# Patient Record
Sex: Female | Born: 1960
Health system: Southern US, Community
[De-identification: ages and names within clinical notes are randomized; demographics above are authoritative.]

## PROBLEM LIST (undated history)

## (undated) DIAGNOSIS — M1990 Unspecified osteoarthritis, unspecified site: Secondary | ICD-10-CM

## (undated) DIAGNOSIS — J449 Chronic obstructive pulmonary disease, unspecified: Secondary | ICD-10-CM

## (undated) DIAGNOSIS — E559 Vitamin D deficiency, unspecified: Secondary | ICD-10-CM

## (undated) DIAGNOSIS — E119 Type 2 diabetes mellitus without complications: Secondary | ICD-10-CM

## (undated) DIAGNOSIS — K219 Gastro-esophageal reflux disease without esophagitis: Secondary | ICD-10-CM

## (undated) DIAGNOSIS — D869 Sarcoidosis, unspecified: Secondary | ICD-10-CM

## (undated) DIAGNOSIS — J45909 Unspecified asthma, uncomplicated: Secondary | ICD-10-CM

## (undated) DIAGNOSIS — G5600 Carpal tunnel syndrome, unspecified upper limb: Secondary | ICD-10-CM

## (undated) DIAGNOSIS — I499 Cardiac arrhythmia, unspecified: Secondary | ICD-10-CM

## (undated) DIAGNOSIS — M48061 Spinal stenosis, lumbar region without neurogenic claudication: Secondary | ICD-10-CM

## (undated) DIAGNOSIS — G4730 Sleep apnea, unspecified: Secondary | ICD-10-CM

## (undated) DIAGNOSIS — I38 Endocarditis, valve unspecified: Secondary | ICD-10-CM

## (undated) DIAGNOSIS — E0590 Thyrotoxicosis, unspecified without thyrotoxic crisis or storm: Secondary | ICD-10-CM

## (undated) DIAGNOSIS — E785 Hyperlipidemia, unspecified: Secondary | ICD-10-CM

## (undated) DIAGNOSIS — E0500 Thyrotoxicosis with diffuse goiter without thyrotoxic crisis or storm: Secondary | ICD-10-CM

## (undated) HISTORY — DX: Unspecified osteoarthritis, unspecified site: M19.90

## (undated) HISTORY — DX: Carpal tunnel syndrome, unspecified upper limb: G56.00

## (undated) HISTORY — DX: Thyrotoxicosis with diffuse goiter without thyrotoxic crisis or storm: E05.00

## (undated) HISTORY — DX: Vitamin D deficiency, unspecified: E55.9

## (undated) HISTORY — DX: Hyperlipidemia, unspecified: E78.5

## (undated) HISTORY — DX: Unspecified asthma, uncomplicated: J45.909

## (undated) HISTORY — DX: Type 2 diabetes mellitus without complications: E11.9

## (undated) HISTORY — PX: TUBAL LIGATION: SHX77

## (undated) HISTORY — DX: Spinal stenosis, lumbar region without neurogenic claudication: M48.061

## (undated) HISTORY — DX: Endocarditis, valve unspecified: I38

## (undated) HISTORY — PX: OTHER SURGICAL HISTORY: SHX169

## (undated) HISTORY — PX: ULNAR NERVE REPAIR: SHX2594

## (undated) HISTORY — PX: SHOULDER ARTHROSCOPY: SHX128

## (undated) HISTORY — DX: Thyrotoxicosis, unspecified without thyrotoxic crisis or storm: E05.90

## (undated) SURGERY — Surgical Case
Anesthesia: *Unknown

---

## 1997-08-25 ENCOUNTER — Ambulatory Visit (HOSPITAL_COMMUNITY): Admission: RE | Admit: 1997-08-25 | Discharge: 1997-08-25 | Payer: Self-pay | Admitting: *Deleted

## 1997-10-25 ENCOUNTER — Other Ambulatory Visit: Admission: RE | Admit: 1997-10-25 | Discharge: 1997-10-25 | Payer: Self-pay | Admitting: Obstetrics and Gynecology

## 1998-10-10 ENCOUNTER — Other Ambulatory Visit: Admission: RE | Admit: 1998-10-10 | Discharge: 1998-10-10 | Payer: Self-pay | Admitting: *Deleted

## 2000-01-09 ENCOUNTER — Other Ambulatory Visit: Admission: RE | Admit: 2000-01-09 | Discharge: 2000-01-09 | Payer: Self-pay | Admitting: *Deleted

## 2000-04-06 ENCOUNTER — Encounter: Admission: RE | Admit: 2000-04-06 | Discharge: 2000-04-06 | Payer: Self-pay | Admitting: *Deleted

## 2000-04-06 ENCOUNTER — Encounter: Payer: Self-pay | Admitting: *Deleted

## 2000-05-19 ENCOUNTER — Ambulatory Visit (HOSPITAL_BASED_OUTPATIENT_CLINIC_OR_DEPARTMENT_OTHER): Admission: RE | Admit: 2000-05-19 | Discharge: 2000-05-19 | Payer: Self-pay | Admitting: Orthopaedic Surgery

## 2001-01-05 ENCOUNTER — Encounter: Admission: RE | Admit: 2001-01-05 | Discharge: 2001-01-05 | Payer: Self-pay | Admitting: Orthopedic Surgery

## 2001-01-05 ENCOUNTER — Encounter: Payer: Self-pay | Admitting: Orthopedic Surgery

## 2001-07-15 ENCOUNTER — Other Ambulatory Visit: Admission: RE | Admit: 2001-07-15 | Discharge: 2001-07-15 | Payer: Self-pay | Admitting: *Deleted

## 2001-08-31 ENCOUNTER — Ambulatory Visit (HOSPITAL_BASED_OUTPATIENT_CLINIC_OR_DEPARTMENT_OTHER): Admission: RE | Admit: 2001-08-31 | Discharge: 2001-08-31 | Payer: Self-pay | Admitting: Orthopedic Surgery

## 2002-12-21 ENCOUNTER — Encounter: Admission: RE | Admit: 2002-12-21 | Discharge: 2003-03-21 | Payer: Self-pay | Admitting: Family Medicine

## 2007-03-24 ENCOUNTER — Encounter: Admission: RE | Admit: 2007-03-24 | Discharge: 2007-03-24 | Payer: Self-pay | Admitting: Family Medicine

## 2008-09-11 ENCOUNTER — Other Ambulatory Visit: Admission: RE | Admit: 2008-09-11 | Discharge: 2008-09-11 | Payer: Self-pay | Admitting: Family Medicine

## 2010-07-21 ENCOUNTER — Encounter: Payer: Self-pay | Admitting: Family Medicine

## 2010-11-15 NOTE — Op Note (Signed)
[place]  Patient:    Lindsey Copeland, Lindsey Copeland Visit Number: [id number] MRN: [id number]          Service Type: DSU Location: [facility] Attending Physician:  Ronne Binning Dictated by:   Nicki Reaper, M.D. Proc. Date: 08/31/01 Admit Date:  08/31/2001                             Operative Report  PREOPERATIVE DIAGNOSIS:  Tardy ulnar nerve palsy, right arm.  POSTOPERATIVE DIAGNOSIS:  Tardy ulnar nerve palsy, right arm.  OPERATION:  Subcutaneous transposition, right ulnar nerve.  SURGEON:  Nicki Reaper, M.D.  ASSISTANT:  Joaquin Courts, R.N.  ANESTHESIA:  General.  ANESTHESIOLOGIST:  Cliffton Asters. Ivin Booty, M.D.  HISTORY:  The patient is a 50 year old female with a history of ulnar neuropathy on her left elbow.  This has not responded to conservative treatment.  DESCRIPTION OF PROCEDURE:  The patient was brought to the operating room where a general anesthetic was carried out without difficulty.  She was prepped and draped using Betadine scrubbing solution with the right arm free, supine position.  A sterile tourniquet placed high on the arm was inflated to 250 mmHg.  A curvilinear incision was made over the medial epicondyle and carried down through subcutaneous tissue.  Bleeders were electrocauterized. The dissection was carried down to the ulnar nerve, protecting the cutaneous branches.  The nerve was found to be extremely adherent to the fascia and muscle proximally.  With blunt and sharp dissection, this was dissected free, a fasciotomy performed to the flexor carpi ulnaris.  The medial intermuscular septum was incised, left attached at the epicondyle.  The nerve and vessels were then anteriorly transposed.  A sling was fashioned from a portion of fascia to the flexor carpi ulnaris and this was used along with the medial intermuscular septum to create a sling to hold the nerve anterior to the epicondyle.  The wound was copiously irrigated with  saline.  The subcutaneous tissue was closed with interrupted 4-0 Vicryl and the skin with a subcuticular 3-0 Monocryl suture.  Steri-Strips were applied and sterile compressive dressing and long arm splint applied.  The patient tolerated the procedure well and was taken to the recovery room for observation in satisfactory condition.  She is discharged home to return to the [facility] in one week on Vicodin and Keflex. Dictated by:   Nicki Reaper, M.D. Attending Physician:  Ronne Binning DD:  08/31/01 TD:  08/31/01 Job: [id number]

## 2010-12-16 ENCOUNTER — Other Ambulatory Visit (HOSPITAL_COMMUNITY)
Admission: RE | Admit: 2010-12-16 | Discharge: 2010-12-16 | Disposition: A | Discharge status: Home / Self Care | Payer: PRIVATE HEALTH INSURANCE | Source: Ambulatory Visit | Attending: Family Medicine | Admitting: Family Medicine

## 2010-12-16 ENCOUNTER — Other Ambulatory Visit: Payer: Self-pay | Admitting: Family Medicine

## 2010-12-16 DIAGNOSIS — Z124 Encounter for screening for malignant neoplasm of cervix: Secondary | ICD-10-CM | POA: Insufficient documentation

## 2010-12-17 ENCOUNTER — Other Ambulatory Visit: Payer: Self-pay | Admitting: Family Medicine

## 2010-12-17 DIAGNOSIS — Z1231 Encounter for screening mammogram for malignant neoplasm of breast: Secondary | ICD-10-CM

## 2010-12-30 ENCOUNTER — Ambulatory Visit
Admission: RE | Admit: 2010-12-30 | Discharge: 2010-12-30 | Disposition: A | Discharge status: Home / Self Care | Payer: PRIVATE HEALTH INSURANCE | Source: Ambulatory Visit | Attending: Family Medicine | Admitting: Family Medicine

## 2010-12-30 DIAGNOSIS — Z1231 Encounter for screening mammogram for malignant neoplasm of breast: Secondary | ICD-10-CM

## 2011-12-12 ENCOUNTER — Other Ambulatory Visit: Payer: Self-pay | Admitting: Internal Medicine

## 2011-12-12 DIAGNOSIS — E0500 Thyrotoxicosis with diffuse goiter without thyrotoxic crisis or storm: Secondary | ICD-10-CM

## 2011-12-23 ENCOUNTER — Ambulatory Visit (HOSPITAL_COMMUNITY): Payer: PRIVATE HEALTH INSURANCE

## 2011-12-24 ENCOUNTER — Other Ambulatory Visit (HOSPITAL_COMMUNITY): Payer: PRIVATE HEALTH INSURANCE

## 2011-12-29 ENCOUNTER — Ambulatory Visit (HOSPITAL_COMMUNITY): Payer: PRIVATE HEALTH INSURANCE

## 2012-01-06 ENCOUNTER — Other Ambulatory Visit: Payer: Self-pay | Admitting: Internal Medicine

## 2012-01-06 DIAGNOSIS — E0500 Thyrotoxicosis with diffuse goiter without thyrotoxic crisis or storm: Secondary | ICD-10-CM

## 2012-01-06 DIAGNOSIS — E0590 Thyrotoxicosis, unspecified without thyrotoxic crisis or storm: Secondary | ICD-10-CM | POA: Insufficient documentation

## 2012-01-08 ENCOUNTER — Encounter (HOSPITAL_COMMUNITY)
Admission: RE | Admit: 2012-01-08 | Discharge: 2012-01-08 | Disposition: A | Discharge status: Home / Self Care | Payer: PRIVATE HEALTH INSURANCE | Source: Ambulatory Visit | Attending: Internal Medicine | Admitting: Internal Medicine

## 2012-01-08 DIAGNOSIS — E0500 Thyrotoxicosis with diffuse goiter without thyrotoxic crisis or storm: Secondary | ICD-10-CM | POA: Insufficient documentation

## 2012-01-09 ENCOUNTER — Encounter (HOSPITAL_COMMUNITY)
Admission: RE | Admit: 2012-01-09 | Discharge: 2012-01-09 | Disposition: A | Discharge status: Home / Self Care | Payer: PRIVATE HEALTH INSURANCE | Source: Ambulatory Visit | Attending: Internal Medicine | Admitting: Internal Medicine

## 2012-01-09 MED ORDER — SODIUM IODIDE I 131 CAPSULE
15.7000 | Freq: Once | INTRAVENOUS | Status: AC | PRN
  Administered 2012-01-08: 15.7 via ORAL

## 2012-01-09 MED ORDER — SODIUM PERTECHNETATE TC 99M INJECTION
10.0000 | Freq: Once | INTRAVENOUS | Status: AC | PRN
  Administered 2012-01-09: 10 via INTRAVENOUS

## 2012-01-15 ENCOUNTER — Encounter (HOSPITAL_COMMUNITY)
Admission: RE | Admit: 2012-01-15 | Discharge: 2012-01-15 | Disposition: A | Discharge status: Home / Self Care | Payer: PRIVATE HEALTH INSURANCE | Source: Ambulatory Visit | Attending: Internal Medicine | Admitting: Internal Medicine

## 2012-01-15 DIAGNOSIS — E0500 Thyrotoxicosis with diffuse goiter without thyrotoxic crisis or storm: Secondary | ICD-10-CM | POA: Insufficient documentation

## 2012-01-15 MED ORDER — SODIUM IODIDE I 131 CAPSULE
36.0000 | Freq: Once | INTRAVENOUS | Status: AC | PRN
  Administered 2012-01-15: 36 via ORAL

## 2012-06-09 ENCOUNTER — Other Ambulatory Visit: Payer: Self-pay | Admitting: Family Medicine

## 2012-06-09 DIAGNOSIS — Z1231 Encounter for screening mammogram for malignant neoplasm of breast: Secondary | ICD-10-CM

## 2012-07-05 ENCOUNTER — Ambulatory Visit
Admission: RE | Admit: 2012-07-05 | Discharge: 2012-07-05 | Disposition: A | Discharge status: Home / Self Care | Payer: PRIVATE HEALTH INSURANCE | Source: Ambulatory Visit | Attending: Family Medicine | Admitting: Family Medicine

## 2012-07-05 DIAGNOSIS — Z1231 Encounter for screening mammogram for malignant neoplasm of breast: Secondary | ICD-10-CM

## 2012-07-23 ENCOUNTER — Ambulatory Visit (INDEPENDENT_AMBULATORY_CARE_PROVIDER_SITE_OTHER): Payer: PRIVATE HEALTH INSURANCE | Admitting: Critical Care Medicine

## 2012-07-23 ENCOUNTER — Encounter: Payer: Self-pay | Admitting: Critical Care Medicine

## 2012-07-23 ENCOUNTER — Other Ambulatory Visit (INDEPENDENT_AMBULATORY_CARE_PROVIDER_SITE_OTHER): Payer: PRIVATE HEALTH INSURANCE

## 2012-07-23 VITALS — BP 110/80 | HR 92 | Temp 98.3°F | Ht 62.5 in | Wt 198.0 lb

## 2012-07-23 DIAGNOSIS — R590 Localized enlarged lymph nodes: Secondary | ICD-10-CM

## 2012-07-23 DIAGNOSIS — D862 Sarcoidosis of lung with sarcoidosis of lymph nodes: Secondary | ICD-10-CM | POA: Insufficient documentation

## 2012-07-23 DIAGNOSIS — R599 Enlarged lymph nodes, unspecified: Secondary | ICD-10-CM

## 2012-07-23 DIAGNOSIS — J441 Chronic obstructive pulmonary disease with (acute) exacerbation: Secondary | ICD-10-CM

## 2012-07-23 LAB — COMPREHENSIVE METABOLIC PANEL
ALT: 17 U/L (ref 0–35)
CO2: 32 mEq/L (ref 19–32)
Calcium: 9.9 mg/dL (ref 8.4–10.5)
Chloride: 101 mEq/L (ref 96–112)
Creatinine, Ser: 0.7 mg/dL (ref 0.4–1.2)
GFR: 105.53 mL/min (ref >60.00)
Glucose, Bld: 108 mg/dL — ABNORMAL HIGH (ref 70–99)
Total Protein: 8.2 g/dL (ref 6.0–8.3)

## 2012-07-23 MED ORDER — BUDESONIDE-FORMOTEROL FUMARATE 160-4.5 MCG/ACT IN AERO: 2.0000 | INHALATION_SPRAY | Freq: Two times a day (BID) | RESPIRATORY_TRACT | Status: DC

## 2012-07-23 MED ORDER — OMEPRAZOLE 20 MG PO CPDR: 20.0000 mg | DELAYED_RELEASE_CAPSULE | Freq: Every day | ORAL | Status: DC

## 2012-07-23 MED ORDER — PREDNISONE 10 MG PO TABS: ORAL_TABLET | ORAL | Status: DC

## 2012-07-23 MED ORDER — OMEGA-3 FATTY ACIDS 1000 MG PO CAPS: ORAL_CAPSULE | ORAL | Status: DC

## 2012-07-23 MED ORDER — FLUTICASONE PROPIONATE 50 MCG/ACT NA SUSP: 2.0000 | Freq: Every day | NASAL | Status: DC

## 2012-07-23 NOTE — Patient Instructions (Addendum)
Start Symbicort two puff twice daily Use albuterol as needed Prednisone 10mg  Take 4 for three days 3 for three days 2 for three days 1 for three days and stop Omeprazole one daily 1/2 hour before breakfast then eat Follow strict reflux diet Use Delsym OTC for cough control 5-10 ML three times daily as needed Labs today Chest CT scan with contrast will be scheduled Pulmonary functions will be scheduled later in Feb after the 10th. Return 2 weeks for recheck, we may schedule a bronchoscopy sooner , I will call with the other results

## 2012-07-23 NOTE — Progress Notes (Signed)
Subjective:    Patient ID: Lindsey Copeland, female    DOB: Apr 18, 1961, 52 y.o.   MRN: [id number]  HPI Comments: Hx of dyspnea for three weeks and getting worse.  Pt also notes cough. Cough is productive of min mucus. Hard to raise mucus.  Pt notes pndrip. Pt notes dyspnea exertion and rest  Shortness of Breath This is a new problem. The current episode started 1 to 4 weeks ago. The problem occurs constantly. The problem has been rapidly worsening. Associated symptoms include abdominal pain, chest pain, ear pain, a fever, headaches, orthopnea, sputum production and wheezing. Pertinent negatives include no claudication, coryza, hemoptysis, leg pain, leg swelling, neck pain, PND, rash, rhinorrhea, sore throat, swollen glands, syncope or vomiting. 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 Associated symptoms comments: Chest pain is sharp pain.  Notes joint pain in knees  dysphagia. Risk factors include smoking. She has tried beta agonist inhalers for the symptoms. The treatment provided moderate relief. Her past medical history is significant for asthma. There is no history of allergies, bronchiolitis, CAD, chronic lung disease, COPD, DVT, a heart failure, PE, pneumonia or a recent surgery. (Asthma as child , grew out of this age 64)  Cough This is a new problem. The current episode started 1 to 4 weeks ago. The problem has been rapidly worsening. The problem occurs every few minutes. The cough is productive of sputum (white mucus). Associated symptoms include chest pain, ear congestion, ear pain, a fever, headaches, heartburn, postnasal drip, shortness of breath and wheezing. Pertinent negatives include no chills, eye redness, hemoptysis, nasal congestion, rash, rhinorrhea, sore throat, sweats or weight loss. The symptoms are aggravated by fumes, exercise, cold air and lying down. She has  tried a beta-agonist inhaler for the symptoms. The treatment provided moderate relief. Her past medical history is significant for asthma. There is no history of bronchiectasis, bronchitis, COPD, emphysema, environmental allergies or pneumonia. asthma as child , grew out of this age 5   Referred for abn CXR.    Review of Systems  Constitutional: Positive for fever. Negative for chills, weight loss and unexpected weight change.  HENT: Positive for ear pain, congestion, trouble swallowing and postnasal drip. Negative for nosebleeds, sore throat, rhinorrhea, sneezing, neck pain, dental problem and sinus pressure.   Eyes: Negative for redness and itching.  Respiratory: Positive for cough, sputum production, chest tightness, shortness of breath and wheezing. Negative for hemoptysis.   Cardiovascular: Positive for chest pain and orthopnea. Negative for palpitations, claudication, leg swelling, syncope and PND.  Gastrointestinal: Positive for heartburn and abdominal pain. Negative for nausea and vomiting.  Genitourinary: Negative for dysuria.  Musculoskeletal: Positive for arthralgias. Negative for joint swelling.  Skin: Negative for rash.  Neurological: Positive for headaches.  Hematological: Negative for environmental allergies. Does not bruise/bleed easily.  Psychiatric/Behavioral: Negative for dysphoric mood. The patient is nervous/anxious.        Objective:   Physical Exam Filed Vitals:   07/23/12 1619  BP: 110/80  Pulse: 92  Temp: 98.3 F (36.8 C)  TempSrc: Oral  Height: 5' 2.5" (1.588 m)  Weight: 89.812 kg (198 lb)  SpO2: 94%    Gen: Pleasant, well-nourished, in no distress,  normal affect  ENT: No lesions,  mouth clear,  oropharynx clear, no postnasal drip  Neck: No JVD, no TMG, no carotid bruits  Lungs: No use of accessory muscles, no dullness to percussion,exp wheezes.  Cardiovascular: RRR, heart sounds normal, no murmur or gallops, no peripheral edema  Abdomen: soft  and NT, no HSM,  BS normal  Musculoskeletal: No deformities, no cyanosis or clubbing  Neuro: alert, non focal  Skin: Warm, no lesions or rashes  CXR/Labs all reviewed        Assessment & Plan:   Obstructive chronic bronchitis with exacerbation Airway obstruction on exam with bilateral hilar and mediastinal Lymphadenopathy SUspect sarcoidosis and airway inflammation. Exacerbated by GERD. Plan Start Symbicort two puff twice daily Use albuterol as needed Prednisone 10mg  Take 4 for three days 3 for three days 2 for three days 1 for three days and stop Omeprazole one daily 1/2 hour before breakfast then eat Follow strict reflux diet Use Delsym OTC for cough control 5-10 ML three times daily as needed Labs today Chest CT scan with contrast will be scheduled Pulmonary functions will be scheduled later in Feb after the 10th. Return 2 weeks for recheck, we may schedule a bronchoscopy sooner , I will call with the other results   Bilateral hilar adenopathy syndrome Hilar/mediastinal Lymphadenopathy. Suspect Sarcoidosis Review obstructive assessment   Updated Medication List Outpatient Encounter Prescriptions as of 07/23/2012  Medication Sig Dispense Refill  . albuterol (PROVENTIL HFA;VENTOLIN HFA) 108 (90 BASE) MCG/ACT inhaler Inhale 2 puffs into the lungs every 6 (six) hours as needed.      Marland Kitchen atorvastatin (LIPITOR) 80 MG tablet Take 80 mg by mouth daily.      . fish oil-omega-3 fatty acids 1000 MG capsule HOLD      . ibuprofen (ADVIL,MOTRIN) 200 MG tablet Take 200 mg by mouth every 6 (six) hours as needed.      . metFORMIN (GLUCOPHAGE) 500 MG tablet Take 500 mg by mouth 2 (two) times daily.      . Multiple Vitamin (MULTIVITAMIN) tablet Take 1 tablet by mouth daily.      . [DISCONTINUED] fish oil-omega-3 fatty acids 1000 MG capsule Take 1 g by mouth daily.      . budesonide-formoterol (SYMBICORT) 160-4.5 MCG/ACT inhaler Inhale 2 puffs into the lungs 2 (two) times daily.  1 Inhaler   12  . fluticasone (FLONASE) 50 MCG/ACT nasal spray Place 2 sprays into the nose daily.  16 g  12  . omeprazole (PRILOSEC) 20 MG capsule Take 1 capsule (20 mg total) by mouth daily.  30 capsule  11  . predniSONE (DELTASONE) 10 MG tablet Take 4 for three days 3 for three days 2 for three days 1 for three days and stop  30 tablet  0

## 2012-07-25 NOTE — Assessment & Plan Note (Signed)
Hilar/mediastinal Lymphadenopathy. Suspect Sarcoidosis Review obstructive assessment

## 2012-07-25 NOTE — Assessment & Plan Note (Signed)
Airway obstruction on exam with bilateral hilar and mediastinal Lymphadenopathy SUspect sarcoidosis and airway inflammation. Exacerbated by GERD. Plan Start Symbicort two puff twice daily Use albuterol as needed Prednisone 10mg  Take 4 for three days 3 for three days 2 for three days 1 for three days and stop Omeprazole one daily 1/2 hour before breakfast then eat Follow strict reflux diet Use Delsym OTC for cough control 5-10 ML three times daily as needed Labs today Chest CT scan with contrast will be scheduled Pulmonary functions will be scheduled later in Feb after the 10th. Return 2 weeks for recheck, we may schedule a bronchoscopy sooner , I will call with the other results

## 2012-07-26 LAB — ALLERGY FULL PROFILE
Allergen, D pternoyssinus,d7: <0.1 kU/L
Allergen,Goose feathers, e70: <0.1 kU/L
Bahia Grass: <0.1 kU/L
Box Elder IgE: <0.1 kU/L
Common Ragweed: <0.1 kU/L
D. farinae: <0.1 kU/L
Elm IgE: <0.1 kU/L
G005 Rye, Perennial: <0.1 kU/L
G009 Red Top: <0.1 kU/L
House Dust Hollister: <0.1 kU/L
IgE (Immunoglobulin E), Serum: 21 IU/mL (ref 0.0–180.0)
Oak: <0.1 kU/L
Stemphylium Botryosum: <0.1 kU/L
Timothy Grass: <0.1 kU/L

## 2012-07-28 ENCOUNTER — Telehealth: Payer: Self-pay | Admitting: Critical Care Medicine

## 2012-07-28 ENCOUNTER — Ambulatory Visit (INDEPENDENT_AMBULATORY_CARE_PROVIDER_SITE_OTHER)
Admission: RE | Admit: 2012-07-28 | Discharge: 2012-07-28 | Disposition: A | Discharge status: Home / Self Care | Payer: PRIVATE HEALTH INSURANCE | Source: Ambulatory Visit | Attending: Critical Care Medicine | Admitting: Critical Care Medicine

## 2012-07-28 ENCOUNTER — Other Ambulatory Visit: Payer: Self-pay | Admitting: Critical Care Medicine

## 2012-07-28 DIAGNOSIS — R590 Localized enlarged lymph nodes: Secondary | ICD-10-CM

## 2012-07-28 DIAGNOSIS — R599 Enlarged lymph nodes, unspecified: Secondary | ICD-10-CM

## 2012-07-28 DIAGNOSIS — D869 Sarcoidosis, unspecified: Secondary | ICD-10-CM

## 2012-07-28 MED ORDER — IOHEXOL 300 MG/ML  SOLN
80.0000 mL | Freq: Once | INTRAMUSCULAR | Status: AC | PRN
  Administered 2012-07-28: 80 mL via INTRAVENOUS

## 2012-07-28 NOTE — Progress Notes (Signed)
Quick Note:  Called, spoke with pt. Informed her of lab results per Dr. Delford Field. She verbalized understanding of this. ______

## 2012-07-28 NOTE — Progress Notes (Signed)
Quick Note:  Called, spoke with family Member. Was advised pt is not at home now but will inform her we called.  Called - [email or web address] on pt's cell #. ______

## 2012-07-28 NOTE — Telephone Encounter (Signed)
I called the pt and told her CT results I have scheduled her for 9AM 07/30/12  Please call the pt back and tell her particulars re : bronch on Friday. Arrive 8AM   NPO after midnight etc.

## 2012-07-28 NOTE — Telephone Encounter (Signed)
Called, spoke with pt.  Informed bronch is scheduled for 07/30/12 at 9am at [facility].  Needs to arrive at 8 am and be NPO after midnight.  She verbalized understanding of this and voiced no further questions or concerns at this time.

## 2012-07-28 NOTE — Telephone Encounter (Signed)
PW is currently speaking with pt regarding this. Will sign off message

## 2012-07-29 ENCOUNTER — Encounter (HOSPITAL_COMMUNITY): Payer: Self-pay | Admitting: Radiology

## 2012-07-30 ENCOUNTER — Ambulatory Visit (HOSPITAL_COMMUNITY)
Admission: RE | Admit: 2012-07-30 | Discharge: 2012-07-30 | Disposition: A | Discharge status: Home / Self Care | Payer: PRIVATE HEALTH INSURANCE | Source: Ambulatory Visit | Attending: Critical Care Medicine | Admitting: Critical Care Medicine

## 2012-07-30 ENCOUNTER — Ambulatory Visit (HOSPITAL_COMMUNITY): Payer: PRIVATE HEALTH INSURANCE

## 2012-07-30 ENCOUNTER — Encounter (HOSPITAL_COMMUNITY): Payer: Self-pay | Admitting: Radiology

## 2012-07-30 ENCOUNTER — Encounter (HOSPITAL_COMMUNITY)
Admission: RE | Disposition: A | Discharge status: Home / Self Care | Payer: Self-pay | Source: Ambulatory Visit | Attending: Critical Care Medicine

## 2012-07-30 DIAGNOSIS — R599 Enlarged lymph nodes, unspecified: Secondary | ICD-10-CM | POA: Insufficient documentation

## 2012-07-30 DIAGNOSIS — D862 Sarcoidosis of lung with sarcoidosis of lymph nodes: Secondary | ICD-10-CM

## 2012-07-30 DIAGNOSIS — J441 Chronic obstructive pulmonary disease with (acute) exacerbation: Secondary | ICD-10-CM | POA: Insufficient documentation

## 2012-07-30 DIAGNOSIS — K219 Gastro-esophageal reflux disease without esophagitis: Secondary | ICD-10-CM | POA: Insufficient documentation

## 2012-07-30 DIAGNOSIS — Z79899 Other long term (current) drug therapy: Secondary | ICD-10-CM | POA: Insufficient documentation

## 2012-07-30 DIAGNOSIS — J99 Respiratory disorders in diseases classified elsewhere: Secondary | ICD-10-CM

## 2012-07-30 DIAGNOSIS — D869 Sarcoidosis, unspecified: Secondary | ICD-10-CM

## 2012-07-30 HISTORY — PX: VIDEO BRONCHOSCOPY: SHX5072

## 2012-07-30 LAB — GLUCOSE, CAPILLARY

## 2012-07-30 SURGERY — BRONCHOSCOPY, WITH FLUOROSCOPY
Anesthesia: Moderate Sedation

## 2012-07-30 MED ORDER — LIDOCAINE HCL 2 % EX GEL
CUTANEOUS | Status: DC | PRN
  Administered 2012-07-30: 1

## 2012-07-30 MED ORDER — MIDAZOLAM HCL 10 MG/2ML IJ SOLN
INTRAMUSCULAR | Status: AC
  Filled 2012-07-30: qty 4

## 2012-07-30 MED ORDER — FENTANYL CITRATE 0.05 MG/ML IJ SOLN
INTRAMUSCULAR | Status: AC
  Filled 2012-07-30: qty 4

## 2012-07-30 MED ORDER — PHENYLEPHRINE HCL 0.25 % NA SOLN
NASAL | Status: DC | PRN
  Administered 2012-07-30: 2 via NASAL

## 2012-07-30 MED ORDER — LIDOCAINE HCL 1 % IJ SOLN
INTRAMUSCULAR | Status: DC | PRN
  Administered 2012-07-30: 6 mL via RESPIRATORY_TRACT

## 2012-07-30 MED ORDER — FENTANYL CITRATE 0.05 MG/ML IJ SOLN
INTRAMUSCULAR | Status: DC | PRN
  Administered 2012-07-30: 40 ug via INTRAVENOUS

## 2012-07-30 MED ORDER — MIDAZOLAM HCL 10 MG/2ML IJ SOLN
INTRAMUSCULAR | Status: DC | PRN
  Administered 2012-07-30: 2 mg via INTRAVENOUS
  Administered 2012-07-30: 3 mg via INTRAVENOUS

## 2012-07-30 NOTE — Op Note (Signed)
Bronchoscopy Procedure Note  Date of Operation: 07/30/2012  Pre-op Diagnosis: Hilar lymphadenopathy  Post-op Diagnosis: probable sarcoidosis  Surgeon: Shan Levans  Anesthesia: Monitored Local Anesthesia with Sedation    Fentanyl IV    Versed 5 mg IV   Operation: Flexible fiberoptic bronchoscopy, diagnostic   Findings: Cobble stoned airway inflammation c/w sarcoidosis  Specimen: TBBx RLL superior segment ; Bronch washing RLL   Estimated Blood Loss: Minimal  Complications: none  Indications and History: The patient is a 52 y.o. female with hilar lymphadenopathy.  The risks, benefits, complications, treatment options and expected outcomes were discussed with the patient.  The possibilities of reaction to medication, pulmonary aspiration, perforation of a viscus, bleeding, failure to diagnose a condition and creating a complication requiring transfusion or operation were discussed with the patient who freely signed the consent.    Description of Procedure: The patient was re-examined in the bronchoscopy suite and the site of surgery properly noted/marked.  The patient was identified as Lindsey Copeland and the procedure verified as Flexible Fiberoptic Bronchoscopy.  A Time Out was held and the above information confirmed.   After the induction of topical nasopharyngeal anesthesia, the patient was positioned  and the bronchoscope was passed through the R  nares. The vocal cords were visualized and  1% buffered lidocaine 5 ml was topically placed onto the cords. The cords were normal. The scope was then passed into the trachea.  1% buffered lidocaine 5 ml was used topically on the carina.  Careful inspection of the tracheal lumen was accomplished. The scope was sequentially passed into the left main and then left upper and lower bronchi and segmental bronchi.    The scope was then withdrawn and advanced into the right main bronchus and then into the RUL, RML, and RLL bronchi and  segmental bronchi.   RLL TBBx and Bronchial washing  were done and there was two specimens.   Endobronchial findings: cobblestoning of airway with airway inflammation but no other endobronchial lesions.  No secretions. Trachea: Edematous mucosa Carina: Edematous mucosa Right main bronchus: Edematous mucosa Right upper lobe bronchus: Edematous mucosa Right middle lobe bronchus: Edematous mucosa Right lower lobe bronchus: Edematous mucosa Left main bronchus: Edematous mucosa Left upper lobe bronchus: Edematous mucosa Left lower lobe bronchus: Edematous mucosa  The Patient was taken to the Endoscopy Recovery area in satisfactory condition.  Attestation: I performed the procedure.  Dorcas Carrow Beeper  [phone]  Cell  [phone]  If no response or cell goes to voicemail, call beeper [phone]

## 2012-07-30 NOTE — H&P (View-Only) (Signed)
Subjective:    Patient ID: Lindsey Copeland, female    DOB: 01/28/1961, 51 y.o.   MRN: [id number]  HPI Comments: Hx of dyspnea for three weeks and getting worse.  Pt also notes cough. Cough is productive of min mucus. Hard to raise mucus.  Pt notes pndrip. Pt notes dyspnea exertion and rest  Shortness of Breath This is a new problem. The current episode started 1 to 4 weeks ago. The problem occurs constantly. The problem has been rapidly worsening. Associated symptoms include abdominal pain, chest pain, ear pain, a fever, headaches, orthopnea, sputum production and wheezing. Pertinent negatives include no claudication, coryza, hemoptysis, leg pain, leg swelling, neck pain, PND, rash, rhinorrhea, sore throat, swollen glands, syncope or vomiting. 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 Associated symptoms comments: Chest pain is sharp pain.  Notes joint pain in knees  dysphagia. Risk factors include smoking. She has tried beta agonist inhalers for the symptoms. The treatment provided moderate relief. Her past medical history is significant for asthma. There is no history of allergies, bronchiolitis, CAD, chronic lung disease, COPD, DVT, a heart failure, PE, pneumonia or a recent surgery. (Asthma as child , grew out of this age 7)  Cough This is a new problem. The current episode started 1 to 4 weeks ago. The problem has been rapidly worsening. The problem occurs every few minutes. The cough is productive of sputum (white mucus). Associated symptoms include chest pain, ear congestion, ear pain, a fever, headaches, heartburn, postnasal drip, shortness of breath and wheezing. Pertinent negatives include no chills, eye redness, hemoptysis, nasal congestion, rash, rhinorrhea, sore throat, sweats or weight loss. The symptoms are aggravated by fumes, exercise, cold air and lying down. She has  tried a beta-agonist inhaler for the symptoms. The treatment provided moderate relief. Her past medical history is significant for asthma. There is no history of bronchiectasis, bronchitis, COPD, emphysema, environmental allergies or pneumonia. asthma as child , grew out of this age 7   Referred for abn CXR.    Review of Systems  Constitutional: Positive for fever. Negative for chills, weight loss and unexpected weight change.  HENT: Positive for ear pain, congestion, trouble swallowing and postnasal drip. Negative for nosebleeds, sore throat, rhinorrhea, sneezing, neck pain, dental problem and sinus pressure.   Eyes: Negative for redness and itching.  Respiratory: Positive for cough, sputum production, chest tightness, shortness of breath and wheezing. Negative for hemoptysis.   Cardiovascular: Positive for chest pain and orthopnea. Negative for palpitations, claudication, leg swelling, syncope and PND.  Gastrointestinal: Positive for heartburn and abdominal pain. Negative for nausea and vomiting.  Genitourinary: Negative for dysuria.  Musculoskeletal: Positive for arthralgias. Negative for joint swelling.  Skin: Negative for rash.  Neurological: Positive for headaches.  Hematological: Negative for environmental allergies. Does not bruise/bleed easily.  Psychiatric/Behavioral: Negative for dysphoric mood. The patient is nervous/anxious.        Objective:   Physical Exam Filed Vitals:   07/23/12 1619  BP: 110/80  Pulse: 92  Temp: 98.3 F (36.8 C)  TempSrc: Oral  Height: 5' 2.5" (1.588 m)  Weight: 89.812 kg (198 lb)  SpO2: 94%    Gen: Pleasant, well-nourished, in no distress,  normal affect  ENT: No lesions,  mouth clear,  oropharynx clear, no postnasal drip  Neck: No JVD, no TMG, no carotid bruits  Lungs: No use of accessory muscles, no dullness to percussion,exp wheezes.    Cardiovascular: RRR, heart sounds normal, no murmur or gallops, no peripheral edema  Abdomen: soft  and NT, no HSM,  BS normal  Musculoskeletal: No deformities, no cyanosis or clubbing  Neuro: alert, non focal  Skin: Warm, no lesions or rashes  CXR/Labs all reviewed        Assessment & Plan:   Obstructive chronic bronchitis with exacerbation Airway obstruction on exam with bilateral hilar and mediastinal Lymphadenopathy SUspect sarcoidosis and airway inflammation. Exacerbated by GERD. Plan Start Symbicort two puff twice daily Use albuterol as needed Prednisone 10mg Take 4 for three days 3 for three days 2 for three days 1 for three days and stop Omeprazole one daily 1/2 hour before breakfast then eat Follow strict reflux diet Use Delsym OTC for cough control 5-10 ML three times daily as needed Labs today Chest CT scan with contrast will be scheduled Pulmonary functions will be scheduled later in Feb after the 10th. Return 2 weeks for recheck, we may schedule a bronchoscopy sooner , I will call with the other results   Bilateral hilar adenopathy syndrome Hilar/mediastinal Lymphadenopathy. Suspect Sarcoidosis Review obstructive assessment   Updated Medication List Outpatient Encounter Prescriptions as of 07/23/2012  Medication Sig Dispense Refill  . albuterol (PROVENTIL HFA;VENTOLIN HFA) 108 (90 BASE) MCG/ACT inhaler Inhale 2 puffs into the lungs every 6 (six) hours as needed.      . atorvastatin (LIPITOR) 80 MG tablet Take 80 mg by mouth daily.      . fish oil-omega-3 fatty acids 1000 MG capsule HOLD      . ibuprofen (ADVIL,MOTRIN) 200 MG tablet Take 200 mg by mouth every 6 (six) hours as needed.      . metFORMIN (GLUCOPHAGE) 500 MG tablet Take 500 mg by mouth 2 (two) times daily.      . Multiple Vitamin (MULTIVITAMIN) tablet Take 1 tablet by mouth daily.      . [DISCONTINUED] fish oil-omega-3 fatty acids 1000 MG capsule Take 1 g by mouth daily.      . budesonide-formoterol (SYMBICORT) 160-4.5 MCG/ACT inhaler Inhale 2 puffs into the lungs 2 (two) times daily.  1 Inhaler   12  . fluticasone (FLONASE) 50 MCG/ACT nasal spray Place 2 sprays into the nose daily.  16 g  12  . omeprazole (PRILOSEC) 20 MG capsule Take 1 capsule (20 mg total) by mouth daily.  30 capsule  11  . predniSONE (DELTASONE) 10 MG tablet Take 4 for three days 3 for three days 2 for three days 1 for three days and stop  30 tablet  0      

## 2012-07-30 NOTE — Interval H&P Note (Signed)
Pt seen and examined. There are no changes in history and physical exam. The pt is ready for moderate conscious sedation and bronchoscopy. Dorcas Carrow Beeper  [phone]  Cell  [phone]  If no response or cell goes to voicemail, call beeper [phone]

## 2012-08-01 LAB — CULTURE, RESPIRATORY W GRAM STAIN

## 2012-08-02 ENCOUNTER — Encounter (HOSPITAL_COMMUNITY): Payer: Self-pay | Admitting: Critical Care Medicine

## 2012-08-02 LAB — HYPERSENSITIVITY PNUEMONITIS PROFILE

## 2012-08-02 LAB — FUNGAL ANTIBODIES PANEL, ID-BLOOD
Blastomyces Abs, Qn, DID: NEGATIVE
Histoplasma Antibody, ID: NEGATIVE

## 2012-08-03 ENCOUNTER — Telehealth: Payer: Self-pay | Admitting: Critical Care Medicine

## 2012-08-03 NOTE — Telephone Encounter (Signed)
Spoke with patient made her aware of results/recs as listed below per Dr. Delford Field. Patient is scheduled to see Dr. Delford Field 08/11/12 @9am . Patient originally scheduled to have PFTs on 08/16/12, but I have rescheduled to 08/04/12@1pm  so they will be completed for OV. Nothing further needed at this time.

## 2012-08-03 NOTE — Telephone Encounter (Signed)
Pt returned call from Dr. Delford Field. Lindsey Copeland

## 2012-08-03 NOTE — Telephone Encounter (Signed)
I called pt re results of bronchoscopy and left a message.  Noone home, left a message. Bronch Not diagnostic but suggestive of sarcoidosis but ACE level high supports diagnosis of sarcoidosis as does all imaging studies.  She needs to make another office visit so we can go over all results.  She needs to finish prednisone and stay on symbicort  Keep PFT appt

## 2012-08-04 ENCOUNTER — Ambulatory Visit (INDEPENDENT_AMBULATORY_CARE_PROVIDER_SITE_OTHER): Payer: PRIVATE HEALTH INSURANCE | Admitting: Critical Care Medicine

## 2012-08-04 DIAGNOSIS — D862 Sarcoidosis of lung with sarcoidosis of lymph nodes: Secondary | ICD-10-CM

## 2012-08-04 DIAGNOSIS — J99 Respiratory disorders in diseases classified elsewhere: Secondary | ICD-10-CM

## 2012-08-04 DIAGNOSIS — D869 Sarcoidosis, unspecified: Secondary | ICD-10-CM

## 2012-08-04 LAB — PULMONARY FUNCTION TEST

## 2012-08-04 LAB — LEGIONELLA CULTURE: Special Requests: NORMAL

## 2012-08-04 NOTE — Progress Notes (Signed)
PFT done today. 

## 2012-08-09 ENCOUNTER — Encounter: Payer: Self-pay | Admitting: Critical Care Medicine

## 2012-08-11 ENCOUNTER — Encounter: Payer: Self-pay | Admitting: Critical Care Medicine

## 2012-08-11 ENCOUNTER — Ambulatory Visit (INDEPENDENT_AMBULATORY_CARE_PROVIDER_SITE_OTHER): Payer: PRIVATE HEALTH INSURANCE | Admitting: Critical Care Medicine

## 2012-08-11 VITALS — BP 112/74 | HR 78 | Temp 98.5°F | Ht 62.5 in | Wt 197.0 lb

## 2012-08-11 DIAGNOSIS — D869 Sarcoidosis, unspecified: Secondary | ICD-10-CM

## 2012-08-11 DIAGNOSIS — D862 Sarcoidosis of lung with sarcoidosis of lymph nodes: Secondary | ICD-10-CM

## 2012-08-11 DIAGNOSIS — J99 Respiratory disorders in diseases classified elsewhere: Secondary | ICD-10-CM

## 2012-08-11 NOTE — Patient Instructions (Signed)
Stay on symbicort  No further prednisone Return 4 months

## 2012-08-11 NOTE — Assessment & Plan Note (Signed)
Sarcoidosis with airway obstruction improved with corticosteroids and Symbicort Plan Continue Symbicort 2 puff twice daily Taper prednisone to off Reflux treatment with proton pump inhibitor and diet to continue Return form a

## 2012-08-11 NOTE — Progress Notes (Signed)
Subjective:    Patient ID: Lindsey Copeland, female    DOB: Nov 06, 1960, 52 y.o.   MRN: [id number]  HPI Comments: Hx of dyspnea for three weeks and getting worse.  Pt also notes cough. Cough is productive of min mucus. Hard to raise mucus.  Pt notes pndrip. Pt notes dyspnea exertion and rest  Shortness of Breath This is a new problem. The current episode started 1 to 4 weeks ago. The problem occurs constantly. The problem has been rapidly worsening. Associated symptoms include abdominal pain, chest pain, ear pain, a fever, headaches, orthopnea, sputum production and wheezing. Pertinent negatives include no claudication, coryza, hemoptysis, leg pain, leg swelling, neck pain, PND, rash, rhinorrhea, sore throat, swollen glands, syncope or vomiting. 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 Associated symptoms comments: Chest pain is sharp pain.  Notes joint pain in knees  dysphagia. Risk factors include smoking. She has tried beta agonist inhalers for the symptoms. The treatment provided moderate relief. Her past medical history is significant for asthma. There is no history of allergies, bronchiolitis, CAD, chronic lung disease, COPD, DVT, a heart failure, PE, pneumonia or a recent surgery. (Asthma as child , grew out of this age 21)  Cough This is a new problem. The current episode started 1 to 4 weeks ago. The problem has been rapidly worsening. The problem occurs every few minutes. The cough is productive of sputum (white mucus). Associated symptoms include chest pain, ear congestion, ear pain, a fever, headaches, heartburn, postnasal drip, shortness of breath and wheezing. Pertinent negatives include no chills, eye redness, hemoptysis, nasal congestion, rash, rhinorrhea, sore throat, sweats or weight loss. The symptoms are aggravated by fumes, exercise, cold air and lying down. She has  tried a beta-agonist inhaler for the symptoms. The treatment provided moderate relief. Her past medical history is significant for asthma. There is no history of bronchiectasis, bronchitis, COPD, emphysema, environmental allergies or pneumonia. asthma as child , grew out of this age 62   Referred for abn CXR.  08/11/2012 At the last visit we prescribed prednisone and Symbicort. Patient with bronchoscopy which was nondiagnostic for granulomas. The patient is improved on prednisone and Symbicort at this time. Note the ACE level was elevated at 87. Pulmonary function tests showed normal diffusion capacity and total lung capacity. There was moderate peripheral airflow obstruction seen. The patient has less chest pain cough and sputum production at this time. There is less wheezing.  Review of Systems  Constitutional: Positive for fever. Negative for chills, weight loss and unexpected weight change.  HENT: Positive for ear pain, congestion, trouble swallowing and postnasal drip. Negative for nosebleeds, sore throat, rhinorrhea, sneezing, neck pain, dental problem and sinus pressure.   Eyes: Negative for redness and itching.  Respiratory: Positive for cough, sputum production, chest tightness, shortness of breath and wheezing. Negative for hemoptysis.   Cardiovascular: Positive for chest pain and orthopnea. Negative for palpitations, claudication, leg swelling, syncope and PND.  Gastrointestinal: Positive for heartburn and abdominal pain. Negative for nausea and vomiting.  Genitourinary: Negative for dysuria.  Musculoskeletal: Positive for arthralgias. Negative for joint swelling.  Skin: Negative for rash.  Allergic/Immunologic: Negative for environmental allergies.  Neurological: Positive for headaches.  Hematological: Does not bruise/bleed easily.  Psychiatric/Behavioral: Negative for dysphoric mood. The patient is nervous/anxious.        Objective:   Physical Exam  Filed Vitals:   08/11/12  1478  BP: 112/74  Pulse: 78  Temp: 98.5 F (36.9 C)  TempSrc: Oral  Height: 5' 2.5" (1.588 m)  Weight: 197 lb (89.359 kg)  SpO2: 99%    Gen: Pleasant, well-nourished, in no distress,  normal affect  ENT: No lesions,  mouth clear,  oropharynx clear, no postnasal drip  Neck: No JVD, no TMG, no carotid bruits  Lungs: No use of accessory muscles, no dullness to percussion, decreased wheezing  Cardiovascular: RRR, heart sounds normal, no murmur or gallops, no peripheral edema  Abdomen: soft and NT, no HSM,  BS normal  Musculoskeletal: No deformities, no cyanosis or clubbing  Neuro: alert, non focal  Skin: Warm, no lesions or rashes          Assessment & Plan:   No problem-specific assessment & plan notes found for this encounter.   Updated Medication List Outpatient Encounter Prescriptions as of 08/11/2012  Medication Sig Dispense Refill  . albuterol (PROVENTIL HFA;VENTOLIN HFA) 108 (90 BASE) MCG/ACT inhaler Inhale 2 puffs into the lungs every 6 (six) hours as needed.      Marland Kitchen atorvastatin (LIPITOR) 80 MG tablet Take 80 mg by mouth daily.      . budesonide-formoterol (SYMBICORT) 160-4.5 MCG/ACT inhaler Inhale 2 puffs into the lungs 2 (two) times daily.  1 Inhaler  12  . fish oil-omega-3 fatty acids 1000 MG capsule HOLD      . fluticasone (FLONASE) 50 MCG/ACT nasal spray Place 2 sprays into the nose daily.  16 g  12  . ibuprofen (ADVIL,MOTRIN) 200 MG tablet Take 200 mg by mouth every 6 (six) hours as needed.      . metFORMIN (GLUCOPHAGE) 500 MG tablet Take 500 mg by mouth 2 (two) times daily.      . Multiple Vitamin (MULTIVITAMIN) tablet Take 1 tablet by mouth daily.      Marland Kitchen omeprazole (PRILOSEC) 20 MG capsule Take 1 capsule (20 mg total) by mouth daily.  30 capsule  11  . [DISCONTINUED] predniSONE (DELTASONE) 10 MG tablet Take 4 for three days 3 for three days 2 for three days 1 for three days and stop  30 tablet  0   No facility-administered encounter medications on file  as of 08/11/2012.

## 2012-08-18 ENCOUNTER — Encounter: Payer: Self-pay | Admitting: Critical Care Medicine

## 2012-08-25 LAB — FUNGUS CULTURE W SMEAR: Fungal Smear: NONE SEEN

## 2012-09-13 LAB — AFB CULTURE WITH SMEAR (NOT AT ARMC)

## 2012-12-15 ENCOUNTER — Ambulatory Visit (INDEPENDENT_AMBULATORY_CARE_PROVIDER_SITE_OTHER): Payer: PRIVATE HEALTH INSURANCE | Admitting: Critical Care Medicine

## 2012-12-15 ENCOUNTER — Encounter: Payer: Self-pay | Admitting: Critical Care Medicine

## 2012-12-15 VITALS — BP 118/72 | HR 64 | Temp 98.2°F | Ht 62.5 in | Wt 193.8 lb

## 2012-12-15 DIAGNOSIS — D862 Sarcoidosis of lung with sarcoidosis of lymph nodes: Secondary | ICD-10-CM

## 2012-12-15 DIAGNOSIS — G4710 Hypersomnia, unspecified: Secondary | ICD-10-CM

## 2012-12-15 DIAGNOSIS — J99 Respiratory disorders in diseases classified elsewhere: Secondary | ICD-10-CM

## 2012-12-15 DIAGNOSIS — E785 Hyperlipidemia, unspecified: Secondary | ICD-10-CM

## 2012-12-15 DIAGNOSIS — E782 Mixed hyperlipidemia: Secondary | ICD-10-CM | POA: Insufficient documentation

## 2012-12-15 DIAGNOSIS — D869 Sarcoidosis, unspecified: Secondary | ICD-10-CM

## 2012-12-15 DIAGNOSIS — K219 Gastro-esophageal reflux disease without esophagitis: Secondary | ICD-10-CM

## 2012-12-15 NOTE — Patient Instructions (Addendum)
Stay on symbicort  Sleep study we will call results Stay off fish oil Ok to use flaxseed crushed on salads/meats Return 4 months

## 2012-12-15 NOTE — Progress Notes (Signed)
Subjective:    Patient ID: Lindsey Copeland, female    DOB: February 28, 1961, 52 y.o.   MRN: [id number]  HPI 12/15/2012 At last ov we rx pred and symbicort for sarcoid flare Pt notes wheezing is better.  Pt notes up stairs or walking gets dyspneic.  Staying on symbicort bid Heartburn is better. Hard time raising mucus.   Review of Systems  Constitutional: Negative for unexpected weight change.  HENT: Positive for congestion and trouble swallowing. Negative for nosebleeds, sneezing, dental problem and sinus pressure.   Eyes: Negative for redness and itching.  Respiratory: Positive for chest tightness.   Cardiovascular: Negative for palpitations.  Gastrointestinal: Negative for nausea.  Genitourinary: Negative for dysuria.  Musculoskeletal: Positive for arthralgias. Negative for joint swelling.  Hematological: Does not bruise/bleed easily.  Psychiatric/Behavioral: Negative for dysphoric mood. The patient is nervous/anxious.        Objective:   Physical Exam  Filed Vitals:   12/15/12 1432  BP: 118/72  Pulse: 64  Temp: 98.2 F (36.8 C)  TempSrc: Oral  Height: 5' 2.5" (1.588 m)  Weight: 193 lb 12.8 oz (87.907 kg)  SpO2: 99%    Gen: Pleasant, well-nourished, in no distress,  normal affect  ENT: No lesions,  mouth clear,  oropharynx clear, no postnasal drip  Neck: No JVD, no TMG, no carotid bruits  Lungs: No use of accessory muscles, no dullness to percussion, decreased wheezing  Cardiovascular: RRR, heart sounds normal, no murmur or gallops, no peripheral edema  Abdomen: soft and NT, no HSM,  BS normal  Musculoskeletal: No deformities, no cyanosis or clubbing  Neuro: alert, non focal  Skin: Warm, no lesions or rashes          Assessment & Plan:   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 Poor inhaler technique documented Plan Maintain Symbicort 2 puffs  twice a day and the patient was instructed as to proper use of the inhaler increased her ability from 25% to 75%  Stay off fish oil Ok to use flaxseed crushed on salads/meats Return 4 months  Hypersomnia Hypersomnia with associated snoring Plan Obtain sleep study     Updated Medication List Outpatient Encounter Prescriptions as of 12/15/2012  Medication Sig Dispense Refill  . albuterol (PROVENTIL HFA;VENTOLIN HFA) 108 (90 BASE) MCG/ACT inhaler Inhale 2 puffs into the lungs every 6 (six) hours as needed.      Marland Kitchen atorvastatin (LIPITOR) 80 MG tablet Take 80 mg by mouth daily.      . budesonide-formoterol (SYMBICORT) 160-4.5 MCG/ACT inhaler Inhale 2 puffs into the lungs 2 (two) times daily.  1 Inhaler  12  . fluticasone (FLONASE) 50 MCG/ACT nasal spray Place 2 sprays into the nose daily as needed.      Marland Kitchen ibuprofen (ADVIL,MOTRIN) 200 MG tablet Take 200 mg by mouth every 6 (six) hours as needed.      . metFORMIN (GLUCOPHAGE) 500 MG tablet Take 500 mg by mouth 2 (two) times daily.      . Multiple Vitamin (MULTIVITAMIN) tablet Take 1 tablet by mouth daily.      Marland Kitchen omeprazole (PRILOSEC) 20 MG capsule Take 20 mg by mouth daily as needed.      . [DISCONTINUED] fluticasone (FLONASE) 50 MCG/ACT nasal spray Place 2 sprays into the nose daily.  16 g  12  . [DISCONTINUED] omeprazole (PRILOSEC) 20 MG capsule Take 1 capsule (20 mg total) by mouth daily.  30 capsule  11  . [DISCONTINUED] fish oil-omega-3 fatty acids 1000 MG capsule HOLD       No facility-administered encounter medications on file as of 12/15/2012.

## 2012-12-15 NOTE — Assessment & Plan Note (Signed)
Hypersomnia with associated snoring Plan Obtain sleep study

## 2012-12-15 NOTE — Assessment & Plan Note (Signed)
Pulmonary sarcoidosis with lower airway obstruction and partial response to inhaled medication. Poor inhaler technique documented Plan Maintain Symbicort 2 puffs twice a day and the patient was instructed as to proper use of the inhaler increased her ability from 25% to 75%  Stay off fish oil Ok to use flaxseed crushed on salads/meats Return 4 months

## 2012-12-21 ENCOUNTER — Other Ambulatory Visit (HOSPITAL_COMMUNITY)
Admission: RE | Admit: 2012-12-21 | Discharge: 2012-12-21 | Disposition: A | Discharge status: Home / Self Care | Payer: PRIVATE HEALTH INSURANCE | Source: Ambulatory Visit | Attending: Family Medicine | Admitting: Family Medicine

## 2012-12-21 ENCOUNTER — Other Ambulatory Visit: Payer: Self-pay | Admitting: Family Medicine

## 2012-12-21 DIAGNOSIS — Z124 Encounter for screening for malignant neoplasm of cervix: Secondary | ICD-10-CM | POA: Insufficient documentation

## 2012-12-21 DIAGNOSIS — Z1151 Encounter for screening for human papillomavirus (HPV): Secondary | ICD-10-CM | POA: Insufficient documentation

## 2013-01-14 ENCOUNTER — Ambulatory Visit (HOSPITAL_BASED_OUTPATIENT_CLINIC_OR_DEPARTMENT_OTHER): Payer: PRIVATE HEALTH INSURANCE | Attending: Critical Care Medicine

## 2013-01-14 VITALS — Ht 62.0 in | Wt 190.0 lb

## 2013-01-14 DIAGNOSIS — G4710 Hypersomnia, unspecified: Secondary | ICD-10-CM

## 2013-01-14 DIAGNOSIS — G4733 Obstructive sleep apnea (adult) (pediatric): Secondary | ICD-10-CM | POA: Insufficient documentation

## 2013-01-25 DIAGNOSIS — G4733 Obstructive sleep apnea (adult) (pediatric): Secondary | ICD-10-CM

## 2013-01-26 NOTE — Procedures (Signed)
NAME:  Lindsey Copeland, Lindsey Copeland NO.:  [id number]  MEDICAL RECORD NO.:  [id number]          PATIENT TYPE:  OUT  LOCATION:  SLEEP CENTER                 FACILITY[facility]  [facility]  PHYSICIAN:  Oretha Milch, MD      DATE OF BIRTH:  April 19, 1961  DATE OF STUDY:  01/14/2013                           NOCTURNAL POLYSOMNOGRAM  REFERRING PHYSICIAN:  Charlcie Cradle. Delford Field, MD, FCCP  INDICATION FOR THE STUDY:  Ms. Inch is a 52 year old woman with sarcoidosis, diabetes, witnessed apneas, loud snoring, and excessive somnolence.  At the time of this study, she weighed 190 pounds with a height of 5 feet 2 inches, BMI of 34, neck size of 14 inches.  EPWORTH SLEEPINESS SCORE:  2.  This nocturnal polysomnogram was performed with sleep technologist in attendance.  EEG, EOG, EMG, EKG, and respiratory parameters were recorded.  Sleep stages, arousals, limb movements, and respiratory data were scored according to criteria laid out by the American Academy of Sleep Medicine.  SLEEP ARCHITECTURE:  Lights out was at 10:39 p.m.  Lights on was at 5:03 a.m.  Total sleep time was 270 minutes with a sleep period time of 379 minutes and a sleep efficiency of 70%.  Sleep latency was 5 minutes and latency of REM sleep was 163 minutes and wake after sleep onset was 109 minutes.  Sleep stages of the percentage of total sleep time was N1 5%, N2 85%, N3 0%, and REM sleep 9%.  Supine sleep accounted for 196 minutes.  Supine REM sleep was seen for 25 minutes.  Longest event sleep was around 3:00 a.m.  AROUSAL DATA:  There were 191 arousals with an arousal index of 20 events per hour.  Of these, 51 were spontaneous and the rest were associated with respiratory events.  RESPIRATORY DATA:  There were total of 14 obstructive apneas, 1 central apnea, 0 mixed apneas, and 58 hypopneas with apnea-hypopnea index of 16 events per hour, very few RERAs were noted.  The supine AHI was 15 events per hour and the nonsupine AHI was  3 events per hour.  LIMB MOVEMENT DATA:  No significant limb movements were noted.  OXYGEN SATURATION DATA:  The desaturation index was 30 events per hour. The lowest desaturation was 81%.  During REM sleep, she spent 1.5 minutes with a saturation less than 88%.  CARDIAC DATA:  The low heart rate was 41 beats per minute.  The high heart rate recorded was an artifact.  No arrhythmias were noted.  DISCUSSION:  Events seemed to be mostly noted during supine sleep.  No slow-wave sleep was noted.  IMPRESSION: 1. Moderate obstructive sleep apnea with predominant hypopneas during     supine sleep causing sleep fragmentation and oxygen desaturation. 2. There is no evidence of cardiac arrhythmias, limb movements, or     behavioral disturbance during sleep.  RECOMMENDATION: 1. The treatment options for this degree of sleep-disordered breathing     include weight loss, oral appliance or CPAP therapy alternatively     positional therapy can also be tried. 2. She should be cautioned against driving when sleepy.  She will be     asked to avoid medications sedative side effects.  Oretha Milch, MD    RVA/MEDQ  D:  01/25/2013 08:46:47  T:  01/26/2013 00:11:50  Job:  [id number]

## 2013-01-27 ENCOUNTER — Telehealth: Payer: Self-pay | Admitting: Critical Care Medicine

## 2013-01-27 DIAGNOSIS — G4733 Obstructive sleep apnea (adult) (pediatric): Secondary | ICD-10-CM | POA: Insufficient documentation

## 2013-01-27 NOTE — Telephone Encounter (Signed)
Tried to call, no answer  Let pt know sleep study positive for sleep apnea  See order for cpap  Also pt needs formal sleep consult with dr Vassie Loll

## 2013-01-28 ENCOUNTER — Telehealth: Payer: Self-pay | Admitting: Critical Care Medicine

## 2013-01-28 NOTE — Telephone Encounter (Signed)
Pt has appt to see RA on 8/19 in [place].   Mindy has spoken with pt regarding results per phone msg from 02/27/13.

## 2013-01-28 NOTE — Telephone Encounter (Signed)
lmomtcb on pt's home and cell #s 

## 2013-01-28 NOTE — Telephone Encounter (Signed)
lmtcb on pt's home and cell # to set up appt with RA.

## 2013-01-28 NOTE — Telephone Encounter (Signed)
Lindsey Frisk, MD at 01/27/2013  1:46 PM    Status: Signed                   Tried to call, no answer Let pt know sleep study positive for sleep apnea See order for cpap Also pt needs formal sleep consult with dr Vassie Loll   I spoke with patient about results and he verbalized understanding and had no questions. Appt scheduled in [place] to see RA.

## 2013-02-15 ENCOUNTER — Institutional Professional Consult (permissible substitution): Payer: PRIVATE HEALTH INSURANCE | Admitting: Pulmonary Disease

## 2013-03-08 ENCOUNTER — Ambulatory Visit (INDEPENDENT_AMBULATORY_CARE_PROVIDER_SITE_OTHER): Payer: PRIVATE HEALTH INSURANCE | Admitting: Pulmonary Disease

## 2013-03-08 ENCOUNTER — Encounter: Payer: Self-pay | Admitting: Pulmonary Disease

## 2013-03-08 VITALS — BP 100/60 | HR 82 | Temp 98.6°F | Ht 62.5 in | Wt 190.5 lb

## 2013-03-08 DIAGNOSIS — G4733 Obstructive sleep apnea (adult) (pediatric): Secondary | ICD-10-CM

## 2013-03-08 NOTE — Patient Instructions (Signed)
You have moderate obstructive sleep apnea We will check CPAP download report & adjust settings Weight loss important

## 2013-03-08 NOTE — Progress Notes (Signed)
Subjective:    Patient ID: Lindsey Copeland, female    DOB: 14-Nov-1960, 52 y.o.   MRN: [id number]  HPI  52 year old remote smoker with sarcoidosis presents for evaluation of obstructive sleep apnea. She presents with snoring and increased somnolence. She also reports sleep onset insomnia and difficulty with sleep maintenance for the past 6 months. Epworth sleepiness score is 7/24. 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 Bedtime is between 11 and 11:30 PM, sleep latency was prolonged but now with CPAP is less than 5 minutes. She sleeps on her right side with 2 pillows, has 1-2 nocturnal awakenings no without any post void sleep latency and is out of bed by 6 AM feeling tired with an occasional headache and dryness of mouth. She is lost 10 pounds over the last 2 years. There is no history suggestive of cataplexy, sleep paralysis or parasomnias Should asthma as a child, outgrew it and then symptoms returned after a chest cold in winter of 2013. She has Graves' disease status post radioactive iodine. Overnight polysomnogram with her weight of 190 pounds and BMI of 34 showed predominant hypoxia is during supine sleep and with AHI of 16 events per hour. Nonsupine AHI was only 3 events per hour. She was started on  auto CPAP with a medium fullface mask Pt already has CPAP machine. She reports she tries to wear everynight but sometimes she falls asleep and forgot to put it on. Pt has had the machine x 4 weeks and still trying to get use to it  Past Medical History  Diagnosis Date  . Carpal tunnel syndrome   . Hyperlipidemia   . Graves disease   . Hyperthyroidism   . Vitamin D deficiency   . Type 2 diabetes mellitus   . Asthma     Past Surgical History  Procedure Laterality Date  . Ulnar nerve repair    . Right knee arthroscopy    . Radioactive iodine treatment    . Tubal ligation    . Video bronchoscopy  07/30/2012    Procedure: VIDEO BRONCHOSCOPY WITH FLUORO;  Surgeon: Storm Frisk, MD;  Location: [facility];  Service: Cardiopulmonary;  Laterality: N/A;    Allergies  Allergen Reactions  . Sulfa Antibiotics     hives    History   Social History  . Marital Status: Married    Spouse Name: N/A    Number of Children: 3  . 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 Smoking status: Former Smoker -- 0.10 packs/day for 10 years    Types: Cigarettes    Quit date: 06/30/2002  . Smokeless tobacco: Not on file  . Alcohol Use: Yes     Comment: occasionally  . Drug Use: No  . Sexual Activity: Not on file   Other Topics Concern  . Not on file   Social History Narrative  . No narrative on file    Family History  Problem Relation Age of Onset  . Asthma Brother   . Diabetes Mother   . Heart failure Mother   . Glaucoma Mother   . Diabetes Sister      Review of Systems  Constitutional: Negative for fever and unexpected weight change.  HENT: Positive for ear pain, congestion, sneezing and trouble swallowing. Negative for nosebleeds, sore throat, rhinorrhea, dental problem, postnasal drip and sinus pressure.   Eyes: Negative for redness and itching.  Respiratory: Positive for cough and shortness of breath. Negative for chest tightness and wheezing.   Cardiovascular: Negative for palpitations and leg swelling.  Gastrointestinal: Negative for nausea and vomiting.  Genitourinary: Negative for dysuria.  Musculoskeletal: Negative for joint swelling.  Skin: Negative for rash.  Neurological: Positive for headaches.  Hematological: Does not bruise/bleed easily.  Psychiatric/Behavioral: Negative for dysphoric mood. The patient is not nervous/anxious.        Objective:   Physical Exam  Gen. Pleasant, obese, in no distress, normal affect ENT - no lesions, no post nasal drip, class 2-3 airway Neck: No JVD, no thyromegaly, no carotid bruits Lungs: no use of accessory muscles, no dullness to  percussion, decreased without rales or rhonchi  Cardiovascular: Rhythm regular, heart sounds  normal, no murmurs or gallops, no peripheral edema Abdomen: soft and non-tender, no hepatosplenomegaly, BS normal. Musculoskeletal: No deformities, no cyanosis or clubbing Neuro:  alert, non focal, no tremors       Assessment & Plan:

## 2013-03-09 NOTE — Assessment & Plan Note (Signed)
Obtian autoCPAP download & change settings as needed Positional therapy can be tried if she does not tolerate CPAP  The pathophysiology of obstructive sleep apnea , it's cardiovascular consequences & modes of treatment including CPAP were discused with the patient in detail & they evidenced understanding. Weight loss encouraged, compliance with goal of at least 4-6 hrs every night is the expectation. Advised against medications with sedative side effects Cautioned against driving when sleepy - understanding that sleepiness will vary on a day to day basis

## 2013-04-19 ENCOUNTER — Encounter: Payer: Self-pay | Admitting: Critical Care Medicine

## 2013-04-19 ENCOUNTER — Other Ambulatory Visit: Payer: Self-pay | Admitting: Pulmonary Disease

## 2013-04-19 ENCOUNTER — Ambulatory Visit (INDEPENDENT_AMBULATORY_CARE_PROVIDER_SITE_OTHER): Payer: PRIVATE HEALTH INSURANCE | Admitting: Critical Care Medicine

## 2013-04-19 VITALS — BP 108/72 | HR 64 | Temp 98.1°F | Ht 62.5 in | Wt 193.0 lb

## 2013-04-19 DIAGNOSIS — J99 Respiratory disorders in diseases classified elsewhere: Secondary | ICD-10-CM

## 2013-04-19 DIAGNOSIS — G4733 Obstructive sleep apnea (adult) (pediatric): Secondary | ICD-10-CM

## 2013-04-19 DIAGNOSIS — D869 Sarcoidosis, unspecified: Secondary | ICD-10-CM

## 2013-04-19 DIAGNOSIS — D862 Sarcoidosis of lung with sarcoidosis of lymph nodes: Secondary | ICD-10-CM

## 2013-04-19 NOTE — Assessment & Plan Note (Signed)
Improved airway function with LABA/ICS  Airway inflammation is due to sarcoidosis Plan Cont symbicort two puff bid

## 2013-04-19 NOTE — Patient Instructions (Addendum)
No change in medications. I have asked Dr Vassie Loll to see about changing the nasal mask for you Return in  4 months

## 2013-04-19 NOTE — Progress Notes (Signed)
  Subjective:    Patient ID: Lindsey Copeland, female    DOB: [birth year], 52 y.o.   MRN: [id number]  HPI  04/19/2013 Chief Complaint  Patient presents with  . Follow-up    Pt states chest tightness, coughing, reflux is better since LV.  Pt c/o sinus congestion and postnasal drainage.  Now on cpap and has helped.  OSA was dx.  Pt with sarcoidosis, and GERD. Pt would like the nasal pillows for OSA.  Review of Systems  Constitutional: Negative for unexpected weight change.  HENT: Positive for congestion and trouble swallowing. Negative for dental problem, nosebleeds, sinus pressure and sneezing.   Eyes: Negative for redness and itching.  Respiratory: Positive for chest tightness.   Cardiovascular: Negative for palpitations.  Gastrointestinal: Negative for nausea.  Genitourinary: Negative for dysuria.  Musculoskeletal: Positive for arthralgias. Negative for joint swelling.  Hematological: Does not bruise/bleed easily.  Psychiatric/Behavioral: Negative for dysphoric mood. The patient is nervous/anxious.        Objective:   Physical Exam  Filed Vitals:   04/19/13 1620  BP: 108/72  Pulse: 64  Temp: 98.1 F (36.7 C)  TempSrc: Oral  Height: 5' 2.5" (1.588 m)  Weight: 193 lb (87.544 kg)  SpO2: 98%    Gen: Pleasant, well-nourished, in no distress,  normal affect  ENT: No lesions,  mouth clear,  oropharynx clear, no postnasal drip  Neck: No JVD, no TMG, no carotid bruits  Lungs: No use of accessory muscles, no dullness to percussion, decreased wheezing  Cardiovascular: RRR, heart sounds normal, no murmur or gallops, no peripheral edema  Abdomen: soft and NT, no HSM,  BS normal  Musculoskeletal: No deformities, no cyanosis or clubbing  Neuro: alert, non focal  Skin: Warm, no lesions or rashes          Assessment & Plan:   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 albuterol (PROVENTIL HFA;VENTOLIN HFA) 108 (90 BASE) MCG/ACT inhaler Inhale 2 puffs into the lungs every 6 (six) hours as needed.      Marland Kitchen atorvastatin (LIPITOR) 80 MG tablet Take 80 mg by mouth daily.      . budesonide-formoterol (SYMBICORT) 160-4.5 MCG/ACT inhaler Inhale 2 puffs into the lungs 2 (two) times daily.  1 Inhaler  12  . fluticasone (FLONASE) 50 MCG/ACT nasal spray Place 2 sprays into the nose daily as needed.      Marland Kitchen ibuprofen (ADVIL,MOTRIN) 200 MG tablet Take 200 mg by mouth every 6 (six) hours as needed.      . metFORMIN (GLUCOPHAGE) 500 MG tablet Take 500 mg by mouth 2 (two) times daily.      . Multiple Vitamin (MULTIVITAMIN) tablet Take 1 tablet by mouth daily.      Marland Kitchen omeprazole (PRILOSEC) 20 MG capsule Take 20 mg by mouth daily as needed.       No facility-administered encounter medications on file as of 04/19/2013.

## 2013-05-10 ENCOUNTER — Ambulatory Visit: Payer: PRIVATE HEALTH INSURANCE | Admitting: Pulmonary Disease

## 2013-05-30 ENCOUNTER — Encounter: Payer: Self-pay | Admitting: Pulmonary Disease

## 2013-05-30 ENCOUNTER — Ambulatory Visit (INDEPENDENT_AMBULATORY_CARE_PROVIDER_SITE_OTHER): Payer: PRIVATE HEALTH INSURANCE | Admitting: Pulmonary Disease

## 2013-05-30 VITALS — BP 110/62 | HR 80 | Temp 98.2°F | Ht 62.5 in | Wt 195.0 lb

## 2013-05-30 DIAGNOSIS — G4733 Obstructive sleep apnea (adult) (pediatric): Secondary | ICD-10-CM

## 2013-05-30 NOTE — Progress Notes (Signed)
   Subjective:    Patient ID: Lindsey Copeland, female    DOB: [birth year], 52 y.o.   MRN: [id number]  HPI  52 year old remote smoker with sarcoidosis & moderate obstructive sleep apnea, esp supine position.  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  She had asthma as a child, outgrew it and then symptoms returned after a chest cold in winter of 2013. She has Graves' disease status post radioactive iodine.  Overnight polysomnogram with her weight of 190 pounds and BMI of 34 showed predominant hypopneas during supine sleep and with AHI of 16 events per hour. Nonsupine AHI was only 3 events per hour.    05/30/2013 She was started on auto CPAP with a medium fullface mask in aug 2014, had initial difficulty but has now adjusted  Download 05/28/13 - avg use 4h, avg pr 14 cm, no residuals or leak She wonders if a nasal mask would be better Bedtime is between 11 and 11:30 PM, sleep latency was prolonged but now with CPAP is less than 5 minutes. She sleeps on her right side with 2 pillows, has 1-2 nocturnal awakenings no without any post void sleep latency and is out of bed by 6 AM feeling tired with an occasional headache and dryness of mouth.   Review of Systems neg for any significant sore throat, dysphagia, itching, sneezing, nasal congestion or excess/ purulent secretions, fever, chills, sweats, unintended wt loss, pleuritic or exertional cp, hempoptysis, orthopnea pnd or change in chronic leg swelling. Also denies presyncope, palpitations, heartburn, abdominal pain, nausea, vomiting, diarrhea or change in bowel or urinary habits, dysuria,hematuria, rash, arthralgias, visual complaints, headache, numbness weakness or ataxia.     Objective:   Physical Exam  Gen. Pleasant, obese, in no distress ENT - no lesions, no post nasal drip Neck: No JVD, no thyromegaly, no carotid bruits Lungs: no use of accessory muscles, no dullness to  percussion, decreased without rales or rhonchi  Cardiovascular: Rhythm regular, heart sounds  normal, no murmurs or gallops, no peripheral edema Musculoskeletal: No deformities, no cyanosis or clubbing , no tremors       Assessment & Plan:

## 2013-05-30 NOTE — Assessment & Plan Note (Signed)
We will change CPAP settings to auto 5-15 cm Ok to trial nasal mask Download in 1 month  Weight loss encouraged, compliance with goal of at least 4-6 hrs every night is the expectation. Advised against medications with sedative side effects Cautioned against driving when sleepy - understanding that sleepiness will vary on a day to day basis

## 2013-05-30 NOTE — Patient Instructions (Signed)
We will change CPAP settings Ok to trial nasal mask Download in 1 month Weight loss encouraged, compliance with goal of at least 4-6 hrs every night is the expectation.

## 2013-06-25 ENCOUNTER — Telehealth: Payer: Self-pay | Admitting: Pulmonary Disease

## 2013-06-25 NOTE — Telephone Encounter (Signed)
06/14/13  -14 d download on auto 5-15  - pr ok Should use every day avg 5h

## 2013-06-27 NOTE — Telephone Encounter (Signed)
Called, spoke with pt's husband.  Was advised pt is at work.  He will ask her to call office back.

## 2013-06-28 NOTE — Telephone Encounter (Signed)
I spoke with patient about results and she verbalized understanding and had no questions 

## 2013-08-02 ENCOUNTER — Telehealth: Payer: Self-pay | Admitting: *Deleted

## 2013-08-02 NOTE — Telephone Encounter (Signed)
Auto 5-15. Very effective when used! Increase usage 6/night No other changes

## 2013-08-02 NOTE — Telephone Encounter (Signed)
lmomtcb x1 for pt 

## 2013-08-03 NOTE — Telephone Encounter (Signed)
Returning mindy's call can be reached at [phone].Lindsey EvertsJuanita S Copeland

## 2013-08-04 NOTE — Telephone Encounter (Signed)
lmomtcb x2 for pt on both #'s listed for pt

## 2013-08-08 NOTE — Telephone Encounter (Signed)
Pt returned call. Please call back [phone]

## 2013-08-08 NOTE — Telephone Encounter (Signed)
lmtcb x3 

## 2013-08-09 NOTE — Telephone Encounter (Signed)
Pt is aware of download results. 

## 2013-08-14 ENCOUNTER — Other Ambulatory Visit: Payer: Self-pay | Admitting: Critical Care Medicine

## 2013-10-05 ENCOUNTER — Other Ambulatory Visit: Payer: Self-pay | Admitting: Critical Care Medicine

## 2013-10-06 ENCOUNTER — Other Ambulatory Visit: Payer: Self-pay | Admitting: Critical Care Medicine

## 2013-12-22 ENCOUNTER — Other Ambulatory Visit: Payer: Self-pay | Admitting: Critical Care Medicine

## 2014-02-01 ENCOUNTER — Ambulatory Visit: Payer: PRIVATE HEALTH INSURANCE | Admitting: Critical Care Medicine

## 2014-02-15 ENCOUNTER — Other Ambulatory Visit: Payer: Self-pay

## 2014-02-15 DIAGNOSIS — Z1231 Encounter for screening mammogram for malignant neoplasm of breast: Secondary | ICD-10-CM

## 2014-02-27 ENCOUNTER — Ambulatory Visit
Admission: RE | Admit: 2014-02-27 | Discharge: 2014-02-27 | Disposition: A | Discharge status: Home / Self Care | Payer: PRIVATE HEALTH INSURANCE | Source: Ambulatory Visit

## 2014-02-27 DIAGNOSIS — Z1231 Encounter for screening mammogram for malignant neoplasm of breast: Secondary | ICD-10-CM

## 2014-04-25 ENCOUNTER — Other Ambulatory Visit: Payer: Self-pay | Admitting: Critical Care Medicine

## 2014-04-27 NOTE — Telephone Encounter (Signed)
Received symbicort rx request.  Last OV with PW: 04/19/13; asked to f/u in 4 months. No pending appt.  Called, spoke with pt.  She would like to schedule yearly f/u with PW.  [facility]  Pt confirmed appt, is aware symbicort rx sent to pharm, and voiced no further questions or concerns at this time.

## 2014-05-08 ENCOUNTER — Encounter (INDEPENDENT_AMBULATORY_CARE_PROVIDER_SITE_OTHER): Payer: Self-pay

## 2014-05-08 ENCOUNTER — Ambulatory Visit (INDEPENDENT_AMBULATORY_CARE_PROVIDER_SITE_OTHER)
Admission: RE | Admit: 2014-05-08 | Discharge: 2014-05-08 | Disposition: A | Discharge status: Home / Self Care | Payer: PRIVATE HEALTH INSURANCE | Source: Ambulatory Visit | Attending: Critical Care Medicine | Admitting: Critical Care Medicine

## 2014-05-08 ENCOUNTER — Encounter: Payer: Self-pay | Admitting: Critical Care Medicine

## 2014-05-08 ENCOUNTER — Ambulatory Visit (INDEPENDENT_AMBULATORY_CARE_PROVIDER_SITE_OTHER): Payer: PRIVATE HEALTH INSURANCE | Admitting: Critical Care Medicine

## 2014-05-08 VITALS — BP 100/60 | HR 72 | Ht 62.5 in | Wt 195.8 lb

## 2014-05-08 DIAGNOSIS — D869 Sarcoidosis, unspecified: Secondary | ICD-10-CM

## 2014-05-08 DIAGNOSIS — D862 Sarcoidosis of lung with sarcoidosis of lymph nodes: Secondary | ICD-10-CM

## 2014-05-08 MED ORDER — ALBUTEROL SULFATE HFA 108 (90 BASE) MCG/ACT IN AERS: 2.0000 | INHALATION_SPRAY | Freq: Four times a day (QID) | RESPIRATORY_TRACT | Status: DC | PRN

## 2014-05-08 MED ORDER — BUDESONIDE-FORMOTEROL FUMARATE 160-4.5 MCG/ACT IN AERO: INHALATION_SPRAY | RESPIRATORY_TRACT | Status: DC

## 2014-05-08 MED ORDER — FLUTICASONE PROPIONATE 50 MCG/ACT NA SUSP: 2.0000 | Freq: Every day | NASAL | Status: DC | PRN

## 2014-05-08 NOTE — Progress Notes (Signed)
Quick Note:  Notify the patient that the Xray is stable and no active disease, also her sarcoidosis has improved and the inflammation in her lymphnodes has resolved  No change in medications are recommended. Continue current meds as prescribed at last office visit ______

## 2014-05-08 NOTE — Assessment & Plan Note (Addendum)
Stable sarcoidosis Cxr 05/08/2014 revealed stable lungs, with improvement in inflammation from before and reduction in size of LNs Plan Cont inhaled meds  Repeat pfts

## 2014-05-08 NOTE — Progress Notes (Signed)
Subjective:    Patient ID: Lindsey Copeland, female    DOB: 08/02/1960, 53 y.o.   MRN: [id number]  HPI  05/08/2014 Chief Complaint  Patient presents with  . Yearly Follow up    increased SOB when talking/walking x 2 months.  Nonprod cough, PND, and sinus pressure.  No chest tightness/pain or f/c/s.  Pt still with a dry cough.  Seems better vs before.  Hx of Sarcoidosis and GERD.  Uses antihistamine and helps pndrip. Pt on cpap and tol well Pt denies any significant sore throat, nasal congestion or excess secretions, fever, chills, sweats, unintended weight loss, pleurtic or exertional chest pain, orthopnea PND, or leg swelling Pt denies any increase in rescue therapy over baseline, denies waking up needing it or having any early am or nocturnal exacerbations of coughing/wheezing/or dyspnea. Pt also denies any obvious fluctuation in symptoms with  weather or environmental change or other alleviating or aggravating factors    Review of Systems  Constitutional: Negative for unexpected weight change.  HENT: Positive for trouble swallowing. Negative for congestion, dental problem, nosebleeds, sinus pressure and sneezing.   Eyes: Negative for redness and itching.  Respiratory: Negative for chest tightness.   Cardiovascular: Negative for palpitations.  Gastrointestinal: Negative for nausea.  Genitourinary: Negative for dysuria.  Musculoskeletal: Positive for arthralgias. Negative for joint swelling.  Hematological: Does not bruise/bleed easily.  Psychiatric/Behavioral: Negative for dysphoric mood. The patient is nervous/anxious.        Objective:   Physical Exam  Filed Vitals:   05/08/14 1033  BP: 100/60  Pulse: 72  Height: 5' 2.5" (1.588 m)  Weight: 195 lb 12.8 oz (88.814 kg)  SpO2: 99%    Gen: Pleasant, well-nourished, in no distress,  normal affect  ENT: No lesions,  mouth clear,  oropharynx clear, no postnasal drip  Neck: No JVD, no TMG, no carotid bruits  Lungs: No use  of accessory muscles, no dullness to percussion, no  wheezing  Cardiovascular: RRR, heart sounds normal, no murmur or gallops, no peripheral edema  Abdomen: soft and NT, no HSM,  BS normal  Musculoskeletal: No deformities, no cyanosis or clubbing  Neuro: alert, non focal  Skin: Warm, no lesions or rashes   Dg Chest 2 View  05/08/2014   CLINICAL DATA:  Followup sarcoidosis  EXAM: CHEST  2 VIEW  COMPARISON:  07/15/2012  FINDINGS: Cardiomediastinal silhouette is stable. No acute infiltrate or pulmonary edema. Improvement in previous bilateral hilar adenopathy. Stable mild degenerative changes mid thoracic spine.  IMPRESSION: No active disease. Improvement in previous bilateral hilar adenopathy.   Electronically Signed   By: Lindsey MeadLiviu  Copeland M.D.   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 albuterol (PROVENTIL HFA;VENTOLIN HFA) 108 (90 BASE) MCG/ACT inhaler Inhale 2 puffs into the lungs every 6 (six) hours as needed.  Marland Kitchen. aspirin 81 MG tablet Take 81 mg by mouth daily.  Marland Kitchen. atorvastatin (LIPITOR) 80 MG tablet Take 80 mg by mouth daily.  . budesonide-formoterol (SYMBICORT) 160-4.5 MCG/ACT inhaler INHALE TWO PUFFS INTO LUNGS TWICE DAILY  . Cholecalciferol (VITAMIN D-3) 1000  UNITS CAPS Take by mouth daily.   . fluticasone (FLONASE) 50 MCG/ACT nasal spray Place 2 sprays into both nostrils daily as needed.  Marland Kitchen. ibuprofen (ADVIL,MOTRIN) 200 MG tablet Take 200 mg by mouth every 6 (six) hours as needed.  . metFORMIN (GLUCOPHAGE) 500 MG tablet Take 500 mg by mouth 2 (two) times daily.  . Multiple Vitamin (MULTIVITAMIN)  tablet Take 1 tablet by mouth daily.  Marland Kitchen. omeprazole (PRILOSEC) 20 MG capsule Take 20 mg by mouth daily as needed.  . [DISCONTINUED] albuterol (PROVENTIL HFA;VENTOLIN HFA) 108 (90 BASE) MCG/ACT inhaler Inhale 2 puffs into the lungs every 6 (six) hours as needed.  . [DISCONTINUED] fluticasone (FLONASE) 50 MCG/ACT nasal spray Place 2 sprays into the nose daily as needed.  . [DISCONTINUED] SYMBICORT 160-4.5 MCG/ACT inhaler INHALE TWO PUFFS INTO LUNGS TWICE DAILY

## 2014-05-08 NOTE — Patient Instructions (Signed)
Refills sent to [facility] A breathing test will be scheduled Chest xray today Return 1 year

## 2014-05-10 NOTE — Progress Notes (Signed)
Quick Note:  Called, spoke with pt - Discussed cxr results and recs with her per PW. She verbalized understanding and voiced no further questions or concerns at this time. ______

## 2014-05-31 ENCOUNTER — Encounter (HOSPITAL_BASED_OUTPATIENT_CLINIC_OR_DEPARTMENT_OTHER): Payer: Self-pay

## 2014-05-31 ENCOUNTER — Emergency Department (HOSPITAL_BASED_OUTPATIENT_CLINIC_OR_DEPARTMENT_OTHER): Payer: PRIVATE HEALTH INSURANCE

## 2014-05-31 ENCOUNTER — Emergency Department (HOSPITAL_BASED_OUTPATIENT_CLINIC_OR_DEPARTMENT_OTHER)
Admission: EM | Admit: 2014-05-31 | Discharge: 2014-05-31 | Disposition: A | Discharge status: Home / Self Care | Payer: PRIVATE HEALTH INSURANCE | Attending: Emergency Medicine | Admitting: Emergency Medicine

## 2014-05-31 DIAGNOSIS — E119 Type 2 diabetes mellitus without complications: Secondary | ICD-10-CM | POA: Diagnosis not present

## 2014-05-31 DIAGNOSIS — Z79899 Other long term (current) drug therapy: Secondary | ICD-10-CM | POA: Diagnosis not present

## 2014-05-31 DIAGNOSIS — E559 Vitamin D deficiency, unspecified: Secondary | ICD-10-CM | POA: Diagnosis not present

## 2014-05-31 DIAGNOSIS — S6991XA Unspecified injury of right wrist, hand and finger(s), initial encounter: Secondary | ICD-10-CM | POA: Insufficient documentation

## 2014-05-31 DIAGNOSIS — M79643 Pain in unspecified hand: Secondary | ICD-10-CM

## 2014-05-31 DIAGNOSIS — J45909 Unspecified asthma, uncomplicated: Secondary | ICD-10-CM | POA: Diagnosis not present

## 2014-05-31 DIAGNOSIS — Z87891 Personal history of nicotine dependence: Secondary | ICD-10-CM | POA: Insufficient documentation

## 2014-05-31 DIAGNOSIS — Z7951 Long term (current) use of inhaled steroids: Secondary | ICD-10-CM | POA: Insufficient documentation

## 2014-05-31 DIAGNOSIS — E0500 Thyrotoxicosis with diffuse goiter without thyrotoxic crisis or storm: Secondary | ICD-10-CM | POA: Insufficient documentation

## 2014-05-31 DIAGNOSIS — Y998 Other external cause status: Secondary | ICD-10-CM | POA: Diagnosis not present

## 2014-05-31 DIAGNOSIS — Z7982 Long term (current) use of aspirin: Secondary | ICD-10-CM | POA: Insufficient documentation

## 2014-05-31 DIAGNOSIS — M542 Cervicalgia: Secondary | ICD-10-CM

## 2014-05-31 DIAGNOSIS — E785 Hyperlipidemia, unspecified: Secondary | ICD-10-CM | POA: Insufficient documentation

## 2014-05-31 DIAGNOSIS — Z8669 Personal history of other diseases of the nervous system and sense organs: Secondary | ICD-10-CM | POA: Insufficient documentation

## 2014-05-31 DIAGNOSIS — S161XXA Strain of muscle, fascia and tendon at neck level, initial encounter: Secondary | ICD-10-CM | POA: Diagnosis not present

## 2014-05-31 DIAGNOSIS — Y9389 Activity, other specified: Secondary | ICD-10-CM | POA: Insufficient documentation

## 2014-05-31 DIAGNOSIS — Z792 Long term (current) use of antibiotics: Secondary | ICD-10-CM | POA: Insufficient documentation

## 2014-05-31 DIAGNOSIS — S3992XA Unspecified injury of lower back, initial encounter: Secondary | ICD-10-CM | POA: Diagnosis not present

## 2014-05-31 DIAGNOSIS — Y92410 Unspecified street and highway as the place of occurrence of the external cause: Secondary | ICD-10-CM | POA: Diagnosis not present

## 2014-05-31 DIAGNOSIS — M545 Low back pain, unspecified: Secondary | ICD-10-CM

## 2014-05-31 DIAGNOSIS — Z9889 Other specified postprocedural states: Secondary | ICD-10-CM | POA: Insufficient documentation

## 2014-05-31 DIAGNOSIS — S199XXA Unspecified injury of neck, initial encounter: Secondary | ICD-10-CM | POA: Diagnosis present

## 2014-05-31 MED ORDER — KETOROLAC TROMETHAMINE 60 MG/2ML IM SOLN
60.0000 mg | Freq: Once | INTRAMUSCULAR | Status: AC
  Administered 2014-05-31: 60 mg via INTRAMUSCULAR
  Filled 2014-05-31: qty 2

## 2014-05-31 MED ORDER — METHOCARBAMOL 500 MG PO TABS: 500.0000 mg | ORAL_TABLET | Freq: Two times a day (BID) | ORAL | Status: DC

## 2014-05-31 MED ORDER — OXYCODONE-ACETAMINOPHEN 5-325 MG PO TABS: 1.0000 | ORAL_TABLET | ORAL | Status: DC | PRN

## 2014-05-31 NOTE — Discharge Instructions (Signed)
1. Medications: robaxin, percocet, tylenol and ibuprofen, usual home medications 2. Treatment: rest, drink plenty of fluids, gentle stretching as discussed, alternate ice and heat 3. Follow Up: Please followup with your primary doctor in 3-5 days for discussion of your diagnoses and further evaluation after today's visit; if you do not have a primary care doctor use the resource guide provided to find one;  Return to the ER for worsening back pain, difficulty walking, loss of bowel or bladder control or other concerning symptoms  Muscle Cramps and Spasms Muscle cramps and spasms occur when a muscle or muscles tighten and you have no control over this tightening (involuntary muscle contraction). They are a common problem and can develop in any muscle. The most common place is in the calf muscles of the leg. Both muscle cramps and muscle spasms are involuntary muscle contractions, but they also have differences:   Muscle cramps are sporadic and painful. They may last a few seconds to a quarter of an hour. Muscle cramps are often more forceful and last longer than muscle spasms.  Muscle spasms may or may not be painful. They may also last just a few seconds or much longer. CAUSES  It is uncommon for cramps or spasms to be due to a serious underlying problem. In many cases, the cause of cramps or spasms is unknown. Some common causes are:   Overexertion.   Overuse from repetitive motions (doing the same thing over and over).   Remaining in a certain position for a long period of time.   Improper preparation, form, or technique while performing a sport or activity.   Dehydration.   Injury.   Side effects of some medicines.   Abnormally low levels of the salts and ions in your blood (electrolytes), especially potassium and calcium. This could happen if you are taking water pills (diuretics) or you are pregnant.  Some underlying medical problems can make it more likely to develop cramps  or spasms. These include, but are not limited to:   Diabetes.   Parkinson disease.   Hormone disorders, such as thyroid problems.   Alcohol abuse.   Diseases specific to muscles, joints, and bones.   Blood vessel disease where not enough blood is getting to the muscles.  HOME CARE INSTRUCTIONS   Stay well hydrated. Drink enough water and fluids to keep your urine clear or pale yellow.  It may be helpful to massage, stretch, and relax the affected muscle.  For tight or tense muscles, use a warm towel, heating pad, or hot shower water directed to the affected area.  If you are sore or have pain after a cramp or spasm, applying ice to the affected area may relieve discomfort.  Put ice in a plastic bag.  Place a towel between your skin and the bag.  Leave the ice on for 15-20 minutes, 03-04 times a day.  Medicines used to treat a known cause of cramps or spasms may help reduce their frequency or severity. Only take over-the-counter or prescription medicines as directed by your caregiver. SEEK MEDICAL CARE IF:  Your cramps or spasms get more severe, more frequent, or do not improve over time.  MAKE SURE YOU:   Understand these instructions.  Will watch your condition.  Will get help right away if you are not doing well or get worse. Document Released: 12/06/2001 Document Revised: 10/11/2012 Document Reviewed: 06/02/2012 [facility] Patient Information 2015 [place]. This information is not intended to replace advice given to you by  your health care provider. Make sure you discuss any questions you have with your health care provider.
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
## 2014-05-31 NOTE — ED Notes (Signed)
Pa  at bedside. 

## 2014-06-07 ENCOUNTER — Other Ambulatory Visit: Payer: Self-pay | Admitting: *Deleted

## 2014-06-07 MED ORDER — BUDESONIDE-FORMOTEROL FUMARATE 160-4.5 MCG/ACT IN AERO: INHALATION_SPRAY | RESPIRATORY_TRACT | Status: DC

## 2014-06-08 ENCOUNTER — Ambulatory Visit
Admission: RE | Admit: 2014-06-08 | Discharge: 2014-06-08 | Disposition: A | Discharge status: Home / Self Care | Payer: PRIVATE HEALTH INSURANCE | Source: Ambulatory Visit | Attending: Physician Assistant | Admitting: Physician Assistant

## 2014-06-08 ENCOUNTER — Other Ambulatory Visit: Payer: Self-pay | Admitting: Physician Assistant

## 2014-06-08 DIAGNOSIS — M542 Cervicalgia: Secondary | ICD-10-CM

## 2014-10-27 ENCOUNTER — Ambulatory Visit (INDEPENDENT_AMBULATORY_CARE_PROVIDER_SITE_OTHER): Payer: PRIVATE HEALTH INSURANCE | Admitting: Pulmonary Disease

## 2014-10-27 ENCOUNTER — Encounter: Payer: Self-pay | Admitting: Pulmonary Disease

## 2014-10-27 ENCOUNTER — Encounter (INDEPENDENT_AMBULATORY_CARE_PROVIDER_SITE_OTHER): Payer: Self-pay

## 2014-10-27 VITALS — BP 120/80 | HR 92 | Temp 98.2°F | Ht 62.5 in | Wt 196.0 lb

## 2014-10-27 DIAGNOSIS — G4733 Obstructive sleep apnea (adult) (pediatric): Secondary | ICD-10-CM | POA: Diagnosis not present

## 2014-10-27 DIAGNOSIS — D862 Sarcoidosis of lung with sarcoidosis of lymph nodes: Secondary | ICD-10-CM | POA: Diagnosis not present

## 2014-10-27 MED ORDER — PREDNISONE 10 MG PO TABS
ORAL_TABLET | ORAL | Status: DC
Start: 2014-10-27 — End: 2015-04-24

## 2014-10-27 NOTE — Assessment & Plan Note (Signed)
We'll obtain CPAP download and ensure compliance Weight loss encouraged, compliance with goal of at least 4-6 hrs every night is the expectation. Advised against medications with sedative side effects Cautioned against driving when sleepy - understanding that sleepiness will vary on a day to day basis

## 2014-10-27 NOTE — Progress Notes (Signed)
   Subjective:    Patient ID: Lindsey EchevariaJanie Copeland, female    DOB: 06/03/1961, 54 y.o.   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  She had asthma as a child, outgrew it and then symptoms returned after a chest cold in winter of 2013. She has Graves' disease status post radioactive iodine.    Significant tests/ events  01/2013 PSG -wt 190 pounds, BMI 34 showed predominant hypopneas during supine sleep and with AHI of 16 events per hour. Nonsupine AHI was only 3 events per hour.  01/2013 >> started on auto CPAP with a medium fullface mask in, had initial difficulty but has now adjusted  Download 05/28/13 - avg use 4h, avg pr 14 cm, no residuals or leak 05/2013 download on auto 5-15 -avg pr 15   PFTs 07/2012 nml lung volumes, no obs, DLCO 71% CT chest 05/2013 Mediastinal/bilateral hilar lymphadenopathy, Peribronchovascular nodularity throughout all lobes, numerous  lesions in the spleen. CXR 04/2014 decreased LNs  10/27/2014  Chief Complaint  Patient presents with  . Acute Visit    increased SOB, headaches, feels like throat is closing up.  At her job, they are painting and putting down flooring.  Last week of March, she was exposed to flu.  She started wheezing a lot since then.  Was diagnosed with Asthmatic Bronchitis at that time. She wants to get a nebulizer machine. feeling shaky and nervous; clearing throat a lot, but not coughing, earaches    Around easter, daughter had flu - developed wheezing - given neb in PCP office and z- pak. She improved for a few weeks but now reports worsening dyspnea. 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 There is relaying the floor with tiles and using glue. The odor seem to come in through the ventilation. She had a childhood history of asthma-and wonders if this may be flaring up again. She is  compliant with Symbicort, uses albuterol once a day She requests an incentive spirometer  I have reviewed all relevant imaging, labs & test data  Review of Systems neg for any significant sore throat, dysphagia, itching, sneezing, nasal congestion or excess/ purulent secretions, fever, chills, sweats, unintended wt loss, pleuritic or exertional cp, hempoptysis, orthopnea pnd or change in chronic leg swelling. Also denies presyncope, palpitations, heartburn, abdominal pain, nausea, vomiting, diarrhea or change in bowel or urinary habits, dysuria,hematuria, rash, arthralgias, visual complaints, headache, numbness weakness or ataxia.      Objective:   Physical Exam  Gen. Pleasant, obese, in no distress, normal affect ENT - no lesions, no post nasal drip, class 2-3 airway Neck: No JVD, no thyromegaly, no carotid bruits Lungs: no use of accessory muscles, no dullness to percussion, decreased without rales or rhonchi  Cardiovascular: Rhythm regular, heart sounds  normal, no murmurs or gallops, no peripheral edema Abdomen: soft and non-tender, no hepatosplenomegaly, BS normal. Musculoskeletal: No deformities, no cyanosis or clubbing Neuro:  alert, non focal, no tremors       Assessment & Plan:

## 2014-10-27 NOTE — Assessment & Plan Note (Addendum)
Appeared improved on chest x-ray in 04/2014 We'll obtain PFTs and compared to 2014 to see if restriction is any worse Stay on symbicort twice daily Albuterol MDI 2 puffs as needed for wheezing or chest tightness Prednisone 10 mg tabs  Take 2 tabs daily with food x 5ds, then 1 tab daily with food x 5ds then STOP Steroid should help if this is sarcoid flare as well as if this is asthma exacerbation I doubt that she needs time off work for this reason-we should be able to control her symptoms with maintenance therapy

## 2014-10-27 NOTE — Patient Instructions (Signed)
Stay on symbicort twice daily Albuterol MDI 2 puffs as needed for wheezing or chest tightness Prednisone 10 mg tabs  Take 2 tabs daily with food x 5ds, then 1 tab daily with food x 5ds then STOP Schedule breathing test

## 2014-10-31 ENCOUNTER — Ambulatory Visit (INDEPENDENT_AMBULATORY_CARE_PROVIDER_SITE_OTHER): Payer: PRIVATE HEALTH INSURANCE | Admitting: Pulmonary Disease

## 2014-10-31 DIAGNOSIS — D862 Sarcoidosis of lung with sarcoidosis of lymph nodes: Secondary | ICD-10-CM

## 2014-10-31 LAB — PULMONARY FUNCTION TEST
DL/VA % PRED: 106 %
DL/VA: 4.75 ml/min/mmHg/L
DLCO UNC: 17.49 ml/min/mmHg
DLCO unc % pred: 83 %
FEF 25-75 POST: 2.65 L/s
FEF 25-75 Pre: 2.34 L/sec
FEF2575-%Change-Post: 12 %
FEF2575-%PRED-POST: 127 %
FEF2575-%PRED-PRE: 113 %
FEV1-%Change-Post: 3 %
FEV1-%PRED-POST: 112 %
FEV1-%Pred-Pre: 108 %
FEV1-Post: 2.22 L
FEV1-Pre: 2.16 L
FEV1FVC-%Change-Post: 3 %
FEV1FVC-%PRED-PRE: 103 %
FEV6-%Change-Post: 0 %
FEV6-%PRED-POST: 107 %
FEV6-%Pred-Pre: 107 %
FEV6-Post: 2.6 L
FEV6-Pre: 2.59 L
FEV6FVC-%Pred-Post: 103 %
FEV6FVC-%Pred-Pre: 103 %
FVC-%Change-Post: 0 %
FVC-%PRED-PRE: 104 %
FVC-%Pred-Post: 104 %
FVC-Post: 2.6 L
FVC-Pre: 2.61 L
PRE FEV6/FVC RATIO: 100 %
Post FEV1/FVC ratio: 85 %
Post FEV6/FVC ratio: 100 %
Pre FEV1/FVC ratio: 83 %
RV % pred: 68 %
RV: 1.22 L
TLC % PRED: 82 %
TLC: 3.87 L

## 2014-10-31 NOTE — Progress Notes (Signed)
PFT performed today. 

## 2014-11-06 ENCOUNTER — Telehealth: Payer: Self-pay | Admitting: Pulmonary Disease

## 2014-11-06 NOTE — Telephone Encounter (Signed)
lmtcb for pt.  

## 2014-11-07 NOTE — Telephone Encounter (Signed)
Okay to stop prednisone PFTs appear normal

## 2014-11-07 NOTE — Telephone Encounter (Signed)
Pt returning call.Lindsey Copeland ° °

## 2014-11-07 NOTE — Telephone Encounter (Signed)
lmtcb

## 2014-11-07 NOTE — Telephone Encounter (Signed)
Spoke with pt, states she stopped prednisone on Friday d/t chest tightness, increased sob, "sick" feeling.  Pt took prednisone for 8 days- had only 2 tablets left.   Pt is wanting to know if this is adequate treatment, or if Dr. Vassie LollAlva has further recommendations for her- she does note that she feels better. Also is requesting PFT results from last week.  RA please advise.  Thanks!

## 2014-11-07 NOTE — Telephone Encounter (Signed)
Pt cb, [phone]

## 2014-11-07 NOTE — Telephone Encounter (Signed)
[phone], pt cb

## 2014-11-07 NOTE — Telephone Encounter (Signed)
I spoke with patient about results and she verbalized understanding and had no questions 

## 2014-11-07 NOTE — Telephone Encounter (Signed)
LMTCB x 1 

## 2014-11-23 ENCOUNTER — Telehealth: Payer: Self-pay | Admitting: Pulmonary Disease

## 2014-11-23 DIAGNOSIS — G4733 Obstructive sleep apnea (adult) (pediatric): Secondary | ICD-10-CM

## 2014-11-23 NOTE — Telephone Encounter (Signed)
Pt returning call.Lindsey Copeland ° °

## 2014-11-23 NOTE — Telephone Encounter (Signed)
Obtain CPAP download to review Let he know -DME can take care of this for her- she should not need separate letter etc

## 2014-11-23 NOTE — Telephone Encounter (Signed)
Pt is aware of below. [facility]

## 2014-11-23 NOTE — Telephone Encounter (Signed)
lmomtcb x1 for pt Per triage protocol, nurse is responsible for tracking down records/results. Will forward to michelle.

## 2014-11-23 NOTE — Telephone Encounter (Signed)
Spoke with pt, requesting a copy of her last office note.  Also, states she needs a letter of compliance and necessity for her cpap.  States she received a letter from her insurance company requesting all of this info which is why she's requesting this.  Pt would like this mailed to her home, verified address on file.    RA are you ok with this letter, and what does it need to say?  Thanks!

## 2014-11-28 NOTE — Telephone Encounter (Signed)
Marcelino DusterMichelle please advise if you've received this download.  Thanks!

## 2014-11-28 NOTE — Telephone Encounter (Signed)
Have not received download, sent order for download will follow up on this until I receive download... Awaiting response from [employer].Marland Kitchen..Marland Kitchen

## 2014-11-30 NOTE — Telephone Encounter (Signed)
Done

## 2014-11-30 NOTE — Telephone Encounter (Signed)
Lindsey Copeland, I called [employer] for you and got the download sent to triage - it has been received and is in my possession.  Would you like me to fax it to [employer] for you so RA can review it this afternoon or give it to you tomorrow when you return to the [place] office?

## 2014-11-30 NOTE — Telephone Encounter (Signed)
lmtcb

## 2014-11-30 NOTE — Telephone Encounter (Signed)
Download 10/2014 on auto >> poor usage only 9/30 days CPAP is effective when used Cannot give her letter - until she is compliant - at least 4h every night Otherwise insurance will stop paying for CPAP

## 2014-11-30 NOTE — Telephone Encounter (Signed)
Please fax to [phone]  Thanks.

## 2014-11-30 NOTE — Telephone Encounter (Signed)
Pl take care of this

## 2014-12-01 NOTE — Telephone Encounter (Signed)
Patient states that she wears her CPAP every night, she doesn't understand why it says she only wore it 9 days out of 30.  She said she will contact [employer] and have them look at her machine.  She will let us know if we need to do anything.  Nothing further needed.

## 2014-12-19 ENCOUNTER — Encounter: Payer: Self-pay | Admitting: Pulmonary Disease

## 2015-01-19 ENCOUNTER — Other Ambulatory Visit: Payer: Self-pay | Admitting: Orthopedic Surgery

## 2015-01-26 ENCOUNTER — Ambulatory Visit: Payer: PRIVATE HEALTH INSURANCE | Admitting: Adult Health

## 2015-01-30 ENCOUNTER — Ambulatory Visit: Payer: PRIVATE HEALTH INSURANCE | Admitting: Adult Health

## 2015-02-16 ENCOUNTER — Encounter (HOSPITAL_BASED_OUTPATIENT_CLINIC_OR_DEPARTMENT_OTHER): Payer: Self-pay | Admitting: *Deleted

## 2015-02-19 ENCOUNTER — Other Ambulatory Visit: Payer: Self-pay

## 2015-02-19 ENCOUNTER — Encounter (HOSPITAL_BASED_OUTPATIENT_CLINIC_OR_DEPARTMENT_OTHER)
Admission: RE | Admit: 2015-02-19 | Discharge: 2015-02-19 | Disposition: A | Discharge status: Home / Self Care | Payer: PRIVATE HEALTH INSURANCE | Source: Ambulatory Visit | Attending: Orthopedic Surgery | Admitting: Orthopedic Surgery

## 2015-02-19 DIAGNOSIS — Z6835 Body mass index (BMI) 35.0-35.9, adult: Secondary | ICD-10-CM | POA: Diagnosis not present

## 2015-02-19 DIAGNOSIS — J45909 Unspecified asthma, uncomplicated: Secondary | ICD-10-CM | POA: Diagnosis not present

## 2015-02-19 DIAGNOSIS — D869 Sarcoidosis, unspecified: Secondary | ICD-10-CM | POA: Diagnosis not present

## 2015-02-19 DIAGNOSIS — Z888 Allergy status to other drugs, medicaments and biological substances status: Secondary | ICD-10-CM | POA: Diagnosis not present

## 2015-02-19 DIAGNOSIS — Z882 Allergy status to sulfonamides status: Secondary | ICD-10-CM | POA: Diagnosis not present

## 2015-02-19 DIAGNOSIS — J449 Chronic obstructive pulmonary disease, unspecified: Secondary | ICD-10-CM | POA: Diagnosis not present

## 2015-02-19 DIAGNOSIS — Z79899 Other long term (current) drug therapy: Secondary | ICD-10-CM | POA: Diagnosis not present

## 2015-02-19 DIAGNOSIS — G5601 Carpal tunnel syndrome, right upper limb: Secondary | ICD-10-CM | POA: Diagnosis present

## 2015-02-19 DIAGNOSIS — M47812 Spondylosis without myelopathy or radiculopathy, cervical region: Secondary | ICD-10-CM | POA: Diagnosis not present

## 2015-02-19 DIAGNOSIS — K219 Gastro-esophageal reflux disease without esophagitis: Secondary | ICD-10-CM | POA: Diagnosis not present

## 2015-02-19 DIAGNOSIS — G4730 Sleep apnea, unspecified: Secondary | ICD-10-CM | POA: Diagnosis not present

## 2015-02-19 DIAGNOSIS — E119 Type 2 diabetes mellitus without complications: Secondary | ICD-10-CM | POA: Diagnosis not present

## 2015-02-19 DIAGNOSIS — E785 Hyperlipidemia, unspecified: Secondary | ICD-10-CM | POA: Diagnosis not present

## 2015-02-19 DIAGNOSIS — E0590 Thyrotoxicosis, unspecified without thyrotoxic crisis or storm: Secondary | ICD-10-CM | POA: Diagnosis not present

## 2015-02-19 DIAGNOSIS — Z87891 Personal history of nicotine dependence: Secondary | ICD-10-CM | POA: Diagnosis not present

## 2015-02-19 DIAGNOSIS — E559 Vitamin D deficiency, unspecified: Secondary | ICD-10-CM | POA: Diagnosis not present

## 2015-02-19 LAB — BASIC METABOLIC PANEL
ANION GAP: 6 (ref 5–15)
BUN: 14 mg/dL (ref 6–20)
CALCIUM: 9.4 mg/dL (ref 8.9–10.3)
CO2: 29 mmol/L (ref 22–32)
Chloride: 105 mmol/L (ref 101–111)
Creatinine, Ser: 0.73 mg/dL (ref 0.44–1.00)
GLUCOSE: 105 mg/dL — AB (ref 65–99)
Potassium: 4.2 mmol/L (ref 3.5–5.1)
Sodium: 140 mmol/L (ref 135–145)

## 2015-02-20 ENCOUNTER — Ambulatory Visit (HOSPITAL_BASED_OUTPATIENT_CLINIC_OR_DEPARTMENT_OTHER): Payer: PRIVATE HEALTH INSURANCE | Admitting: Certified Registered"

## 2015-02-20 ENCOUNTER — Encounter (HOSPITAL_BASED_OUTPATIENT_CLINIC_OR_DEPARTMENT_OTHER)
Admission: RE | Disposition: A | Discharge status: Home / Self Care | Payer: Self-pay | Source: Ambulatory Visit | Attending: Orthopedic Surgery

## 2015-02-20 ENCOUNTER — Encounter (HOSPITAL_BASED_OUTPATIENT_CLINIC_OR_DEPARTMENT_OTHER): Payer: Self-pay | Admitting: Orthopedic Surgery

## 2015-02-20 ENCOUNTER — Ambulatory Visit (HOSPITAL_BASED_OUTPATIENT_CLINIC_OR_DEPARTMENT_OTHER)
Admission: RE | Admit: 2015-02-20 | Discharge: 2015-02-20 | Disposition: A | Discharge status: Home / Self Care | Payer: PRIVATE HEALTH INSURANCE | Source: Ambulatory Visit | Attending: Orthopedic Surgery | Admitting: Orthopedic Surgery

## 2015-02-20 DIAGNOSIS — D869 Sarcoidosis, unspecified: Secondary | ICD-10-CM | POA: Insufficient documentation

## 2015-02-20 DIAGNOSIS — M47812 Spondylosis without myelopathy or radiculopathy, cervical region: Secondary | ICD-10-CM | POA: Insufficient documentation

## 2015-02-20 DIAGNOSIS — K219 Gastro-esophageal reflux disease without esophagitis: Secondary | ICD-10-CM | POA: Insufficient documentation

## 2015-02-20 DIAGNOSIS — G4730 Sleep apnea, unspecified: Secondary | ICD-10-CM | POA: Insufficient documentation

## 2015-02-20 DIAGNOSIS — J449 Chronic obstructive pulmonary disease, unspecified: Secondary | ICD-10-CM | POA: Insufficient documentation

## 2015-02-20 DIAGNOSIS — Z79899 Other long term (current) drug therapy: Secondary | ICD-10-CM | POA: Insufficient documentation

## 2015-02-20 DIAGNOSIS — G5601 Carpal tunnel syndrome, right upper limb: Secondary | ICD-10-CM | POA: Diagnosis not present

## 2015-02-20 DIAGNOSIS — E785 Hyperlipidemia, unspecified: Secondary | ICD-10-CM | POA: Insufficient documentation

## 2015-02-20 DIAGNOSIS — Z87891 Personal history of nicotine dependence: Secondary | ICD-10-CM | POA: Insufficient documentation

## 2015-02-20 DIAGNOSIS — E119 Type 2 diabetes mellitus without complications: Secondary | ICD-10-CM | POA: Insufficient documentation

## 2015-02-20 DIAGNOSIS — Z882 Allergy status to sulfonamides status: Secondary | ICD-10-CM | POA: Insufficient documentation

## 2015-02-20 DIAGNOSIS — Z6835 Body mass index (BMI) 35.0-35.9, adult: Secondary | ICD-10-CM | POA: Insufficient documentation

## 2015-02-20 DIAGNOSIS — E0590 Thyrotoxicosis, unspecified without thyrotoxic crisis or storm: Secondary | ICD-10-CM | POA: Insufficient documentation

## 2015-02-20 DIAGNOSIS — J45909 Unspecified asthma, uncomplicated: Secondary | ICD-10-CM | POA: Insufficient documentation

## 2015-02-20 DIAGNOSIS — Z888 Allergy status to other drugs, medicaments and biological substances status: Secondary | ICD-10-CM | POA: Insufficient documentation

## 2015-02-20 DIAGNOSIS — E559 Vitamin D deficiency, unspecified: Secondary | ICD-10-CM | POA: Insufficient documentation

## 2015-02-20 HISTORY — DX: Sarcoidosis, unspecified: D86.9

## 2015-02-20 HISTORY — DX: Cardiac arrhythmia, unspecified: I49.9

## 2015-02-20 HISTORY — DX: Gastro-esophageal reflux disease without esophagitis: K21.9

## 2015-02-20 HISTORY — DX: Sleep apnea, unspecified: G47.30

## 2015-02-20 HISTORY — DX: Chronic obstructive pulmonary disease, unspecified: J44.9

## 2015-02-20 HISTORY — PX: CARPAL TUNNEL RELEASE: SHX101

## 2015-02-20 LAB — GLUCOSE, CAPILLARY
Glucose-Capillary: 109 mg/dL — ABNORMAL HIGH (ref 65–99)
Glucose-Capillary: 101 mg/dL — ABNORMAL HIGH (ref 65–99)

## 2015-02-20 LAB — POCT HEMOGLOBIN-HEMACUE: HEMOGLOBIN: 13 g/dL (ref 12.0–15.0)

## 2015-02-20 SURGERY — CARPAL TUNNEL RELEASE
Anesthesia: General | Site: Wrist | Laterality: Right

## 2015-02-20 MED ORDER — MIDAZOLAM HCL 2 MG/2ML IJ SOLN
1.0000 mg | INTRAMUSCULAR | Status: DC | PRN
  Administered 2015-02-20: 1 mg via INTRAVENOUS

## 2015-02-20 MED ORDER — FENTANYL CITRATE (PF) 100 MCG/2ML IJ SOLN: 25.0000 ug | INTRAMUSCULAR | Status: DC | PRN

## 2015-02-20 MED ORDER — HYDROCODONE-ACETAMINOPHEN 5-325 MG PO TABS: 1.0000 | ORAL_TABLET | Freq: Four times a day (QID) | ORAL | Status: DC | PRN

## 2015-02-20 MED ORDER — MIDAZOLAM HCL 2 MG/2ML IJ SOLN
INTRAMUSCULAR | Status: AC
  Filled 2015-02-20: qty 2

## 2015-02-20 MED ORDER — CEFAZOLIN SODIUM-DEXTROSE 2-3 GM-% IV SOLR: 2.0000 g | INTRAVENOUS | Status: DC

## 2015-02-20 MED ORDER — LACTATED RINGERS IV SOLN
INTRAVENOUS | Status: DC
  Administered 2015-02-20: 08:00:00 via INTRAVENOUS

## 2015-02-20 MED ORDER — MEPERIDINE HCL 25 MG/ML IJ SOLN: 6.2500 mg | INTRAMUSCULAR | Status: DC | PRN

## 2015-02-20 MED ORDER — PROPOFOL 500 MG/50ML IV EMUL
INTRAVENOUS | Status: AC
  Filled 2015-02-20: qty 50

## 2015-02-20 MED ORDER — BUPIVACAINE HCL (PF) 0.25 % IJ SOLN
INTRAMUSCULAR | Status: DC | PRN
  Administered 2015-02-20: 8 mL

## 2015-02-20 MED ORDER — PROPOFOL INFUSION 10 MG/ML OPTIME
INTRAVENOUS | Status: DC | PRN
  Administered 2015-02-20: 75 ug/kg/min via INTRAVENOUS

## 2015-02-20 MED ORDER — BUPIVACAINE HCL (PF) 0.25 % IJ SOLN
INTRAMUSCULAR | Status: AC
  Filled 2015-02-20: qty 150

## 2015-02-20 MED ORDER — LIDOCAINE HCL (CARDIAC) 20 MG/ML IV SOLN
INTRAVENOUS | Status: DC | PRN
  Administered 2015-02-20: 30 mg via INTRAVENOUS

## 2015-02-20 MED ORDER — GLYCOPYRROLATE 0.2 MG/ML IJ SOLN: 0.2000 mg | Freq: Once | INTRAMUSCULAR | Status: DC | PRN

## 2015-02-20 MED ORDER — LIDOCAINE HCL (PF) 0.5 % IJ SOLN
INTRAMUSCULAR | Status: DC | PRN
  Administered 2015-02-20: 30 mL via INTRAVENOUS

## 2015-02-20 MED ORDER — CHLORHEXIDINE GLUCONATE 4 % EX LIQD: 60.0000 mL | Freq: Once | CUTANEOUS | Status: DC

## 2015-02-20 MED ORDER — ONDANSETRON HCL 4 MG/2ML IJ SOLN
INTRAMUSCULAR | Status: DC | PRN
  Administered 2015-02-20: 4 mg via INTRAVENOUS

## 2015-02-20 MED ORDER — CEFAZOLIN SODIUM-DEXTROSE 2-3 GM-% IV SOLR
INTRAVENOUS | Status: AC
  Filled 2015-02-20: qty 50

## 2015-02-20 MED ORDER — FENTANYL CITRATE (PF) 100 MCG/2ML IJ SOLN
50.0000 ug | INTRAMUSCULAR | Status: DC | PRN
  Administered 2015-02-20: 50 ug via INTRAVENOUS

## 2015-02-20 MED ORDER — SCOPOLAMINE 1 MG/3DAYS TD PT72: 1.0000 | MEDICATED_PATCH | Freq: Once | TRANSDERMAL | Status: DC | PRN

## 2015-02-20 MED ORDER — FENTANYL CITRATE (PF) 100 MCG/2ML IJ SOLN
INTRAMUSCULAR | Status: AC
  Filled 2015-02-20: qty 2

## 2015-02-20 MED ORDER — CEFAZOLIN SODIUM-DEXTROSE 2-3 GM-% IV SOLR
2.0000 g | INTRAVENOUS | Status: AC
  Administered 2015-02-20: 2 g via INTRAVENOUS

## 2015-02-20 SURGICAL SUPPLY — 34 items
BLADE SURG 15 STRL LF DISP TIS (BLADE) ×1 IMPLANT
BLADE SURG 15 STRL SS (BLADE) ×1
BNDG COHESIVE 3X5 TAN STRL LF (GAUZE/BANDAGES/DRESSINGS) ×2 IMPLANT
BNDG ESMARK 4X9 LF (GAUZE/BANDAGES/DRESSINGS) ×2 IMPLANT
BNDG GAUZE ELAST 4 BULKY (GAUZE/BANDAGES/DRESSINGS) ×2 IMPLANT
CHLORAPREP W/TINT 26ML (MISCELLANEOUS) ×2 IMPLANT
CORDS BIPOLAR (ELECTRODE) ×2 IMPLANT
COVER BACK TABLE 60X90IN (DRAPES) ×2 IMPLANT
COVER MAYO STAND STRL (DRAPES) ×2 IMPLANT
CUFF TOURNIQUET SINGLE 18IN (TOURNIQUET CUFF) ×2 IMPLANT
DRAPE EXTREMITY T 121X128X90 (DRAPE) ×2 IMPLANT
DRAPE SURG 17X23 STRL (DRAPES) ×2 IMPLANT
DRSG PAD ABDOMINAL 8X10 ST (GAUZE/BANDAGES/DRESSINGS) ×2 IMPLANT
GAUZE SPONGE 4X4 12PLY STRL (GAUZE/BANDAGES/DRESSINGS) ×2 IMPLANT
GAUZE XEROFORM 1X8 LF (GAUZE/BANDAGES/DRESSINGS) ×2 IMPLANT
GLOVE BIOGEL PI IND STRL 7.0 (GLOVE) ×2 IMPLANT
GLOVE BIOGEL PI IND STRL 8.5 (GLOVE) ×1 IMPLANT
GLOVE BIOGEL PI INDICATOR 7.0 (GLOVE) ×2
GLOVE BIOGEL PI INDICATOR 8.5 (GLOVE) ×1
GLOVE ECLIPSE 6.5 STRL STRAW (GLOVE) ×2 IMPLANT
GLOVE SURG ORTHO 8.0 STRL STRW (GLOVE) ×2 IMPLANT
GOWN STRL REUS W/ TWL LRG LVL3 (GOWN DISPOSABLE) ×1 IMPLANT
GOWN STRL REUS W/TWL LRG LVL3 (GOWN DISPOSABLE) ×1
GOWN STRL REUS W/TWL XL LVL3 (GOWN DISPOSABLE) ×2 IMPLANT
NEEDLE PRECISIONGLIDE 27X1.5 (NEEDLE) ×2 IMPLANT
NS IRRIG 1000ML POUR BTL (IV SOLUTION) ×2 IMPLANT
PACK BASIN DAY SURGERY FS (CUSTOM PROCEDURE TRAY) ×2 IMPLANT
STOCKINETTE 4X48 STRL (DRAPES) ×2 IMPLANT
SUT ETHILON 4 0 PS 2 18 (SUTURE) ×2 IMPLANT
SUT VICRYL 4-0 PS2 18IN ABS (SUTURE) IMPLANT
SYR BULB 3OZ (MISCELLANEOUS) ×2 IMPLANT
SYR CONTROL 10ML LL (SYRINGE) ×2 IMPLANT
TOWEL OR 17X24 6PK STRL BLUE (TOWEL DISPOSABLE) ×2 IMPLANT
UNDERPAD 30X30 (UNDERPADS AND DIAPERS) IMPLANT

## 2015-02-20 NOTE — Op Note (Signed)
Dictation Number [id number]

## 2015-02-20 NOTE — Brief Op Note (Signed)
02/20/2015  9:10 AM  PATIENT:  Lindsey Copeland  54 y.o. female  PRE-OPERATIVE DIAGNOSIS:  RIGHT CARPAL TUNNEL SYNDROME  POST-OPERATIVE DIAGNOSIS:  RIGHT CARPAL TUNNEL SYNDROME  PROCEDURE:  Procedure(s) with comments: RIGHT CARPAL TUNNEL RELEASE (Right) - ANESTHESIA:  IV REGIONAL FAB  SURGEON:  Surgeon(s) and Role:    * Cindee Salt, MD - Primary  PHYSICIAN ASSISTANT:   ASSISTANTS: none   ANESTHESIA:   local and regional  EBL:  Total I/O In: 600 [I.V.:600] Out: -   BLOOD ADMINISTERED:none  DRAINS: none   LOCAL MEDICATIONS USED:  BUPIVICAINE   SPECIMEN:  No Specimen  DISPOSITION OF SPECIMEN:  N/A  COUNTS:  YES  TOURNIQUET:   Total Tourniquet Time Documented: Forearm (Right) - 22 minutes Total: Forearm (Right) - 22 minutes   DICTATION: .Other Dictation: Dictation Number [id number]  PLAN OF CARE: Discharge to home after PACU  PATIENT DISPOSITION:  PACU - hemodynamically stable.

## 2015-02-20 NOTE — Anesthesia Procedure Notes (Addendum)
Anesthesia Regional Block:  Bier block (IV Regional)  Pre-Anesthetic Checklist: ,, timeout performed, Correct Patient, Correct Site, Correct Laterality, Correct Procedure,, site marked, surgical consent,, at surgeon's request Needles:  Injection technique: Single-shot  Needle Type: Other      Needle Gauge: 20 and 20 G    Additional Needles: Bier block (IV Regional) Narrative:   Performed by: Personally    Procedure Name: MAC Date/Time: 02/20/2015 8:38 AM Performed by: Aubryanna Nesheim D Pre-anesthesia Checklist: Patient identified, Emergency Drugs available, Suction available, Patient being monitored and Timeout performed Patient Re-evaluated:Patient Re-evaluated prior to inductionOxygen Delivery Method: Simple face mask

## 2015-02-20 NOTE — Transfer of Care (Signed)
Immediate Anesthesia Transfer of Care Note  Patient: Lindsey Copeland  Procedure(s) Performed: Procedure(s) with comments: RIGHT CARPAL TUNNEL RELEASE (Right) - ANESTHESIA:  IV REGIONAL FAB  Patient Location: PACU  Anesthesia Type:Bier block  Level of Consciousness: awake, alert , oriented and patient cooperative  Airway & Oxygen Therapy: Patient Spontanous Breathing and Patient connected to face mask oxygen  Post-op Assessment: Report given to RN and Post -op Vital signs reviewed and stable  Post vital signs: Reviewed and stable  Last Vitals:  Filed Vitals:   02/20/15 0723  BP: 129/69  Pulse: 83  Temp: 36.8 C  Resp: 18    Complications: No apparent anesthesia complications

## 2015-02-20 NOTE — Op Note (Signed)
NAMEElita Copeland NO.:  [id number]  MEDICAL RECORD NO.:  [id number]  LOCATION:                                 FACILITY:  PHYSICIAN:  Cindee Salt, M.D.            DATE OF BIRTH:  DATE OF PROCEDURE:  02/20/2015 DATE OF DISCHARGE:                              OPERATIVE REPORT   PREOPERATIVE DIAGNOSIS:  Carpal tunnel syndrome, right hand.  POSTOPERATIVE DIAGNOSIS:  Carpal tunnel syndrome, right hand.  OPERATION:  Decompression, right median nerve.  SURGEON:  Cindee Salt, MD.  ASSISTANT:  None.  ANESTHESIA:  Forearm-based IV regional with local infiltration.  ANESTHESIOLOGIST:  Crews.  HISTORY:  The patient is a 54 year old female with a history of carpal tunnel syndrome, nerve conduction is positive.  This has not responded to conservative treatment.  She has elected undergo surgical decompression of the median nerve, right hand.  Preoperative, perioperative, and postoperative courses have been discussed along with risks and complications.  She is aware that there is no guarantee with the surgery, possibility of infection; recurrence of injury to arteries, nerves, tendons; incomplete relief of symptoms, dystrophy.  In preoperative area, the patient was seen, the extremity was marked by both the patient and surgeon.  Antibiotics were given.  PROCEDURE IN DETAIL:  The patient was brought to the operating room, where a forearm-based IV regional anesthetic was carried out without difficulty.  She was prepped using ChloraPrep, supine position with the right arm free.  A 3-minute dry time was allowed.  Time-out was taken confirming the patient and procedure.  A longitudinal incision was made in the right palm, carried down through subcutaneous tissue.  Bleeders were electrocauterized with bipolar.  The palmar fascia was split. Superficial palmar arch identified.  The flexor tendon to the ring and little finger identified to the ulnar side of median nerve.   The carpal retinaculum was incised with sharp dissection.  Right angle and Sewall retractor were placed between skin and forearm fascia.  The fascia was released for approximately a centimeter and half proximal to the wrist crease under direct vision.  The canal was explored.  Air compression to the nerve was apparent.  Motor branch entered centrally distally.  The wound was copiously irrigated with saline, and the skin was closed with interrupted 4-0 nylon sutures.  Local infiltration with 0.25% bupivacaine without epinephrine was given, 8 mL was used.  Sterile compressive dressing with the fingers free was applied.  On deflation of the tourniquet, all fingers immediately pinked.  She was taken to the recovery room for observation in satisfactory condition. She will be discharged home to return to the [facility] in 1 week on [facility].          ______________________________ Cindee Salt, M.D.     GK/MEDQ  D:  02/20/2015  T:  02/20/2015  Job:  [id number]
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
## 2015-02-20 NOTE — Discharge Instructions (Addendum)

## 2015-02-20 NOTE — Anesthesia Preprocedure Evaluation (Signed)
Anesthesia Evaluation  Patient identified by MRN, date of birth, ID band Patient awake    Reviewed: Allergy & Precautions, NPO status , Patient's Chart, lab work & pertinent test results  Airway Mallampati: I  TM Distance: >3 FB Neck ROM: Full    Dental  (+) Teeth Intact, Dental Advisory Given   Pulmonary asthma , sleep apnea and Continuous Positive Airway Pressure Ventilation , COPD COPD inhaler, former smoker,  breath sounds clear to auscultation        Cardiovascular Rhythm:Regular Rate:Normal     Neuro/Psych    GI/Hepatic   Endo/Other  diabetes, Well Controlled, Type 2, Oral Hypoglycemic AgentsMorbid obesity  Renal/GU      Musculoskeletal   Abdominal   Peds  Hematology   Anesthesia Other Findings   Reproductive/Obstetrics                             Anesthesia Physical Anesthesia Plan  ASA: III  Anesthesia Plan: General   Post-op Pain Management:    Induction: Intravenous  Airway Management Planned: Simple Face Mask  Additional Equipment:   Intra-op Plan:   Post-operative Plan:   Informed Consent: I have reviewed the patients History and Physical, chart, labs and discussed the procedure including the risks, benefits and alternatives for the proposed anesthesia with the patient or authorized representative who has indicated his/her understanding and acceptance.   Dental advisory given  Plan Discussed with: CRNA, Anesthesiologist and Surgeon  Anesthesia Plan Comments:         Anesthesia Quick Evaluation

## 2015-02-20 NOTE — Anesthesia Postprocedure Evaluation (Signed)
  Anesthesia Post-op Note  Patient: Lindsey Copeland  Procedure(s) Performed: Procedure(s) with comments: RIGHT CARPAL TUNNEL RELEASE (Right) - ANESTHESIA:  IV REGIONAL FAB  Patient Location: PACU  Anesthesia Type: General   Level of Consciousness: awake, alert  and oriented  Airway and Oxygen Therapy: Patient Spontanous Breathing  Post-op Pain: none  Post-op Assessment: Post-op Vital signs reviewed  Post-op Vital Signs: Reviewed  Last Vitals:  Filed Vitals:   02/20/15 1045  BP: 125/51  Pulse: 62  Temp: 36.5 C  Resp: 16    Complications: No apparent anesthesia complications

## 2015-02-21 ENCOUNTER — Encounter (HOSPITAL_BASED_OUTPATIENT_CLINIC_OR_DEPARTMENT_OTHER): Payer: Self-pay | Admitting: Orthopedic Surgery

## 2015-04-10 ENCOUNTER — Ambulatory Visit: Payer: PRIVATE HEALTH INSURANCE | Admitting: Adult Health

## 2015-04-24 ENCOUNTER — Ambulatory Visit (INDEPENDENT_AMBULATORY_CARE_PROVIDER_SITE_OTHER): Payer: PRIVATE HEALTH INSURANCE | Admitting: Adult Health

## 2015-04-24 ENCOUNTER — Encounter: Payer: Self-pay | Admitting: Adult Health

## 2015-04-24 VITALS — BP 118/86 | HR 71 | Temp 98.2°F | Ht 62.0 in | Wt 196.0 lb

## 2015-04-24 DIAGNOSIS — G4733 Obstructive sleep apnea (adult) (pediatric): Secondary | ICD-10-CM

## 2015-04-24 DIAGNOSIS — D862 Sarcoidosis of lung with sarcoidosis of lymph nodes: Secondary | ICD-10-CM | POA: Diagnosis not present

## 2015-04-24 DIAGNOSIS — B370 Candidal stomatitis: Secondary | ICD-10-CM | POA: Insufficient documentation

## 2015-04-24 MED ORDER — CLOTRIMAZOLE 10 MG MT TROC: 10.0000 mg | Freq: Every day | OROMUCOSAL | Status: DC

## 2015-04-24 MED ORDER — PREDNISONE 10 MG PO TABS: ORAL_TABLET | ORAL | Status: DC

## 2015-04-24 NOTE — Progress Notes (Signed)
Subjective:    Patient ID: Lindsey EchevariaJanie Copeland, female    DOB: 11/05/1960, 54 y.o.   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  She had asthma as a child, outgrew it and then symptoms returned after a chest cold in winter of 2013. She has Graves' disease status post radioactive iodine.    Significant tests/ events  01/2013 PSG -wt 190 pounds, BMI 34 showed predominant hypopneas during supine sleep and with AHI of 16 events per hour. Nonsupine AHI was only 3 events per hour.  01/2013 >> started on auto CPAP with a medium fullface mask in, had initial difficulty but has now adjusted  Download 05/28/13 - avg use 4h, avg pr 14 cm, no residuals or leak 05/2013 download on auto 5-15 -avg pr 15   PFTs 07/2012 nml lung volumes, no obs, DLCO 71% CT chest 05/2013 Mediastinal/bilateral hilar lymphadenopathy, Peribronchovascular nodularity throughout all lobes, numerous  lesions in the spleen. CXR 04/2014 decreased LNs   04/24/2015 Follow up : Sarcoid and Mod OSA  Presents for a  6 month follow up . Says she has been having more cough lately.  Had recent flare after exposure to strong paint smell at work.  Still having a lot of drainage, dry cough , tightness and dyspnea.  Has been using more SABA over last couple of weeks.  Was treated with PCP with amoxicillin for bronchiits /sinsusits .  Does feel some better but cough and drainage persist.  No chest pain, orthopnea, edema or fever.  Not wearing CPAP , encouraged on compliance . And wt loss.   Review of Systems  Constitutional:   No  weight loss, night sweats,  Fevers, chills, fatigue, or  lassitude.  HEENT:   No headaches,  Difficulty swallowing,  Tooth/dental problems, or  Sore throat,                No sneezing, itching, ear ache,  +nasal congestion, post nasal drip,   CV:  No chest pain,  Orthopnea, PND, swelling in lower  extremities, anasarca, dizziness, palpitations, syncope.   GI  No heartburn, indigestion, abdominal pain, nausea, vomiting, diarrhea, change in bowel habits, loss of appetite, bloody stools.   Resp: .  No chest wall deformity  Skin: no rash or lesions.  GU: no dysuria, change in color of urine, no urgency or frequency.  No flank pain, no hematuria   MS:  No joint pain or swelling.  No decreased range of motion.  No back pain.  Psych:  No change in mood or affect. No depression or anxiety.  No memory loss.          Objective:   Physical Exam  GEN: A/Ox3; pleasant , NAD, obese   HEENT:  [place]/AT,  EACs-clear, TMs-wnl, NOSE-clear, THROAT-clear, no lesions, no postnasal drip or exudate noted. , few white patches noted   NECK:  Supple w/ fair ROM; no JVD; normal carotid impulses w/o bruits; no thyromegaly or nodules palpated; no lymphadenopathy.  RESP  Trace exp wheeze .no accessory muscle use, no dullness to percussion  CARD:  RRR, no m/r/g  , no peripheral edema, pulses intact, no cyanosis or clubbing.  GI:   Soft & nt; nml bowel sounds; no organomegaly or masses detected.  Musco: Warm bil, no deformities or joint swelling noted.   Neuro: alert, no focal deficits noted.    Skin: Warm, no  lesions or rashes        Assessment & Plan:

## 2015-04-24 NOTE — Addendum Note (Signed)
Addended by: Karalee HeightOX, Carlis Burnsworth P on: 04/24/2015 04:32 PM   Modules accepted: Orders

## 2015-04-24 NOTE — Assessment & Plan Note (Signed)
Mild flare  Check cxr   Plan  Prednisone taper over next week.  May use Allegra 180mg  daily ,As needed  Drainage  Mycelex troche five times daily for 1 week.  Rinse after symbicort.  follow up Dr. Vassie LollAlva  In 2-3 weeks and As needed   Please contact office for sooner follow up if symptoms do not improve or worsen or seek emergency care

## 2015-04-24 NOTE — Assessment & Plan Note (Signed)
Mycelex troche five times daily for 1 week.  Rinse after symbicort.  follow up Dr. Vassie LollAlva  In 2-3 weeks and As needed   Please contact office for sooner follow up if symptoms do not improve or worsen or seek emergency care

## 2015-04-24 NOTE — Assessment & Plan Note (Signed)
Restart CPAP At bedtime   Work on wt loss

## 2015-04-24 NOTE — Patient Instructions (Addendum)
Prednisone taper over next week.  May use Allegra 180mg  daily ,As needed  Drainage  Mycelex troche five times daily for 1 week.  Rinse after symbicort.  Restart CPAP At bedtime   follow up Dr. Vassie LollAlva  In 2-3 months and and As needed   Please contact office for sooner follow up if symptoms do not improve or worsen or seek emergency care

## 2015-04-25 NOTE — Progress Notes (Signed)
Reviewed & agree with plan  

## 2015-05-04 ENCOUNTER — Ambulatory Visit (INDEPENDENT_AMBULATORY_CARE_PROVIDER_SITE_OTHER)
Admission: RE | Admit: 2015-05-04 | Discharge: 2015-05-04 | Disposition: A | Discharge status: Home / Self Care | Payer: PRIVATE HEALTH INSURANCE | Source: Ambulatory Visit | Attending: Adult Health | Admitting: Adult Health

## 2015-05-04 DIAGNOSIS — D862 Sarcoidosis of lung with sarcoidosis of lymph nodes: Secondary | ICD-10-CM

## 2015-05-08 ENCOUNTER — Telehealth: Payer: Self-pay | Admitting: Adult Health

## 2015-05-08 NOTE — Telephone Encounter (Signed)
Notes Recorded by Julio Sicksammy S Parrett, NP on 05/08/2015 at 9:12 AM CXR is clear  Cont w/ ov recs  Please contact office for sooner follow up if symptoms do not improve or worsen or seek emergency care   Pt returned call. I reviewed results and recs. Pt stated that her symptoms had improved. She voiced understanding and had no further questions. Nothing further needed. Will sign off on message.

## 2015-05-08 NOTE — Progress Notes (Signed)
Quick Note:  LVM for pt to return call ______ 

## 2015-05-08 NOTE — Progress Notes (Signed)
Quick Note:  Pt returned call. Reviewed results and recs. Pt stated that her symptoms had improved. She voiced understanding and had no further questions. ______

## 2015-05-29 ENCOUNTER — Other Ambulatory Visit: Payer: Self-pay

## 2015-05-29 DIAGNOSIS — Z1231 Encounter for screening mammogram for malignant neoplasm of breast: Secondary | ICD-10-CM

## 2015-06-27 ENCOUNTER — Other Ambulatory Visit: Payer: Self-pay | Admitting: Critical Care Medicine

## 2015-06-28 ENCOUNTER — Other Ambulatory Visit: Payer: Self-pay | Admitting: Critical Care Medicine

## 2015-06-29 ENCOUNTER — Telehealth: Payer: Self-pay | Admitting: Pulmonary Disease

## 2015-06-29 MED ORDER — BUDESONIDE-FORMOTEROL FUMARATE 160-4.5 MCG/ACT IN AERO: INHALATION_SPRAY | RESPIRATORY_TRACT | Status: DC

## 2015-06-29 NOTE — Telephone Encounter (Signed)
Called spoke with pt. Aware RX for symbicort has been sent in. Nothing further needed 

## 2015-07-04 ENCOUNTER — Ambulatory Visit
Admission: RE | Admit: 2015-07-04 | Discharge: 2015-07-04 | Disposition: A | Discharge status: Home / Self Care | Payer: PRIVATE HEALTH INSURANCE | Source: Ambulatory Visit

## 2015-07-04 DIAGNOSIS — Z1231 Encounter for screening mammogram for malignant neoplasm of breast: Secondary | ICD-10-CM

## 2015-07-20 ENCOUNTER — Encounter: Payer: Self-pay | Admitting: Pulmonary Disease

## 2015-07-20 ENCOUNTER — Ambulatory Visit (INDEPENDENT_AMBULATORY_CARE_PROVIDER_SITE_OTHER): Admitting: Pulmonary Disease

## 2015-07-20 VITALS — BP 122/68 | HR 75 | Ht 62.0 in | Wt 200.6 lb

## 2015-07-20 DIAGNOSIS — D862 Sarcoidosis of lung with sarcoidosis of lymph nodes: Secondary | ICD-10-CM | POA: Diagnosis not present

## 2015-07-20 DIAGNOSIS — G4733 Obstructive sleep apnea (adult) (pediatric): Secondary | ICD-10-CM | POA: Diagnosis not present

## 2015-07-20 NOTE — Assessment & Plan Note (Signed)
Please  Be more consistent with CPAP use - at least 4-6h every night  Weight loss encouraged, compliance with goal of at least 4-6 hrs every night is the expectation. Advised against medications with sedative side effects Cautioned against driving when sleepy - understanding that sleepiness will vary on a day to day basis

## 2015-07-20 NOTE — Progress Notes (Signed)
   Subjective:    Patient ID: Lindsey Copeland, female    DOB: 1960/10/29, 55 y.o.   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  She had asthma as a child, outgrew it and then symptoms returned after a chest cold in winter of 2013. She has Graves' disease status post radioactive iodine.     07/20/2015  Chief Complaint  Patient presents with  . Follow-up    Pt c/o recent increase in SOB/cough/wheeze due to possible URI, symptoms are improving. Pt has been using CPAP regularly and does feel she sleeps better. Pt does report some mild daytime somnolence.     Resigned from job due to exposure to dust, sick patients  Etc And frequent flares Remains on symbicort Has been using more SABA over last couple of weeks.   She is now back on CPAP,Since her last visit  In 03/2015 after a period of noncompliance Download 07/2015 - no residuals, avg pr 14 on auto, usage 3.5h  Current weight is unchanged  I personally reviewed  imaging studies and compared to old films Also reviewed download    Significant tests/ events  01/2013 PSG -wt 190 pounds, BMI 34 showed predominant hypopneas during supine sleep and with AHI of 16 events per hour. Nonsupine AHI was only 3 events per hour.  01/2013 >> started on auto CPAP with a medium fullface mask in, had initial difficulty but has now adjusted  Download 05/28/13 - avg use 4h, avg pr 14 cm, no residuals or leak 05/2013 download on auto 5-15 -avg pr 15   PFTs 07/2012 nml lung volumes, no obs, DLCO 71% CT chest 05/2013 Mediastinal/bilateral hilar lymphadenopathy, Peribronchovascular nodularity throughout all lobes, numerous lesions in the spleen. CXR 05/2015 decreased LNs   Review of Systems Pt denies any significant  nasal congestion or excess secretions, fever, chills, sweats, unintended wt loss, pleuritic or exertional cp, orthopnea pnd or leg  swelling.  Pt also denies any obvious fluctuation in symptoms with weather or environmental change or other alleviating or aggravating factors.    Pt denies any increase in rescue therapy over baseline, denies waking up needing it or having early am exacerbations or coughing/wheezing/ or dyspnea      Objective:   Physical Exam  Gen. Pleasant, obese, in no distress ENT - no lesions, no post nasal drip Neck: No JVD, no thyromegaly, no carotid bruits Lungs: no use of accessory muscles, no dullness to percussion, decreased without rales or rhonchi  Cardiovascular: Rhythm regular, heart sounds  normal, no murmurs or gallops, no peripheral edema Musculoskeletal: No deformities, no cyanosis or clubbing , no tremors        Assessment & Plan:

## 2015-07-20 NOTE — Patient Instructions (Signed)
Sample of symbicort 160 x 1 Please  Be more consistent with CPAP use - at least 4-6h every night Good luck with job search

## 2015-07-20 NOTE — Assessment & Plan Note (Addendum)
Sample of symbicort 160 x 1 Stable  We discussed possible job options for her given her hypersensitivity to odors and wanting to avoid sick patients

## 2015-07-26 ENCOUNTER — Encounter: Payer: Self-pay | Admitting: Pulmonary Disease

## 2015-08-03 ENCOUNTER — Telehealth: Payer: Self-pay | Admitting: Pulmonary Disease

## 2015-08-03 MED ORDER — ALBUTEROL SULFATE HFA 108 (90 BASE) MCG/ACT IN AERS: 2.0000 | INHALATION_SPRAY | Freq: Four times a day (QID) | RESPIRATORY_TRACT | Status: DC | PRN

## 2015-08-03 MED ORDER — FLUTICASONE PROPIONATE 50 MCG/ACT NA SUSP: 2.0000 | Freq: Every day | NASAL | Status: DC | PRN

## 2015-08-03 MED ORDER — BUDESONIDE-FORMOTEROL FUMARATE 160-4.5 MCG/ACT IN AERO: INHALATION_SPRAY | RESPIRATORY_TRACT | Status: DC

## 2015-08-03 NOTE — Telephone Encounter (Signed)
Spoke with pt, states she is on a new drug plan and needs her medications to be sent to [facility] by mail at [phone] Needs written rx faxed with dob and last 4 digits of ssn on the rx- needs her symbicort, fluticasone, and ventolin.  This has been sent.  Nothing further needed.

## 2015-08-29 ENCOUNTER — Ambulatory Visit (INDEPENDENT_AMBULATORY_CARE_PROVIDER_SITE_OTHER): Admitting: Cardiology

## 2015-08-29 ENCOUNTER — Encounter: Payer: Self-pay | Admitting: Cardiology

## 2015-08-29 VITALS — BP 120/70 | HR 66 | Ht 62.5 in | Wt 197.8 lb

## 2015-08-29 DIAGNOSIS — K219 Gastro-esophageal reflux disease without esophagitis: Secondary | ICD-10-CM | POA: Diagnosis not present

## 2015-08-29 DIAGNOSIS — D862 Sarcoidosis of lung with sarcoidosis of lymph nodes: Secondary | ICD-10-CM

## 2015-08-29 DIAGNOSIS — R0789 Other chest pain: Secondary | ICD-10-CM | POA: Diagnosis not present

## 2015-08-29 DIAGNOSIS — R079 Chest pain, unspecified: Secondary | ICD-10-CM | POA: Insufficient documentation

## 2015-08-29 NOTE — Patient Instructions (Signed)
Medication Instructions:  Your physician recommends that you continue on your current medications as directed. Please refer to the Current Medication list given to you today.  Labwork: none  Testing/Procedures: Your physician has requested that you have a lexiscan myoview. For further information please visit [email or web address] Please follow instruction sheet, as given. WALKING LEXISCAN 2 DAY   Follow-Up: Your physician recommends that you schedule a follow-up appointment in: 1 MONTH OV WITH DR TURNER   If you need a refill on your cardiac medications before your next appointment, please call your pharmacy.

## 2015-08-29 NOTE — Progress Notes (Signed)
Cardiology Office Note   Date:  08/29/2015   ID:  Lindsey Copeland, DOB Jun 12, 1961, MRN [id number]  PCP:  Mickie Hillier, MD  Cardiologist: Cassell Clement MD  Chief Complaint  Patient presents with  . New Evaluation    chest pain      History of Present Illness: Lindsey Copeland is a 55 y.o. female who presents for  In evaluation of chest pain. She is  Medical patient of Dr. [name].  She does not have any prior history of known coronary disease.  In July 2013 she had a normal treadmill according to her history though we do not have those records.  We do have the records of the echocardiogram done on 01/15/12 at [facility] cardiology and her echocardiogram was normal with normal left ventricular systolic function and trivial regurgitation of mitral valve and pulmonic valve and mild tricuspid regurgitation.  Her ejection fraction was 58%.  Recently she has begun to experience over the past 2-3 weeks a mild left lateral chest discomfort. At times it radiates up to the left shoulder.  She has had difficulty climbing steps because of dyspnea.  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  Nonetheless, her chest symptoms have persisted even after she left her job.  Her chest discomfort can occur at rest as well as with exertion. The patient has also had a tendency toward increased fatigue recently and she notes diaphoresis when she climbs stairs.   She has also been concerned because her brother who is 3 years older than she is just had a heart attack.  The patient has multiple risk factors including diabetes and during been a former smoker.  She quit 12 years ago.  Family history reveals that her mother died of complications of diabetes. Father's health is unknown.  Her past medical history includes the fact that she has a history of sarcoidosis based upon a bronchoscopic biopsy.  Sarcoidosis is not felt to be active at  this time.  The patient had a normal chest x-ray on 05/04/15.  The patient also has a history of GERD.  She has a history of obstructive sleep apnea and COPD and uses a CPAP machine.  Dr. Vassie Loll is her pulmonologist.  She has a history of GERD.  She has a history of carpal tunnel syndrome and is scheduled for left carpal tunnel surgery next month by Dr. Cindee Salt.  Her diabetologist is Dr. Sharl Ma    Past Medical History  Diagnosis Date  . Carpal tunnel syndrome   . Hyperlipidemia   . Graves disease   . Hyperthyroidism   . Vitamin D deficiency   . Type 2 diabetes mellitus (HCC)   . Asthma   . Dysrhythmia     palpitations  . COPD (chronic obstructive pulmonary disease) (HCC)   . Sarcoidosis (HCC)   . Sleep apnea     CPAP  . GERD (gastroesophageal reflux disease)     Past Surgical History  Procedure Laterality Date  . Ulnar nerve repair    . Right knee arthroscopy    . Radioactive iodine treatment    . Tubal ligation    . Video bronchoscopy  07/30/2012    Procedure: VIDEO BRONCHOSCOPY WITH FLUORO;  Surgeon: Storm Frisk, MD;  Location: [facility];  Service: Cardiopulmonary;  Laterality: N/A;  . Carpal tunnel release Right 02/20/2015    Procedure: RIGHT CARPAL TUNNEL RELEASE;  Surgeon:  Cindee Salt, MD;  Location: [facility];  Service: Orthopedics;  Laterality: Right;  ANESTHESIA:  IV REGIONAL FAB     Current Outpatient Prescriptions  Medication Sig Dispense Refill  . albuterol (PROVENTIL HFA;VENTOLIN HFA) 108 (90 Base) MCG/ACT inhaler Inhale 2 puffs into the lungs every 6 (six) hours as needed. 18 g 11  . aspirin 81 MG tablet Take 81 mg by mouth daily.    Marland Kitchen atorvastatin (LIPITOR) 80 MG tablet Take 80 mg by mouth daily.    . budesonide-formoterol (SYMBICORT) 160-4.5 MCG/ACT inhaler INHALE TWO PUFFS INTO LUNGS TWICE DAILY 1 Inhaler 11  . Cholecalciferol (VITAMIN D-3) 1000 UNITS CAPS Take 1 capsule by mouth daily.     . fluticasone (FLONASE) 50 MCG/ACT nasal spray  Place 2 sprays into both nostrils daily as needed. 16 g 11  . ibuprofen (ADVIL,MOTRIN) 200 MG tablet Take 200 mg by mouth every 6 (six) hours as needed for mild pain.     . metFORMIN (GLUCOPHAGE) 500 MG tablet Take 500 mg by mouth 2 (two) times daily.    . Multiple Vitamin (MULTIVITAMIN) tablet Take 1 tablet by mouth daily.    Marland Kitchen omeprazole (PRILOSEC) 20 MG capsule Take 20 mg by mouth daily as needed (indigestion).      No current facility-administered medications for this visit.    Allergies:   Sulfa antibiotics and Robaxin    Social History:  The patient  reports that she quit smoking about 13 years ago. Her smoking use included Cigarettes. She has a 1 pack-year smoking history. She does not have any smokeless tobacco history on file. She reports that she drinks alcohol. She reports that she does not use illicit drugs.   Family History:  The patient's family history includes Asthma in her brother; Diabetes in her mother and sister; Glaucoma in her mother; Heart attack in her brother; Heart failure in her mother.    ROS:  Please see the history of present illness.   Otherwise, review of systems are positive for none.   All other systems are reviewed and negative.    PHYSICAL EXAM: VS:  BP 120/70 mmHg  Pulse 66  Ht 5' 2.5" (1.588 m)  Wt 197 lb 12.8 oz (89.721 kg)  BMI 35.58 kg/m2 , BMI Body mass index is 35.58 kg/(m^2). GEN: Well nourished, well developed, in no acute distress HEENT: normal Neck: no JVD, carotid bruits, or masses Cardiac: RRR; no murmurs, rubs, or gallops,no edema  Respiratory:  clear to auscultation bilaterally, normal work of breathing GI: soft, nontender, nondistended, + BS MS: no deformity or atrophy Skin: warm and dry, no rash Neuro:  Strength and sensation are intact Psych: euthymic mood, full affect   EKG:  EKG is ordered today. The ekg ordered today demonstrates  Normal sinus rhythm at 67/m. Borderline low voltage QRS. No ischemic changes at rest. Heart  rate is 67 bpm   Recent Labs: 02/19/2015: BUN 14; Creatinine, Ser 0.73; Potassium 4.2; Sodium 140 02/20/2015: Hemoglobin 13.0    Lipid Panel No results found for: CHOL, TRIG, HDL, CHOLHDL, VLDL, LDLCALC, LDLDIRECT    Wt Readings from Last 3 Encounters:  08/29/15 197 lb 12.8 oz (89.721 kg)  07/20/15 200 lb 9.6 oz (90.992 kg)  04/24/15 196 lb (88.905 kg)        ASSESSMENT AND PLAN:  1.  Left-sided chest pain of uncertain etiology 2.  Adult onset diabetes 3.  History of inactive pulmonary sarcoidosis 4.  Obstructive sleep apnea, uses CPAP machine 5.  History of GERD 6.  obesity   Current medicines are reviewed at length with the patient today.  The patient does not have concerns regarding medicines.  The following changes have been made:  no change  Labs/ tests ordered today include:   Orders Placed This Encounter  Procedures  . Myocardial Perfusion Imaging  . EKG 12-Lead     Disposition:    We will have her return for a 2 day protocol walking Lexiscan Myoview stress test to evaluate her chest pain further. Following my retirement we will have her follow-up with Dr. Mayford Knife whom she saw at [facility] cardiology about 4 years ago.  Karie Schwalbe MD 08/29/2015 5:16 PM    [facility] [address] [place], [place], [place]  [address] Phone: [phone]; Fax: [phone]

## 2015-09-05 ENCOUNTER — Telehealth (HOSPITAL_COMMUNITY): Payer: Self-pay | Admitting: *Deleted

## 2015-09-05 NOTE — Telephone Encounter (Signed)
Left message on voicemail in reference to upcoming appointment scheduled for 09/10/15. Phone number given for a call back so details instructions can be given. Fransheska Willingham J Khushboo Chuck, RN 

## 2015-09-06 ENCOUNTER — Telehealth (HOSPITAL_COMMUNITY): Payer: Self-pay | Admitting: *Deleted

## 2015-09-06 NOTE — Telephone Encounter (Signed)
Patient given detailed instructions per Myocardial Perfusion Study Information Sheet for the test on 09/10/15 Patient notified to arrive 15 minutes early and that it is imperative to arrive on time for appointment to keep from having the test rescheduled.  If you need to cancel or reschedule your appointment, please call the office within 24 hours of your appointment. Failure to do so may result in a cancellation of your appointment, and a $50 no show fee. Patient verbalized understanding.MaR Magdalene PatriciaJ Smiht, RN

## 2015-09-10 ENCOUNTER — Ambulatory Visit (HOSPITAL_COMMUNITY)

## 2015-09-11 ENCOUNTER — Ambulatory Visit (HOSPITAL_COMMUNITY)

## 2015-09-18 ENCOUNTER — Telehealth (HOSPITAL_COMMUNITY): Payer: Self-pay

## 2015-09-18 NOTE — Telephone Encounter (Signed)
Encounter complete. 

## 2015-09-20 ENCOUNTER — Ambulatory Visit (HOSPITAL_COMMUNITY)
Admission: RE | Admit: 2015-09-20 | Discharge: 2015-09-20 | Disposition: A | Discharge status: Home / Self Care | Source: Ambulatory Visit | Attending: Cardiology | Admitting: Cardiology

## 2015-09-20 DIAGNOSIS — Z6836 Body mass index (BMI) 36.0-36.9, adult: Secondary | ICD-10-CM | POA: Insufficient documentation

## 2015-09-20 DIAGNOSIS — Z8249 Family history of ischemic heart disease and other diseases of the circulatory system: Secondary | ICD-10-CM | POA: Diagnosis not present

## 2015-09-20 DIAGNOSIS — E119 Type 2 diabetes mellitus without complications: Secondary | ICD-10-CM | POA: Diagnosis not present

## 2015-09-20 DIAGNOSIS — E669 Obesity, unspecified: Secondary | ICD-10-CM | POA: Diagnosis not present

## 2015-09-20 DIAGNOSIS — Z87891 Personal history of nicotine dependence: Secondary | ICD-10-CM | POA: Diagnosis not present

## 2015-09-20 DIAGNOSIS — R0609 Other forms of dyspnea: Secondary | ICD-10-CM | POA: Insufficient documentation

## 2015-09-20 DIAGNOSIS — R5383 Other fatigue: Secondary | ICD-10-CM | POA: Insufficient documentation

## 2015-09-20 DIAGNOSIS — R0789 Other chest pain: Secondary | ICD-10-CM | POA: Diagnosis not present

## 2015-09-20 DIAGNOSIS — R079 Chest pain, unspecified: Secondary | ICD-10-CM

## 2015-09-20 MED ORDER — TECHNETIUM TC 99M SESTAMIBI GENERIC - CARDIOLITE
31.0000 | Freq: Once | INTRAVENOUS | Status: AC | PRN
  Administered 2015-09-20: 31 via INTRAVENOUS

## 2015-09-20 MED ORDER — AMINOPHYLLINE 25 MG/ML IV SOLN
75.0000 mg | Freq: Once | INTRAVENOUS | Status: AC
  Administered 2015-09-20: 75 mg via INTRAVENOUS

## 2015-09-20 MED ORDER — REGADENOSON 0.4 MG/5ML IV SOLN
0.4000 mg | Freq: Once | INTRAVENOUS | Status: AC
  Administered 2015-09-20: 0.4 mg via INTRAVENOUS

## 2015-09-21 ENCOUNTER — Ambulatory Visit (HOSPITAL_COMMUNITY)
Admission: RE | Admit: 2015-09-21 | Discharge: 2015-09-21 | Disposition: A | Discharge status: Home / Self Care | Source: Ambulatory Visit | Attending: Cardiology | Admitting: Cardiology

## 2015-09-21 LAB — MYOCARDIAL PERFUSION IMAGING
CHL CUP NUCLEAR SDS: 7
CHL CUP NUCLEAR SRS: 0
CHL CUP NUCLEAR SSS: 7
CHL CUP RESTING HR STRESS: 66 {beats}/min
LV dias vol: 53 mL (ref 46–106)
LV sys vol: 19 mL
NUC STRESS TID: 0.89
Peak HR: 114 {beats}/min

## 2015-09-21 MED ORDER — TECHNETIUM TC 99M SESTAMIBI GENERIC - CARDIOLITE
29.2000 | Freq: Once | INTRAVENOUS | Status: AC | PRN
  Administered 2015-09-21: 29.2 via INTRAVENOUS

## 2015-09-26 ENCOUNTER — Telehealth: Payer: Self-pay | Admitting: Cardiology

## 2015-09-26 NOTE — Telephone Encounter (Signed)
F/u   Pt returning RN phone call- test results. Please call back and discuss.   

## 2015-09-26 NOTE — Telephone Encounter (Signed)
-----   Message from Cassell Clementhomas Brackbill, MD sent at 09/23/2015  6:38 PM EDT ----- Please report.  Stress test is satisfactory.  No ischemia.  Good LV systolic function. Please send copy to PCP

## 2015-09-26 NOTE — Telephone Encounter (Signed)
Left message to call back  

## 2015-09-27 NOTE — Telephone Encounter (Signed)
Follow Up ° °Pt returned call//  °

## 2015-09-27 NOTE — Telephone Encounter (Signed)
Left message to call back  

## 2015-09-27 NOTE — Telephone Encounter (Signed)
Follow up ° ° °Pt is returning call to rn.  °

## 2015-09-27 NOTE — Telephone Encounter (Signed)
Advised patient of stress test

## 2015-10-08 ENCOUNTER — Other Ambulatory Visit: Payer: Self-pay | Admitting: Orthopedic Surgery

## 2015-10-08 ENCOUNTER — Encounter (HOSPITAL_BASED_OUTPATIENT_CLINIC_OR_DEPARTMENT_OTHER): Payer: Self-pay | Admitting: *Deleted

## 2015-10-09 ENCOUNTER — Encounter (HOSPITAL_BASED_OUTPATIENT_CLINIC_OR_DEPARTMENT_OTHER): Payer: Self-pay | Admitting: Orthopedic Surgery

## 2015-10-09 ENCOUNTER — Ambulatory Visit (HOSPITAL_BASED_OUTPATIENT_CLINIC_OR_DEPARTMENT_OTHER): Admitting: Anesthesiology

## 2015-10-09 ENCOUNTER — Encounter (HOSPITAL_BASED_OUTPATIENT_CLINIC_OR_DEPARTMENT_OTHER)
Admission: RE | Disposition: A | Discharge status: Home / Self Care | Payer: Self-pay | Source: Ambulatory Visit | Attending: Orthopedic Surgery

## 2015-10-09 ENCOUNTER — Ambulatory Visit (HOSPITAL_BASED_OUTPATIENT_CLINIC_OR_DEPARTMENT_OTHER)
Admission: RE | Admit: 2015-10-09 | Discharge: 2015-10-09 | Disposition: A | Discharge status: Home / Self Care | Source: Ambulatory Visit | Attending: Orthopedic Surgery | Admitting: Orthopedic Surgery

## 2015-10-09 DIAGNOSIS — G5602 Carpal tunnel syndrome, left upper limb: Secondary | ICD-10-CM | POA: Diagnosis present

## 2015-10-09 DIAGNOSIS — J449 Chronic obstructive pulmonary disease, unspecified: Secondary | ICD-10-CM | POA: Diagnosis not present

## 2015-10-09 DIAGNOSIS — E119 Type 2 diabetes mellitus without complications: Secondary | ICD-10-CM | POA: Insufficient documentation

## 2015-10-09 DIAGNOSIS — K219 Gastro-esophageal reflux disease without esophagitis: Secondary | ICD-10-CM | POA: Insufficient documentation

## 2015-10-09 DIAGNOSIS — Z6836 Body mass index (BMI) 36.0-36.9, adult: Secondary | ICD-10-CM | POA: Diagnosis not present

## 2015-10-09 DIAGNOSIS — E785 Hyperlipidemia, unspecified: Secondary | ICD-10-CM | POA: Diagnosis not present

## 2015-10-09 DIAGNOSIS — E669 Obesity, unspecified: Secondary | ICD-10-CM | POA: Insufficient documentation

## 2015-10-09 DIAGNOSIS — Z7984 Long term (current) use of oral hypoglycemic drugs: Secondary | ICD-10-CM | POA: Diagnosis not present

## 2015-10-09 DIAGNOSIS — Z87891 Personal history of nicotine dependence: Secondary | ICD-10-CM | POA: Insufficient documentation

## 2015-10-09 DIAGNOSIS — G4730 Sleep apnea, unspecified: Secondary | ICD-10-CM | POA: Insufficient documentation

## 2015-10-09 HISTORY — PX: CARPAL TUNNEL RELEASE: SHX101

## 2015-10-09 LAB — POCT I-STAT, CHEM 8
BUN: 17 mg/dL (ref 6–20)
CHLORIDE: 105 mmol/L (ref 101–111)
Calcium, Ion: 1.13 mmol/L (ref 1.12–1.23)
Creatinine, Ser: 0.6 mg/dL (ref 0.44–1.00)
Glucose, Bld: 93 mg/dL (ref 65–99)
HEMATOCRIT: 40 % (ref 36.0–46.0)
Hemoglobin: 13.6 g/dL (ref 12.0–15.0)
POTASSIUM: 3.7 mmol/L (ref 3.5–5.1)
SODIUM: 141 mmol/L (ref 135–145)
TCO2: 25 mmol/L (ref 0–100)

## 2015-10-09 LAB — GLUCOSE, CAPILLARY: Glucose-Capillary: 106 mg/dL — ABNORMAL HIGH (ref 65–99)

## 2015-10-09 SURGERY — CARPAL TUNNEL RELEASE
Anesthesia: Monitor Anesthesia Care | Site: Wrist | Laterality: Left

## 2015-10-09 MED ORDER — PROPOFOL 500 MG/50ML IV EMUL
INTRAVENOUS | Status: DC | PRN
  Administered 2015-10-09: 25 ug/kg/min via INTRAVENOUS

## 2015-10-09 MED ORDER — 0.9 % SODIUM CHLORIDE (POUR BTL) OPTIME
TOPICAL | Status: DC | PRN
  Administered 2015-10-09: 200 mL

## 2015-10-09 MED ORDER — CHLORHEXIDINE GLUCONATE 4 % EX LIQD: 60.0000 mL | Freq: Once | CUTANEOUS | Status: DC

## 2015-10-09 MED ORDER — KETOROLAC TROMETHAMINE 30 MG/ML IJ SOLN
INTRAMUSCULAR | Status: DC | PRN
  Administered 2015-10-09: 30 mg via INTRAVENOUS

## 2015-10-09 MED ORDER — LIDOCAINE HCL (PF) 0.5 % IJ SOLN
INTRAMUSCULAR | Status: DC | PRN
  Administered 2015-10-09: 30 mL via INTRAVENOUS

## 2015-10-09 MED ORDER — FENTANYL CITRATE (PF) 100 MCG/2ML IJ SOLN
INTRAMUSCULAR | Status: AC
  Filled 2015-10-09: qty 2

## 2015-10-09 MED ORDER — BUPIVACAINE HCL (PF) 0.25 % IJ SOLN
INTRAMUSCULAR | Status: DC | PRN
  Administered 2015-10-09: 7 mL

## 2015-10-09 MED ORDER — MIDAZOLAM HCL 2 MG/2ML IJ SOLN
INTRAMUSCULAR | Status: AC
  Filled 2015-10-09: qty 2

## 2015-10-09 MED ORDER — ONDANSETRON HCL 4 MG/2ML IJ SOLN
INTRAMUSCULAR | Status: DC | PRN
  Administered 2015-10-09: 4 mg via INTRAVENOUS

## 2015-10-09 MED ORDER — CEFAZOLIN SODIUM-DEXTROSE 2-4 GM/100ML-% IV SOLN
2.0000 g | INTRAVENOUS | Status: AC
  Administered 2015-10-09: 2 g via INTRAVENOUS

## 2015-10-09 MED ORDER — DEXAMETHASONE SODIUM PHOSPHATE 10 MG/ML IJ SOLN
INTRAMUSCULAR | Status: AC
  Filled 2015-10-09: qty 1

## 2015-10-09 MED ORDER — ONDANSETRON HCL 4 MG/2ML IJ SOLN
INTRAMUSCULAR | Status: AC
  Filled 2015-10-09: qty 2

## 2015-10-09 MED ORDER — MIDAZOLAM HCL 5 MG/5ML IJ SOLN
INTRAMUSCULAR | Status: DC | PRN
  Administered 2015-10-09: 2 mg via INTRAVENOUS

## 2015-10-09 MED ORDER — OXYCODONE HCL 5 MG PO TABS: 5.0000 mg | ORAL_TABLET | Freq: Once | ORAL | Status: DC | PRN

## 2015-10-09 MED ORDER — HYDROMORPHONE HCL 1 MG/ML IJ SOLN: 0.2500 mg | INTRAMUSCULAR | Status: DC | PRN

## 2015-10-09 MED ORDER — OXYCODONE HCL 5 MG/5ML PO SOLN: 5.0000 mg | Freq: Once | ORAL | Status: DC | PRN

## 2015-10-09 MED ORDER — KETOROLAC TROMETHAMINE 30 MG/ML IJ SOLN
INTRAMUSCULAR | Status: AC
  Filled 2015-10-09: qty 1

## 2015-10-09 MED ORDER — MEPERIDINE HCL 25 MG/ML IJ SOLN: 6.2500 mg | INTRAMUSCULAR | Status: DC | PRN

## 2015-10-09 MED ORDER — GLYCOPYRROLATE 0.2 MG/ML IJ SOLN
0.2000 mg | Freq: Once | INTRAMUSCULAR | Status: DC | PRN
Start: 2015-10-09 — End: 2015-10-09

## 2015-10-09 MED ORDER — LIDOCAINE HCL (CARDIAC) 20 MG/ML IV SOLN
INTRAVENOUS | Status: AC
  Filled 2015-10-09: qty 5

## 2015-10-09 MED ORDER — MIDAZOLAM HCL 2 MG/2ML IJ SOLN: 1.0000 mg | INTRAMUSCULAR | Status: DC | PRN

## 2015-10-09 MED ORDER — PROPOFOL 10 MG/ML IV BOLUS
INTRAVENOUS | Status: AC
  Filled 2015-10-09: qty 40

## 2015-10-09 MED ORDER — HYDROCODONE-ACETAMINOPHEN 5-325 MG PO TABS: 1.0000 | ORAL_TABLET | Freq: Four times a day (QID) | ORAL | Status: DC | PRN

## 2015-10-09 MED ORDER — FENTANYL CITRATE (PF) 100 MCG/2ML IJ SOLN
INTRAMUSCULAR | Status: DC | PRN
  Administered 2015-10-09: 100 ug via INTRAVENOUS

## 2015-10-09 MED ORDER — SCOPOLAMINE 1 MG/3DAYS TD PT72: 1.0000 | MEDICATED_PATCH | Freq: Once | TRANSDERMAL | Status: DC | PRN

## 2015-10-09 MED ORDER — CEFAZOLIN SODIUM-DEXTROSE 2-4 GM/100ML-% IV SOLN
INTRAVENOUS | Status: AC
  Filled 2015-10-09: qty 100

## 2015-10-09 MED ORDER — FENTANYL CITRATE (PF) 100 MCG/2ML IJ SOLN: 50.0000 ug | INTRAMUSCULAR | Status: DC | PRN

## 2015-10-09 MED ORDER — LACTATED RINGERS IV SOLN
INTRAVENOUS | Status: DC
  Administered 2015-10-09 (×2): via INTRAVENOUS

## 2015-10-09 SURGICAL SUPPLY — 36 items
BLADE SURG 15 STRL LF DISP TIS (BLADE) ×1 IMPLANT
BLADE SURG 15 STRL SS (BLADE) ×1
BNDG COHESIVE 3X5 TAN STRL LF (GAUZE/BANDAGES/DRESSINGS) ×2 IMPLANT
BNDG ESMARK 4X9 LF (GAUZE/BANDAGES/DRESSINGS) IMPLANT
BNDG GAUZE ELAST 4 BULKY (GAUZE/BANDAGES/DRESSINGS) ×2 IMPLANT
CHLORAPREP W/TINT 26ML (MISCELLANEOUS) ×2 IMPLANT
CORDS BIPOLAR (ELECTRODE) ×2 IMPLANT
COVER BACK TABLE 60X90IN (DRAPES) ×2 IMPLANT
COVER MAYO STAND STRL (DRAPES) ×2 IMPLANT
CUFF TOURNIQUET SINGLE 18IN (TOURNIQUET CUFF) ×2 IMPLANT
DRAPE EXTREMITY T 121X128X90 (DRAPE) ×2 IMPLANT
DRAPE SURG 17X23 STRL (DRAPES) ×2 IMPLANT
DRSG PAD ABDOMINAL 8X10 ST (GAUZE/BANDAGES/DRESSINGS) ×2 IMPLANT
GAUZE SPONGE 4X4 12PLY STRL (GAUZE/BANDAGES/DRESSINGS) ×2 IMPLANT
GAUZE XEROFORM 1X8 LF (GAUZE/BANDAGES/DRESSINGS) ×2 IMPLANT
GLOVE BIOGEL PI IND STRL 7.0 (GLOVE) ×1 IMPLANT
GLOVE BIOGEL PI IND STRL 7.5 (GLOVE) ×2 IMPLANT
GLOVE BIOGEL PI IND STRL 8.5 (GLOVE) ×1 IMPLANT
GLOVE BIOGEL PI INDICATOR 7.0 (GLOVE) ×1
GLOVE BIOGEL PI INDICATOR 7.5 (GLOVE) ×2
GLOVE BIOGEL PI INDICATOR 8.5 (GLOVE) ×1
GLOVE SURG ORTHO 8.0 STRL STRW (GLOVE) ×2 IMPLANT
GLOVE SURG SS PI 7.0 STRL IVOR (GLOVE) ×4 IMPLANT
GOWN STRL REUS W/ TWL LRG LVL3 (GOWN DISPOSABLE) ×2 IMPLANT
GOWN STRL REUS W/TWL LRG LVL3 (GOWN DISPOSABLE) ×2
GOWN STRL REUS W/TWL XL LVL3 (GOWN DISPOSABLE) ×2 IMPLANT
NEEDLE PRECISIONGLIDE 27X1.5 (NEEDLE) ×2 IMPLANT
NS IRRIG 1000ML POUR BTL (IV SOLUTION) ×2 IMPLANT
PACK BASIN DAY SURGERY FS (CUSTOM PROCEDURE TRAY) ×2 IMPLANT
STOCKINETTE 4X48 STRL (DRAPES) ×2 IMPLANT
SUT ETHILON 4 0 PS 2 18 (SUTURE) ×2 IMPLANT
SUT VICRYL 4-0 PS2 18IN ABS (SUTURE) IMPLANT
SYR BULB 3OZ (MISCELLANEOUS) ×2 IMPLANT
SYR CONTROL 10ML LL (SYRINGE) ×2 IMPLANT
TOWEL OR 17X24 6PK STRL BLUE (TOWEL DISPOSABLE) ×2 IMPLANT
UNDERPAD 30X30 (UNDERPADS AND DIAPERS) ×2 IMPLANT

## 2015-10-09 NOTE — Anesthesia Postprocedure Evaluation (Signed)
Anesthesia Post Note  Patient: Lindsey EchevariaJanie Copeland  Procedure(s) Performed: Procedure(s) (LRB): LEFT CARPAL TUNNEL RELEASE (Left)  Patient location during evaluation: PACU Anesthesia Type: General Level of consciousness: awake and alert Pain management: pain level controlled Vital Signs Assessment: post-procedure vital signs reviewed and stable Respiratory status: spontaneous breathing, nonlabored ventilation and respiratory function stable Cardiovascular status: blood pressure returned to baseline and stable Postop Assessment: no signs of nausea or vomiting Anesthetic complications: no    Last Vitals:  Filed Vitals:   10/09/15 0945 10/09/15 1004  BP: 109/70 133/75  Pulse: 47 58  Temp:  36.6 C  Resp: 21 18    Last Pain:  Filed Vitals:   10/09/15 1005  PainSc: 0-No pain                 Teodoro Jeffreys A

## 2015-10-09 NOTE — Discharge Instructions (Addendum)

## 2015-10-09 NOTE — Transfer of Care (Signed)
Immediate Anesthesia Transfer of Care Note  Patient: Lindsey Copeland  Procedure(s) Performed: Procedure(s): LEFT CARPAL TUNNEL RELEASE (Left)  Patient Location: PACU  Anesthesia Type:Bier block  Level of Consciousness: awake and patient cooperative  Airway & Oxygen Therapy: Patient Spontanous Breathing and Patient connected to face mask oxygen  Post-op Assessment: Report given to RN and Post -op Vital signs reviewed and stable  Post vital signs: Reviewed and stable  Last Vitals:  Filed Vitals:   10/09/15 0722 10/09/15 0921  BP: 110/87 119/74  Pulse: 68 66  Temp: 36.8 C   Resp: 18     Complications: No apparent anesthesia complications

## 2015-10-09 NOTE — Op Note (Signed)
Dictation Number [id number]

## 2015-10-09 NOTE — Brief Op Note (Signed)
10/09/2015  9:24 AM  PATIENT:  Jodie EchevariaJanie Nahm  55 y.o. female  PRE-OPERATIVE DIAGNOSIS:  LEFT CARPAL TUNNEL SYNDROME   POST-OPERATIVE DIAGNOSIS:  LEFT CARPAL TUNNEL SYNDROME   PROCEDURE:  Procedure(s): LEFT CARPAL TUNNEL RELEASE (Left)  SURGEON:  Surgeon(s) and Role:    * Cindee SaltGary Marisue Canion, MD - Primary  PHYSICIAN ASSISTANT:   ASSISTANTS: none   ANESTHESIA:   local and regional  EBL:  Total I/O In: 1000 [I.V.:1000] Out: -   BLOOD ADMINISTERED:none  DRAINS: none   LOCAL MEDICATIONS USED:  BUPIVICAINE   SPECIMEN:  No Specimen  DISPOSITION OF SPECIMEN:  N/A  COUNTS:  YES  TOURNIQUET:   Total Tourniquet Time Documented: Forearm (Left) - 20 minutes Total: Forearm (Left) - 20 minutes   DICTATION: .Other Dictation: Dictation Number [id number]  PLAN OF CARE: Discharge to home after PACU  PATIENT DISPOSITION:  PACU - hemodynamically stable.

## 2015-10-09 NOTE — H&P (Signed)
Lindsey Copeland is an 55 y.o. female.   Chief Complaint: numbness left hand HPI: Lindsey Copeland is a [age] you female with bilateral carpal tunnel syndrome and has had her right carpal tunnel released. She is right-handed. She has continued to complain of pain, numbness, and tingling on her left side. She was last seen in approximately November of 2016 following her right carpal tunnel release. She states that her left side continues to bother her. Nerve conductions were positive, done by Dr. Johna RolesPelligra, with a motor delay of 4.9, a sensory delay of 2.9, and amplitude diminution to 7.3 on her left side. She states that all of her fingers are now numb. She is awakened at night with this. Nothing seems to improve this. She was hoping that it would get better, but it has progressively gotten worse. She has no new injuries. She is not taking anything at the present time.  Past Medical History  Diagnosis Date  . COPD (chronic obstructive pulmonary disease) (HCC)  . Diabetes mellitus type II, controlled (HCC)  . Heart disease   Past Surgical History  Procedure Laterality Date  . Knee surgery  . Hand surgery    Past Medical History  Diagnosis Date  . Carpal tunnel syndrome   . Hyperlipidemia   . Graves disease   . Hyperthyroidism   . Vitamin D deficiency   . Type 2 diabetes mellitus (HCC)   . Asthma   . Dysrhythmia     palpitations  . COPD (chronic obstructive pulmonary disease) (HCC)   . Sarcoidosis (HCC)   . Sleep apnea     CPAP  . GERD (gastroesophageal reflux disease)     Past Surgical History  Procedure Laterality Date  . Ulnar nerve repair    . Right knee arthroscopy    . Radioactive iodine treatment    . Tubal ligation    . Video bronchoscopy  07/30/2012    Procedure: VIDEO BRONCHOSCOPY WITH FLUORO;  Surgeon: Storm FriskPatrick E Wright, MD;  [facility];  Service: Cardiopulmonary;  Laterality: N/A;  . Carpal tunnel release Right 02/20/2015    Procedure: RIGHT CARPAL TUNNEL  RELEASE;  Surgeon: Cindee SaltGary Gage Weant, MD;  Location: [facility];  Service: Orthopedics;  Laterality: Right;  ANESTHESIA:  IV REGIONAL FAB    Family History  Problem Relation Age of Onset  . Asthma Brother   . Diabetes Mother   . Heart failure Mother   . Glaucoma Mother   . Diabetes Sister   . Heart attack Brother    Social History:  reports that she quit smoking about 13 years ago. Her smoking use included Cigarettes. She has a 1 pack-year smoking history. She does not have any smokeless tobacco history on file. She reports that she drinks alcohol. She reports that she does not use illicit drugs.  Allergies:  Allergies  Allergen Reactions  . Sulfa Antibiotics     hives  . Robaxin [Methocarbamol] Palpitations    No prescriptions prior to admission    No results found for this or any previous visit (from the past 48 hour(s)).  No results found.   Pertinent items are noted in HPI.  Height 5\' 2"  (1.575 m), weight 89.359 kg (197 lb).  General appearance: alert, cooperative and appears stated age Head: Normocephalic, without obvious abnormality Neck: no JVD Resp: clear to auscultation bilaterally Cardio: regular rate and rhythm, S1, S2 normal, no murmur, click, rub or gallop GI: soft, non-tender; bowel sounds normal; no masses,  no organomegaly Extremities:  numbness left hand Pulses: 2+ and symmetric Skin: Skin color, texture, turgor normal. No rashes or lesions Neurologic: Grossly normal Incision/Wound: na  Assessment/Plan  Assessment:  1. Carpal tunnel syndrome of left wrist    Plan: I would recommend release of the left carpal canal. She would like to have this done. Pre and postoperative course has been discussed along with risks and complications. She does have diabetes, cervical spondylosis. She is scheduled for left carpal tunnel release. She is aware that there is no guarantee with the surgery, possibility of infection, recurrence, injury to arteries,  nerves, tendons, incomplete relief of dystrophy. I will see her back following her carpal tunnel release, left hand. This will be scheduled at her convenience. Questions are encouraged and answered to her reasonable satisfaction.   [name] R 10/09/2015, 6:05 AM

## 2015-10-09 NOTE — Anesthesia Procedure Notes (Signed)
Procedure Name: MAC Date/Time: 10/09/2015 8:48 AM Performed by: [place] DesanctisLINKA, Lindsey Copeland Pre-anesthesia Checklist: Patient identified, Timeout performed, Emergency Drugs available and Suction available Patient Re-evaluated:Patient Re-evaluated prior to inductionOxygen Delivery Method: Simple face mask

## 2015-10-09 NOTE — Anesthesia Preprocedure Evaluation (Signed)
Anesthesia Evaluation  Patient identified by MRN, date of birth, ID band Patient awake    Reviewed: Allergy & Precautions, NPO status , Patient's Chart, lab work & pertinent test results  Airway Mallampati: III  TM Distance: >3 FB Neck ROM: Full    Dental  (+) Teeth Intact, Dental Advisory Given   Pulmonary asthma , sleep apnea and Continuous Positive Airway Pressure Ventilation , COPD,  COPD inhaler, former smoker,    breath sounds clear to auscultation       Cardiovascular  Rhythm:Regular Rate:Normal     Neuro/Psych    GI/Hepatic GERD  Medicated and Controlled,  Endo/Other  diabetes, Well Controlled, Type 2, Oral Hypoglycemic AgentsHyperthyroidism Morbid obesity  Renal/GU      Musculoskeletal   Abdominal   Peds  Hematology   Anesthesia Other Findings   Reproductive/Obstetrics                             Anesthesia Physical Anesthesia Plan  ASA: III  Anesthesia Plan: MAC and Bier Block   Post-op Pain Management:    Induction: Intravenous  Airway Management Planned: Simple Face Mask  Additional Equipment:   Intra-op Plan:   Post-operative Plan:   Informed Consent: I have reviewed the patients History and Physical, chart, labs and discussed the procedure including the risks, benefits and alternatives for the proposed anesthesia with the patient or authorized representative who has indicated his/her understanding and acceptance.   Dental advisory given  Plan Discussed with: CRNA, Anesthesiologist and Surgeon  Anesthesia Plan Comments:         Anesthesia Quick Evaluation

## 2015-10-10 ENCOUNTER — Encounter (HOSPITAL_BASED_OUTPATIENT_CLINIC_OR_DEPARTMENT_OTHER): Payer: Self-pay | Admitting: Orthopedic Surgery

## 2015-10-10 NOTE — Op Note (Signed)
NAME:  [name], Lindsey              ACCOUNT NO.:  [id number]  MEDICAL RECORD NO.:  [id number]  LOCATION:                               FACILITY:  [facility]  PHYSICIAN:  Cindee SaltGary Kadrian Partch, M.D.       DATE OF BIRTH:  [birth year]  DATE OF PROCEDURE:  10/09/2015 DATE OF DISCHARGE:  10/09/2015                              OPERATIVE REPORT   PREOPERATIVE DIAGNOSIS:  Carpal tunnel syndrome, left hand.  POSTOPERATIVE DIAGNOSIS:  Carpal tunnel syndrome, left hand.  OPERATION:  Decompression of left median nerve.  SURGEON:  Cindee SaltGary Remingtyn Depaola, M.D.  ANESTHESIA:  Forearm-based IV regional with local infiltration.  ANESTHESIOLOGIST:  Sheldon Silvanavid Crews, M.D.  [facility]  HISTORY:  The patient is a 55 year old female with a history of bilateral carpal tunnel syndrome.  She has release on her right side. She is admitted now for release to the left.  Pre, peri, and postoperative course have been discussed along with risks and complications.  She is aware that there is no guarantee with the surgery; possibility of infection; recurrence of injury to arteries, nerves, tendons; incomplete relief of symptoms and dystrophy.  In the preoperative area, the patient is seen, the extremity marked by both patient and surgeon, and antibiotic given.  PROCEDURE IN DETAIL:  The patient was brought to the operating room where a forearm-based IV regional anesthetic was carried out without difficulty.  She was prepped using ChloraPrep, supine position, left arm free.  A 3-minute dry time was allowed, time-out taken, confirming the patient and procedure.  A longitudinal incision was made in the left palm, carried down through the subcutaneous tissue.  Bleeders were electrocauterized.  Palmar fascia was split.  Superficial palmar arch was identified.  The flexor tendon to the ring and little finger identified to the ulnar side of the median nerve.  The carpal retinaculum was incised with sharp  dissection.  A right-angle and Sewall retractor were placed between the skin and forearm fascia.  The fascia was released for approximately 2 cm proximal to the wrist crease under direct vision.  Canal was explored.  Tenosynovial tissue was markedly thickened.  Area compression to the nerve was apparent with an hourglass deformity.  Motor branch was noted to enter into muscle distally.  The wound was copiously irrigated with saline.  The skin was then closed with interrupted 4-0 nylon sutures.  A local infiltration with 0.25% bupivacaine without epinephrine was given, approximately 8 mL was used.  A sterile compressive dressing with the fingers free was applied. On deflation of the tourniquet, all fingers were immediately pinked, she was taken to the recovery room for observation in satisfactory condition.  She will be discharged to home [facility] [place] on [place].          ______________________________ Cindee SaltGary Almetta Liddicoat, M.D.     GK/MEDQ  D:  10/09/2015  T:  10/09/2015  Job:  [id number]

## 2015-10-19 ENCOUNTER — Encounter: Payer: Self-pay | Admitting: Cardiology

## 2015-10-19 ENCOUNTER — Ambulatory Visit (INDEPENDENT_AMBULATORY_CARE_PROVIDER_SITE_OTHER): Admitting: Cardiology

## 2015-10-19 DIAGNOSIS — R0789 Other chest pain: Secondary | ICD-10-CM | POA: Diagnosis not present

## 2015-10-19 DIAGNOSIS — R079 Chest pain, unspecified: Secondary | ICD-10-CM

## 2015-10-19 NOTE — Patient Instructions (Signed)
Medication Instructions:  Your physician recommends that you continue on your current medications as directed. Please refer to the Current Medication list given to you today.   Labwork: None  Testing/Procedures: None  Follow-Up: Your physician recommends that you schedule a follow-up appointment AS NEEDED with Dr. Turner.  Any Other Special Instructions Will Be Listed Below (If Applicable).     If you need a refill on your cardiac medications before your next appointment, please call your pharmacy.   

## 2015-10-19 NOTE — Progress Notes (Signed)
Cardiology Office Note    Date:  10/19/2015   ID:  Lindsey Copeland, DOB August 20, 1960, MRN [id number]  PCP:  Mickie Hillier, MD  Cardiologist:  Quintella Reichert, MD   Chief Complaint  Patient presents with  . Sleep Apnea    History of Present Illness:  Lindsey Copeland is a 55 y.o. female with a history of chest pain and SOB and multiple CRF for CAD including DM, dylipidemia and remote tobacco use. The patient also has a history of GERD. She has a history of obstructive sleep apnea and COPD and uses a CPAP machine. Dr. Vassie Loll is her pulmonologist. She has a history of GERD She recently saw Dr. Patty Sermons for evaluation.  Nuclear stress test showed no ischemia.  Since the stress test she has had a significant decline in her chest pain.  [facility] and she has quit her job.  She has chronic DOE from COPD and sarcoid which is under control with inhalers.   Past Medical History  Diagnosis Date  . Carpal tunnel syndrome   . Hyperlipidemia   . Graves disease   . Hyperthyroidism   . Vitamin D deficiency   . Type 2 diabetes mellitus (HCC)   . Asthma   . Dysrhythmia     palpitations  . COPD (chronic obstructive pulmonary disease) (HCC)   . Sarcoidosis (HCC)   . Sleep apnea     CPAP  . GERD (gastroesophageal reflux disease)     Past Surgical History  Procedure Laterality Date  . Ulnar nerve repair    . Right knee arthroscopy    . Radioactive iodine treatment    . Tubal ligation    . Video bronchoscopy  07/30/2012    Procedure: VIDEO BRONCHOSCOPY WITH FLUORO;  Surgeon: Storm Frisk, MD;  Location: [facility];  Service: Cardiopulmonary;  Laterality: N/A;  . Carpal tunnel release Right 02/20/2015    Procedure: RIGHT CARPAL TUNNEL RELEASE;  Surgeon: Cindee Salt, MD;  Location: [facility];  Service: Orthopedics;  Laterality: Right;  ANESTHESIA:  IV REGIONAL FAB  . Carpal tunnel release Left 10/09/2015     Procedure: LEFT CARPAL TUNNEL RELEASE;  Surgeon: Cindee Salt, MD;  Location: [facility];  Service: Orthopedics;  Laterality: Left;    Current Medications: Outpatient Prescriptions Prior to Visit  Medication Sig Dispense Refill  . acetaminophen (TYLENOL) 500 MG tablet Take 500 mg by mouth every 6 (six) hours as needed for mild pain or headache.     . albuterol (PROVENTIL HFA;VENTOLIN HFA) 108 (90 Base) MCG/ACT inhaler Inhale 2 puffs into the lungs every 6 (six) hours as needed for wheezing or shortness of breath.    Marland Kitchen aspirin 81 MG tablet Take 81 mg by mouth daily.    Marland Kitchen atorvastatin (LIPITOR) 80 MG tablet Take 80 mg by mouth daily.    . budesonide-formoterol (SYMBICORT) 160-4.5 MCG/ACT inhaler INHALE TWO PUFFS INTO LUNGS TWICE DAILY 1 Inhaler 11  . Cholecalciferol (VITAMIN D-3) 1000 UNITS CAPS Take 1 capsule by mouth daily.     . fluticasone (FLONASE) 50 MCG/ACT nasal spray Place 2 sprays into both nostrils daily as needed for allergies or rhinitis.    Marland Kitchen HYDROcodone-acetaminophen (NORCO) 5-325 MG tablet Take 1 tablet by mouth every 6 (six) hours as needed for moderate pain. 30 tablet 0  . ibuprofen (ADVIL,MOTRIN) 200 MG tablet Take 200 mg by mouth every 6 (six) hours as needed for  mild pain.     . metFORMIN (GLUCOPHAGE) 500 MG tablet Take 1,000 mg by mouth every evening.     . Multiple Vitamin (MULTIVITAMIN) tablet Take 1 tablet by mouth daily.    Marland Kitchen omeprazole (PRILOSEC) 20 MG capsule Take 20 mg by mouth daily as needed (indigestion).      No facility-administered medications prior to visit.     Allergies:   Sulfa antibiotics and Robaxin   Social History   Social History  . Marital Status: Married    Spouse Name: N/A  . Number of Children: 3  . 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 Smoking status: Former Smoker -- 0.10 packs/day for 10 years    Types: Cigarettes    Quit date: 06/30/2002  . Smokeless  tobacco: Not on file  . Alcohol Use: Yes     Comment: occasionally  . Drug Use: No  . Sexual Activity: Not on file   Other Topics Concern  . Not on file   Social History Narrative     Family History:  The patient's family history includes Asthma in her brother; Diabetes in her mother and sister; Glaucoma in her mother; Heart attack in her brother; Heart failure in her mother.   ROS:   Please see the history of present illness.    Review of Systems  Constitution: Negative.  HENT: Negative.   Eyes: Negative.   Cardiovascular: Negative.   Respiratory: Negative.   Skin: Negative.   Musculoskeletal: Positive for back pain.  Gastrointestinal: Negative.   Genitourinary: Negative.   Neurological: Negative.   Psychiatric/Behavioral: Negative.    All other systems reviewed and are negative.   PHYSICAL EXAM:   VS:  There were no vitals taken for this visit.   GEN: Well nourished, well developed, in no acute distress HEENT: normal Neck: no JVD, carotid bruits, or masses Cardiac: RRR; no murmurs, rubs, or gallops,no edema.  Intact distal pulses bilaterally.  Respiratory:  clear to auscultation bilaterally, normal work of breathing GI: soft, nontender, nondistended, + BS MS: no deformity or atrophy Skin: warm and dry, no rash Neuro:  Alert and Oriented x 3, Strength and sensation are intact Psych: euthymic mood, full affect  Wt Readings from Last 3 Encounters:  10/09/15 198 lb 2 oz (89.869 kg)  09/20/15 197 lb (89.359 kg)  08/29/15 197 lb 12.8 oz (89.721 kg)      Studies/Labs Reviewed:   EKG:  EKG is not ordered today.    Recent Labs: 10/09/2015: BUN 17; Creatinine, Ser 0.60; Hemoglobin 13.6; Potassium 3.7; Sodium 141   Lipid Panel No results found for: CHOL, TRIG, HDL, CHOLHDL, VLDL, LDLCALC, LDLDIRECT  Additional studies/ records that were reviewed today include:  none    ASSESSMENT:    1. OSA (obstructive sleep apnea)   2. Chest pain of uncertain etiology       PLAN:  In order of problems listed above:  1. Chest pain with no ischemia on nuclear stress test. He CP has essentially resolved after quitting her job and taking a course in stress management from the [place].  I have asked her to let me know if her CP returns.  She will followup with me on a PRN basis.     Medication Adjustments/Labs and Tests Ordered: Current medicines are reviewed at length with the patient today.  Concerns regarding medicines are outlined above.  Medication changes, Labs and Tests ordered today  are listed in the Patient Instructions below. There are no Patient Instructions on file for this visit.   Harlon FlorSigned, Shabana Armentrout R, MD  10/19/2015 8:22 AM    [facility] [address], [place], [place]  [address] Phone: [phone]; Fax: [phone]

## 2016-01-18 ENCOUNTER — Ambulatory Visit: Admitting: Adult Health

## 2016-01-24 ENCOUNTER — Ambulatory Visit (INDEPENDENT_AMBULATORY_CARE_PROVIDER_SITE_OTHER): Admitting: Adult Health

## 2016-01-24 ENCOUNTER — Encounter: Payer: Self-pay | Admitting: Adult Health

## 2016-01-24 DIAGNOSIS — D862 Sarcoidosis of lung with sarcoidosis of lymph nodes: Secondary | ICD-10-CM | POA: Diagnosis not present

## 2016-01-24 DIAGNOSIS — G4733 Obstructive sleep apnea (adult) (pediatric): Secondary | ICD-10-CM | POA: Diagnosis not present

## 2016-01-24 NOTE — Assessment & Plan Note (Signed)
Doing well on CPAP   Plan  Continue with CPAP At bedtime   Follow up Dr. Vassie Loll  In 6 months  and and As needed   Please contact office for sooner follow up if symptoms do not improve or worsen or seek emergency care

## 2016-01-24 NOTE — Progress Notes (Signed)
Subjective:    Patient ID: Lindsey Copeland, female    DOB: Aug 08, 1960, 55 y.o.   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  She had asthma as a child, outgrew it and then symptoms returned after a chest cold in winter of 2013. She has Graves' disease status post radioactive iodine.    Significant tests/ events 01/2013 PSG -wt 190 pounds, BMI 34 showed predominant hypopneas during supine sleep and with AHI of 16 events per hour. Nonsupine AHI was only 3 events per hour.  01/2013 >> started on auto CPAP with a medium fullface mask in, had initial difficulty but has now adjusted  Download 05/28/13 - avg use 4h, avg pr 14 cm, no residuals or leak 05/2013 download on auto 5-15 -avg pr 15   PFTs 07/2012 nml lung volumes, no obs, DLCO 71% CT chest 05/2013 Mediastinal/bilateral hilar lymphadenopathy, Peribronchovascular nodularity throughout all lobes, numerous  lesions in the spleen. CXR 04/2014 decreased LNs   01/24/2016 Follow up : Sarcoid and Mod OSA  Presents for a  6 month follow up . Still having a lot of nasal drainage on/off.  Doing good on Symbicort .  No flare of cough or wheeizng .  Last cxr 05/2015 w/ stable changes.   Has moderate OSA  On CPAP At bedtime   Doing okay on CPAP. Denies sign daytime sleepiness.  Download shows good compliance with avg usage at 5hr . AHI 1.1 . Min leaks.    Review of Systems  Constitutional:   No  weight loss, night sweats,  Fevers, chills,  +fatigue, or  lassitude.  HEENT:   No headaches,  Difficulty swallowing,  Tooth/dental problems, or  Sore throat,                No sneezing, itching, ear ache,  +nasal congestion, post nasal drip,   CV:  No chest pain,  Orthopnea, PND, swelling in lower extremities, anasarca, dizziness, palpitations, syncope.   GI  No heartburn, indigestion, abdominal pain, nausea, vomiting, diarrhea, change  in bowel habits, loss of appetite, bloody stools.   Resp: .  No chest wall deformity  Skin: no rash or lesions.  GU: no dysuria, change in color of urine, no urgency or frequency.  No flank pain, no hematuria   MS:  No joint pain or swelling.  No decreased range of motion.  No back pain.  Psych:  No change in mood or affect. No depression or anxiety.  No memory loss.          Objective:   Physical Exam Vitals:   01/24/16 1100  BP: 112/66  Pulse: 75  SpO2: 100%  Weight: 200 lb (90.7 kg)  Height: 5\' 2"  (1.575 m)    GEN: A/Ox3; pleasant , NAD, obese    HEENT:  [place]/AT,  EACs-clear, TMs-wnl, NOSE-clear, THROAT-clear, no lesions, no postnasal drip or exudate noted.   NECK:  Supple w/ fair ROM; no JVD; normal carotid impulses w/o bruits; no thyromegaly or nodules palpated; no lymphadenopathy.    RESP  CTA w/ no wheezing , no accessory muscle use, no dullness to percussion  CARD:  RRR, no m/r/g  , no peripheral edema, pulses intact, no cyanosis or clubbing.  GI:   Soft & nt; nml bowel sounds; no organomegaly or masses detected.   Musco: Warm bil, no deformities or joint swelling noted.   Neuro: alert,  no focal deficits noted.    Skin: Warm, no lesions or rashes  Derald Lorge NP-C  [facility]  01/24/2016

## 2016-01-24 NOTE — Patient Instructions (Signed)
Saline nasal rinses As needed   May use Nasacort 2 puffs daily As needed   May use Zyrtec 10mg  daily ,As needed  Drainage  Continue on Symbicort, Rinse after use .  Continue with CPAP At bedtime   Follow up Dr. Vassie Loll  In 6 months  and and As needed   Please contact office for sooner follow up if symptoms do not improve or worsen or seek emergency care

## 2016-01-24 NOTE — Assessment & Plan Note (Signed)
Doing well , mild rhinitis flare  Saline nasal rinses As needed   May use Nasacort 2 puffs daily As needed   May use Zyrtec 10mg  daily ,As needed  Drainage  Continue on Symbicort, Rinse after use .  Follow up Dr. Vassie Loll  In 6 months  and and As needed   Please contact office for sooner follow up if symptoms do not improve or worsen or seek emergency care

## 2016-02-11 ENCOUNTER — Encounter: Payer: Self-pay | Admitting: Adult Health

## 2016-03-27 ENCOUNTER — Ambulatory Visit (INDEPENDENT_AMBULATORY_CARE_PROVIDER_SITE_OTHER): Admitting: Emergency Medicine

## 2016-03-27 ENCOUNTER — Encounter: Payer: Self-pay | Admitting: Emergency Medicine

## 2016-03-27 DIAGNOSIS — J209 Acute bronchitis, unspecified: Secondary | ICD-10-CM

## 2016-03-27 DIAGNOSIS — D862 Sarcoidosis of lung with sarcoidosis of lymph nodes: Secondary | ICD-10-CM

## 2016-03-27 DIAGNOSIS — G4733 Obstructive sleep apnea (adult) (pediatric): Secondary | ICD-10-CM | POA: Diagnosis not present

## 2016-03-27 MED ORDER — AZITHROMYCIN 250 MG PO TABS: ORAL_TABLET | ORAL | 0 refills | Status: AC

## 2016-03-27 NOTE — Assessment & Plan Note (Signed)
No wheezing on exam. The rash that she describes on her right breast does not sound like sarcoidosis. I think she should be treated for simple bronchitis steroids. Depending on resolution of the symptoms she may merit repeat CT scan for evaluation for progression of her sarcoidosis, will defer for now.

## 2016-03-27 NOTE — Assessment & Plan Note (Signed)
Treat w azithro and follow for resolution

## 2016-03-27 NOTE — Progress Notes (Signed)
Subjective:    Patient ID: Lindsey Copeland, female    DOB: 09-21-1960, 55 y.o.   MRN: [id number]  HPI This is an acute visit for 55 year old woman, remote tobacco exposure, with a history of sarcoidosis and moderate obstructive sleep apnea on CPAP. She is followed by Dr Vassie Loll. She has been managed on Symbicort. She reports that she began to have some cough productive of yellow mucous since 1 weeks ago, more nasal congestion for 2 weeks. She has evolved more exertional SOB as well. She reports a singular rounded crusted pruritic skin lesion on R breast. She has been reliable w symbicort, has not used ventolin with these sx. No wheezing. No clear sick contacts, although she was on a bus trip recently.                                                                                                                           Review of Systems As per HPI  Past Medical History:  Diagnosis Date  . Asthma   . Carpal tunnel syndrome   . COPD (chronic obstructive pulmonary disease) (HCC)   . Dysrhythmia    palpitations  . GERD (gastroesophageal reflux disease)   . Graves disease   . Hyperlipidemia   . Hyperthyroidism   . Sarcoidosis (HCC)   . Sleep apnea    CPAP  . Type 2 diabetes mellitus (HCC)   . Vitamin D deficiency      Family History  Problem Relation Age of Onset  . Asthma Brother   . Diabetes Mother   . Heart failure Mother   . Glaucoma Mother   . Diabetes Sister   . Heart attack Brother      Social History   Social History  . Marital status: Married    Spouse name: N/A  . Number of children: 3  . 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 Smoking status: Former Smoker    Packs/day: 0.10    Years: 10.00    Types: Cigarettes    Quit date: 06/30/2002  . Smokeless tobacco: Not on file  . Alcohol use Yes     Comment: occasionally  . Drug use: No  . Sexual activity: Not on file   Other Topics Concern  . Not on  file   Social History Narrative  . No narrative on file     Allergies  Allergen Reactions  . Sulfa Antibiotics     hives  . Robaxin [Methocarbamol] Palpitations     Outpatient Medications Prior to Visit  Medication Sig Dispense Refill  . acetaminophen (TYLENOL) 500 MG tablet Take 500 mg by mouth every 6 (six) hours as needed for mild pain or headache.     . albuterol (PROVENTIL HFA;VENTOLIN HFA) 108 (90 Base) MCG/ACT inhaler Inhale 2 puffs into the lungs every 6 (six) hours as needed for wheezing or shortness of breath.    Marland Kitchen  aspirin 81 MG tablet Take 81 mg by mouth daily.    Marland Kitchen. atorvastatin (LIPITOR) 80 MG tablet Take 80 mg by mouth daily.    . budesonide-formoterol (SYMBICORT) 160-4.5 MCG/ACT inhaler INHALE TWO PUFFS INTO LUNGS TWICE DAILY 1 Inhaler 11  . Cholecalciferol (VITAMIN D-3) 1000 UNITS CAPS Take 1 capsule by mouth daily.     . fluticasone (FLONASE) 50 MCG/ACT nasal spray Place 2 sprays into both nostrils daily as needed for allergies or rhinitis.    Marland Kitchen. ibuprofen (ADVIL,MOTRIN) 200 MG tablet Take 200 mg by mouth every 6 (six) hours as needed for mild pain.     . metFORMIN (GLUCOPHAGE) 500 MG tablet Take 1,000 mg by mouth every evening.     . Multiple Vitamin (MULTIVITAMIN) tablet Take 1 tablet by mouth daily.    Marland Kitchen. omeprazole (PRILOSEC) 20 MG capsule Take 20 mg by mouth daily as needed (indigestion).      No facility-administered medications prior to visit.         Objective:   Physical Exam Vitals:   03/27/16 1157 03/27/16 1158  BP:  110/70  Pulse:  89  SpO2:  97%  Weight: 198 lb (89.8 kg)   Height: 5' 2.5" (1.588 m)    Gen: Pleasant, well-nourished, in no distress,  normal affect  ENT: No lesions,  mouth clear,  oropharynx clear, no postnasal drip  Neck: No JVD, no TMG, no carotid bruits  Lungs: No use of accessory muscles, no wheeze, clear without rales or rhonchi  Cardiovascular: RRR, heart sounds normal, no murmur or gallops, no peripheral  edema  Musculoskeletal: No deformities, no cyanosis or clubbing  Neuro: alert, non focal      Assessment & Plan:  OSA (obstructive sleep apnea) Tolerating CPAP and benefiting from it. Her compliance is suffered some with her recent illness.  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 The rash that she describes on her right breast does not sound like sarcoidosis. I think she should be treated for simple bronchitis steroids. Depending on resolution of the symptoms she may merit repeat CT scan for evaluation for progression of her sarcoidosis, will defer for now.   Acute bronchitis Treat w azithro and follow for resolution   Levy Pupaobert Salam Chesterfield, MD, PhD 03/27/2016, 12:15 PM [facility] [phone] or if no answer [phone]

## 2016-03-27 NOTE — Addendum Note (Signed)
Addended by: Jaynee EaglesLEMONS, LINDSAY C on: 03/27/2016 12:18 PM   Modules accepted: Orders

## 2016-03-27 NOTE — Patient Instructions (Signed)
Please continue Symbicort twice a day Please use Ventolin 2 puffs if needed for shortness of breath Please take azithromycin as directed until completely gone Continue your CPAP every night Follow with Dr Vassie LollAlva or NP in 3 weeks to insure that you are improved.

## 2016-03-27 NOTE — Assessment & Plan Note (Signed)
Tolerating CPAP and benefiting from it. Her compliance is suffered some with her recent illness.

## 2016-04-18 ENCOUNTER — Encounter: Payer: Self-pay | Admitting: Adult Health

## 2016-04-18 ENCOUNTER — Ambulatory Visit (INDEPENDENT_AMBULATORY_CARE_PROVIDER_SITE_OTHER): Admitting: Adult Health

## 2016-04-18 VITALS — BP 106/64 | HR 89 | Temp 98.4°F | Ht 62.5 in | Wt 199.2 lb

## 2016-04-18 DIAGNOSIS — F329 Major depressive disorder, single episode, unspecified: Secondary | ICD-10-CM

## 2016-04-18 DIAGNOSIS — G4733 Obstructive sleep apnea (adult) (pediatric): Secondary | ICD-10-CM

## 2016-04-18 DIAGNOSIS — Z23 Encounter for immunization: Secondary | ICD-10-CM

## 2016-04-18 DIAGNOSIS — J208 Acute bronchitis due to other specified organisms: Secondary | ICD-10-CM | POA: Diagnosis not present

## 2016-04-18 DIAGNOSIS — F320 Major depressive disorder, single episode, mild: Secondary | ICD-10-CM

## 2016-04-18 DIAGNOSIS — N649 Disorder of breast, unspecified: Secondary | ICD-10-CM | POA: Diagnosis not present

## 2016-04-18 DIAGNOSIS — F32A Depression, unspecified: Secondary | ICD-10-CM | POA: Insufficient documentation

## 2016-04-18 NOTE — Patient Instructions (Addendum)
Refer to Dermatology as soon as possible for breast lesion.  Get your mammogram .  Remain on CPAP At bedtime   Work on weight loss.  Begin Zyrtec 10mg  At bedtime  As needed  Drainage  Delsym 2 tsp Twice daily  As needed  Cough.  Saline nasal rinses As needed   Refer to psychologist for depression .  Follow up with Dr. Vassie LollAlva  In 6 months and As needed

## 2016-04-18 NOTE — Progress Notes (Signed)
Subjective:    Patient ID: Lindsey Copeland, female    DOB: 1960/08/26, 55 y.o.   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  She had asthma as a child, outgrew it and then symptoms returned after a chest cold in winter of 2013. She has Graves' disease status post radioactive iodine.    Significant tests/ events 01/2013 PSG -wt 190 pounds, BMI 34 showed predominant hypopneas during supine sleep and with AHI of 16 events per hour. Nonsupine AHI was only 3 events per hour.  01/2013 >> started on auto CPAP with a medium fullface mask in, had initial difficulty but has now adjusted  Download 05/28/13 - avg use 4h, avg pr 14 cm, no residuals or leak 05/2013 download on auto 5-15 -avg pr 15   PFTs 07/2012 nml lung volumes, no obs, DLCO 71% CT chest 05/2013 Mediastinal/bilateral hilar lymphadenopathy, Peribronchovascular nodularity throughout all lobes, numerous  lesions in the spleen. CXR 04/2014 decreased LNs   04/18/2016 Follow up : Sarcoid and Mod OSA  Presents for a  1 month follow up .  Seen last ov with bronchitis, tx w/ Zpack .  She is feeling better. Still has some dry cough and drainage.  Last cxr 05/2015 w/ stable changes. No wheezing.  She denies any fever, chest pain, orthopnea, PND, or increased leg swelling  Has moderate OSA  On CPAP At bedtime   Doing okay on CPAP. Denies sign daytime sleepiness.  Download shows good compliance with avg usage at 5hr . AHI 1.1 . Min leaks.   She is somewhat tearful . Says she is under a lot of stress with family issues (daughter sick, marital prob, and health issues. )  Feels depressed. Would like referral to psychologist.  Denies suidcidal ideations.   Also has lesion on breast for last 3 weeks, +pruritis. No drainage.  Mammogram >1 yr ago.   Had shingles last week, tx by PCP . Resolving on Valtrex.   Past Medical History:    Diagnosis Date  . Asthma   . Carpal tunnel syndrome   . COPD (chronic obstructive pulmonary disease) (HCC)   . Dysrhythmia    palpitations  . GERD (gastroesophageal reflux disease)   . Graves disease   . Hyperlipidemia   . Hyperthyroidism   . Sarcoidosis (HCC)   . Sleep apnea    CPAP  . Type 2 diabetes mellitus (HCC)   . Vitamin D deficiency    Current Outpatient Prescriptions on File Prior to Visit  Medication Sig Dispense Refill  . albuterol (PROVENTIL HFA;VENTOLIN HFA) 108 (90 Base) MCG/ACT inhaler Inhale 2 puffs into the lungs every 6 (six) hours as needed for wheezing or shortness of breath.    Marland Kitchen aspirin 81 MG tablet Take 81 mg by mouth daily.    Marland Kitchen atorvastatin (LIPITOR) 80 MG tablet Take 80 mg by mouth daily.    . budesonide-formoterol (SYMBICORT) 160-4.5 MCG/ACT inhaler INHALE TWO PUFFS INTO LUNGS TWICE DAILY 1 Inhaler 11  . Cholecalciferol (VITAMIN D-3) 1000 UNITS CAPS Take 1 capsule by mouth daily.     . fluticasone (FLONASE) 50 MCG/ACT nasal spray Place 2 sprays into both nostrils daily as needed for allergies or rhinitis.    . metFORMIN (GLUCOPHAGE) 500 MG tablet Take 1,000 mg by mouth every evening.     . Multiple Vitamin (MULTIVITAMIN) tablet Take 1 tablet by mouth daily.    Marland Kitchen  omeprazole (PRILOSEC) 20 MG capsule Take 20 mg by mouth daily as needed (indigestion).     Marland Kitchen. acetaminophen (TYLENOL) 500 MG tablet Take 500 mg by mouth every 6 (six) hours as needed for mild pain or headache.      No current facility-administered medications on file prior to visit.       Review of Systems  Constitutional:   No  weight loss, night sweats,  Fevers, chills,  +fatigue, or  lassitude.  HEENT:   No headaches,  Difficulty swallowing,  Tooth/dental problems, or  Sore throat,                No sneezing, itching, ear ache,  +nasal congestion, post nasal drip,   CV:  No chest pain,  Orthopnea, PND, swelling in lower extremities, anasarca, dizziness, palpitations, syncope.   GI   No heartburn, indigestion, abdominal pain, nausea, vomiting, diarrhea, change in bowel habits, loss of appetite, bloody stools.   Resp: .  No chest wall deformity  Skin: no rash or lesions.  GU: no dysuria, change in color of urine, no urgency or frequency.  No flank pain, no hematuria   MS:  No joint pain or swelling.  No decreased range of motion.  No back pain.  Psych:  No change in mood or affect.+ depression  No anxiety.  No memory loss.          Objective:   Physical Exam Vitals:   04/18/16 0912  BP: 106/64  Pulse: 89  Temp: 98.4 F (36.9 C)  TempSrc: Oral  SpO2: 98%  Weight: 199 lb 3.2 oz (90.4 kg)  Height: 5' 2.5" (1.588 m)  Body mass index is 35.85 kg/m.   GEN: A/Ox3; pleasant , NAD, obese    HEENT:  [place]/AT,  EACs-clear, TMs-wnl, NOSE-clear, THROAT-clear, no lesions, no postnasal drip or exudate noted. Class 2 MP airway   NECK:  Supple w/ fair ROM; no JVD; normal carotid impulses w/o bruits; no thyromegaly or nodules palpated; no lymphadenopathy.    RESP  CTA w/ no wheezing , no accessory muscle use, no dullness to percussion  Breast : very large pendulous breasts no palpable mass along lesions, hyperpigmentation along left breast   CARD:  RRR, no m/r/g  , no peripheral edema, pulses intact, no cyanosis or clubbing.  GI:   Soft & nt; nml bowel sounds; no organomegaly or masses detected.   Musco: Warm bil, no deformities or joint swelling noted.   Neuro: alert, no focal deficits noted.    Skin: Warm,  Along right breast scally lesion along areola, no palpable mass noted. Tender to touch   Tammy Parrett NP-C  [facility]  04/18/2016

## 2016-04-18 NOTE — Assessment & Plan Note (Signed)
Depression - refer to psychologist follow up with PCP to decide if meds are indicated

## 2016-04-18 NOTE — Assessment & Plan Note (Signed)
Recent bronchitis, now resolving. Patient does have some residual postnasal drip. She is add Zyrtec and saline nasal rinses. Patient's continue on her current regimen follow up in 6 months and As needed   Please contact office for sooner follow up if symptoms do not improve or worsen or seek emergency care ]

## 2016-04-18 NOTE — Assessment & Plan Note (Signed)
Skin lesion along right breast along areola ? Etiology  Refer to dermatology  Needs mammogram -pt to schedule ASAP

## 2016-04-18 NOTE — Assessment & Plan Note (Signed)
Well controlled on C Pap  Plan Continue on C Pap at bedtime. Next lab work on weight loss. Neck 5. Do not drive a sleepy

## 2016-04-24 ENCOUNTER — Encounter: Payer: Self-pay | Admitting: Adult Health

## 2016-04-24 NOTE — Progress Notes (Signed)
Reviewed & agree with plan  

## 2016-05-20 ENCOUNTER — Ambulatory Visit: Admitting: Psychology

## 2016-06-10 ENCOUNTER — Telehealth: Payer: Self-pay | Admitting: Pulmonary Disease

## 2016-06-10 DIAGNOSIS — G4733 Obstructive sleep apnea (adult) (pediatric): Secondary | ICD-10-CM

## 2016-06-11 NOTE — Telephone Encounter (Signed)
Patient is returning phone call.  °

## 2016-06-11 NOTE — Telephone Encounter (Signed)
Patient stated [employer] is the name [employer] the [employer].

## 2016-06-11 NOTE — Telephone Encounter (Signed)
Spoke with the pt  She states needing new CPAP supplies and wants to try a nasal mask  She currently uses [facility]  Since RB seen pt last, will forward to him to be sure okay for new type of mask since RA still not in the office  Please advise, thanks!

## 2016-06-11 NOTE — Telephone Encounter (Signed)
lmomtcb x 2  Need to know which home care company the pt uses to send the order in for her cpap supplies.

## 2016-06-11 NOTE — Telephone Encounter (Signed)
Order placed. Pt aware and voiced understanding. Nothing further needed.  

## 2016-06-11 NOTE — Telephone Encounter (Signed)
Lmtcb.

## 2016-06-11 NOTE — Telephone Encounter (Signed)
yes this is Ok thanks

## 2016-07-28 ENCOUNTER — Telehealth: Payer: Self-pay | Admitting: Pulmonary Disease

## 2016-07-28 MED ORDER — ALBUTEROL SULFATE HFA 108 (90 BASE) MCG/ACT IN AERS: 2.0000 | INHALATION_SPRAY | Freq: Four times a day (QID) | RESPIRATORY_TRACT | 3 refills | Status: DC | PRN

## 2016-07-28 MED ORDER — BUDESONIDE-FORMOTEROL FUMARATE 160-4.5 MCG/ACT IN AERO: INHALATION_SPRAY | RESPIRATORY_TRACT | 3 refills | Status: DC

## 2016-07-28 NOTE — Telephone Encounter (Signed)
SPoke with pt, she stated she needed S[facility]. I sent in refills for pt. Further workup and treatment could be done if symptoms persist, worsen or new related symptoms occur. The patient will call in that eventuality.

## 2016-09-04 ENCOUNTER — Ambulatory Visit (INDEPENDENT_AMBULATORY_CARE_PROVIDER_SITE_OTHER): Admitting: Licensed Clinical Social Worker

## 2016-09-04 DIAGNOSIS — F321 Major depressive disorder, single episode, moderate: Secondary | ICD-10-CM

## 2016-09-25 ENCOUNTER — Ambulatory Visit (INDEPENDENT_AMBULATORY_CARE_PROVIDER_SITE_OTHER): Admitting: Licensed Clinical Social Worker

## 2016-09-25 DIAGNOSIS — F321 Major depressive disorder, single episode, moderate: Secondary | ICD-10-CM

## 2016-10-15 ENCOUNTER — Other Ambulatory Visit: Payer: Self-pay | Admitting: Pulmonary Disease

## 2016-10-16 ENCOUNTER — Ambulatory Visit: Admitting: Licensed Clinical Social Worker

## 2016-10-26 ENCOUNTER — Encounter: Payer: Self-pay | Admitting: Pulmonary Disease

## 2016-10-28 ENCOUNTER — Ambulatory Visit (INDEPENDENT_AMBULATORY_CARE_PROVIDER_SITE_OTHER): Admitting: Licensed Clinical Social Worker

## 2016-10-28 DIAGNOSIS — F321 Major depressive disorder, single episode, moderate: Secondary | ICD-10-CM

## 2016-10-29 ENCOUNTER — Encounter: Payer: Self-pay | Admitting: Pulmonary Disease

## 2016-10-29 ENCOUNTER — Ambulatory Visit (INDEPENDENT_AMBULATORY_CARE_PROVIDER_SITE_OTHER): Admitting: Pulmonary Disease

## 2016-10-29 VITALS — BP 116/80 | HR 72 | Ht 62.5 in | Wt 176.0 lb

## 2016-10-29 DIAGNOSIS — G4733 Obstructive sleep apnea (adult) (pediatric): Secondary | ICD-10-CM | POA: Diagnosis not present

## 2016-10-29 DIAGNOSIS — D862 Sarcoidosis of lung with sarcoidosis of lymph nodes: Secondary | ICD-10-CM | POA: Diagnosis not present

## 2016-10-29 MED ORDER — BUDESONIDE-FORMOTEROL FUMARATE 160-4.5 MCG/ACT IN AERO
INHALATION_SPRAY | RESPIRATORY_TRACT | 2 refills | Status: DC
Start: 2016-10-29 — End: 2017-08-25

## 2016-10-29 NOTE — Patient Instructions (Signed)
Sarcoidosis seems to be stable  CPAP supplies will be renewed for a year  90 day refill on symbicort

## 2016-10-29 NOTE — Assessment & Plan Note (Addendum)
Appears stable Obtain spirometry and DLCO about once a year  Refills on Symbicort for asthma type symptoms will consider stopping this in the future

## 2016-10-29 NOTE — Progress Notes (Signed)
   Subjective:    Patient ID: Lindsey Copeland, female    DOB: [birth year], 58 y.o.   MRN: [id number]  HPI  56 year old remote smoker for FU of sarcoidosis & moderate obstructive sleep apnea She had asthma as a child, outgrew it and then symptoms returned after a chest cold in winter of 2013. She has Graves' disease status post radioactive iodine.   10/29/2016  Chief Complaint  Patient presents with  . Follow-up    6 month follow up for OSA. Still using CPAP machine. Uses [facility] for DME.     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 and frequent flares  Since then she has done better, feels that Symbicort helps has not needed albuterol very often  She was restarted on CPAP in 2017 after an initial failure in 2014 Feels that CPAP has helped her improve her daytime somnolence and fatigue, denies problems with mask and pressure, uses a full face mask. Download shows good control of events with average pressure of 15 cm, on auto settings with minimal leak, compliance about 5.5 hours on days used  She has several questions about sarcoidosis   Significant tests/ events  01/2013 PSG -wt 190 pounds, BMI 34 showed predominant hypopneas during supine sleep and with AHI of 16 events per hour. Nonsupine AHI was only 3 events per hour.    PFTs 07/2012 nml lung volumes, no obs, DLCO 71% CT chest 05/2013 Mediastinal/bilateral hilar lymphadenopathy, Peribronchovascular nodularity throughout all lobes, numerous lesions in the spleen. CXR 05/2015 decreased LNs    Review of Systems neg for any significant sore throat, dysphagia, itching, sneezing, nasal congestion or excess/ purulent secretions, fever, chills, sweats, unintended wt loss, pleuritic or exertional cp, hempoptysis, orthopnea pnd or change in chronic leg  swelling. Also denies presyncope, palpitations, heartburn, abdominal pain, nausea, vomiting, diarrhea or change in bowel or urinary habits, dysuria,hematuria, rash, arthralgias, visual complaints, headache, numbness weakness or ataxia.     Objective:   Physical Exam   Gen. Pleasant, obese, in no distress ENT - no lesions, no post nasal drip Neck: No JVD, no thyromegaly, no carotid bruits Lungs: no use of accessory muscles, no dullness to percussion, decreased without rales or rhonchi  Cardiovascular: Rhythm regular, heart sounds  normal, no murmurs or gallops, no peripheral edema Musculoskeletal: No deformities, no cyanosis or clubbing , no tremors        Assessment & Plan:

## 2016-10-29 NOTE — Assessment & Plan Note (Signed)
CPAP supplies will be renewed for a year  Weight loss encouraged, compliance with goal of at least 4-6 hrs every night is the expectation. Advised against medications with sedative side effects Cautioned against driving when sleepy - understanding that sleepiness will vary on a day to day basis  

## 2016-10-31 ENCOUNTER — Other Ambulatory Visit: Payer: Self-pay | Admitting: Family Medicine

## 2016-10-31 DIAGNOSIS — Z1231 Encounter for screening mammogram for malignant neoplasm of breast: Secondary | ICD-10-CM

## 2016-11-05 ENCOUNTER — Ambulatory Visit: Admitting: Licensed Clinical Social Worker

## 2016-11-18 ENCOUNTER — Ambulatory Visit
Admission: RE | Admit: 2016-11-18 | Discharge: 2016-11-18 | Disposition: A | Discharge status: Home / Self Care | Source: Ambulatory Visit | Attending: Family Medicine | Admitting: Family Medicine

## 2016-11-18 DIAGNOSIS — Z1231 Encounter for screening mammogram for malignant neoplasm of breast: Secondary | ICD-10-CM

## 2016-11-26 ENCOUNTER — Telehealth: Payer: Self-pay | Admitting: Pulmonary Disease

## 2016-11-26 MED ORDER — FLUTICASONE PROPIONATE 50 MCG/ACT NA SUSP: 2.0000 | Freq: Every day | NASAL | 3 refills | Status: DC | PRN

## 2016-11-26 NOTE — Telephone Encounter (Signed)
lmtcb X1 for pt. rx has been sent to preferred pharmacy.

## 2016-11-27 NOTE — Telephone Encounter (Signed)
Spoke with pt and she is aware that the rx was sent. She had no further questions. Nothing further is needed

## 2016-12-01 ENCOUNTER — Telehealth: Payer: Self-pay | Admitting: *Deleted

## 2016-12-01 NOTE — Telephone Encounter (Signed)
Received request for Medical records from [employer]ability [employer], [id number] [place] [facility] Teodoro Kilaleigh, Pt's PCP is at [facility] [name][Kevin Little, MD] but currently sees [facility]; forwarded to [email or web address] for email/scan/SLS 06/04

## 2016-12-02 ENCOUNTER — Ambulatory Visit (INDEPENDENT_AMBULATORY_CARE_PROVIDER_SITE_OTHER): Admitting: Licensed Clinical Social Worker

## 2016-12-02 DIAGNOSIS — F324 Major depressive disorder, single episode, in partial remission: Secondary | ICD-10-CM

## 2017-01-02 ENCOUNTER — Ambulatory Visit (INDEPENDENT_AMBULATORY_CARE_PROVIDER_SITE_OTHER): Admitting: Licensed Clinical Social Worker

## 2017-01-02 DIAGNOSIS — F324 Major depressive disorder, single episode, in partial remission: Secondary | ICD-10-CM | POA: Diagnosis not present

## 2017-02-13 ENCOUNTER — Ambulatory Visit (INDEPENDENT_AMBULATORY_CARE_PROVIDER_SITE_OTHER): Admitting: Licensed Clinical Social Worker

## 2017-02-13 ENCOUNTER — Telehealth: Payer: Self-pay | Admitting: Pulmonary Disease

## 2017-02-13 DIAGNOSIS — F324 Major depressive disorder, single episode, in partial remission: Secondary | ICD-10-CM

## 2017-02-13 MED ORDER — BUDESONIDE-FORMOTEROL FUMARATE 160-4.5 MCG/ACT IN AERO: 2.0000 | INHALATION_SPRAY | Freq: Two times a day (BID) | RESPIRATORY_TRACT | 0 refills | Status: DC

## 2017-02-13 NOTE — Telephone Encounter (Signed)
2 samples of the symbicort have been left up front for the pt. She is aware and will come by today to pick these up.

## 2017-02-17 ENCOUNTER — Ambulatory Visit: Admitting: Licensed Clinical Social Worker

## 2017-03-06 IMAGING — NM NM MISC PROCEDURE
6 series · 36 of 36 positions shown · non-contrast
Comparison: none

[Series 1: wbr_s-proj_st wbr stress-gsp · 6.40mm/px · 6 of 512 frames shown]
[frame 43/512]
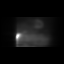
[frame 128/512]
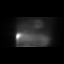
[frame 214/512]
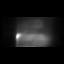
[frame 299/512]
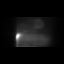
[frame 384/512]
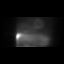
[frame 470/512]
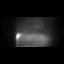

[Series 1: wbr stress-gsp · 6.40mm/px · 6 of 512 frames shown]
[frame 43/512]
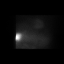
[frame 128/512]
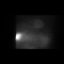
[frame 214/512]
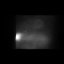
[frame 299/512]
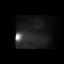
[frame 384/512]
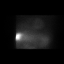
[frame 470/512]
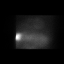

[Series 2: wbr_s-proj_st wbr stress-sum-em · 6.40mm/px · 6 of 64 frames shown]
[frame 6/64]
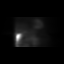
[frame 16/64]
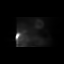
[frame 27/64]
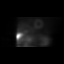
[frame 38/64]
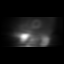
[frame 48/64]
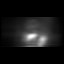
[frame 59/64]
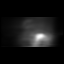

[Series 2: wbr stress-sum-em · 6.40mm/px · 6 of 64 frames shown]
[frame 6/64]
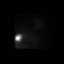
[frame 16/64]
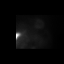
[frame 27/64]
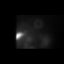
[frame 38/64]
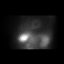
[frame 48/64]
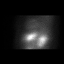
[frame 59/64]
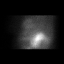

[Series 3: wbr_r-proj_st wbr rest · 6.40mm/px · 6 of 64 frames shown]
[frame 6/64]
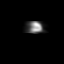
[frame 16/64]
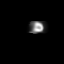
[frame 27/64]
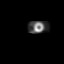
[frame 38/64]
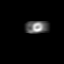
[frame 48/64]
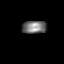
[frame 59/64]
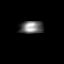

[Series 3: wbr rest · 6.40mm/px · 6 of 64 frames shown]
[frame 6/64]
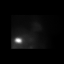
[frame 16/64]
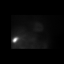
[frame 27/64]
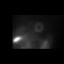
[frame 38/64]
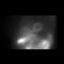
[frame 48/64]
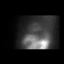
[frame 59/64]
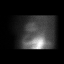

[36 of 36 positions shown; findings below may reference images not displayed]

Canned report from images found in remote index.

Refer to host system for actual result text.

## 2017-03-12 ENCOUNTER — Ambulatory Visit (INDEPENDENT_AMBULATORY_CARE_PROVIDER_SITE_OTHER): Admitting: Licensed Clinical Social Worker

## 2017-03-12 DIAGNOSIS — F324 Major depressive disorder, single episode, in partial remission: Secondary | ICD-10-CM | POA: Diagnosis not present

## 2017-04-02 ENCOUNTER — Ambulatory Visit: Admitting: Licensed Clinical Social Worker

## 2017-04-27 ENCOUNTER — Ambulatory Visit (INDEPENDENT_AMBULATORY_CARE_PROVIDER_SITE_OTHER): Admitting: Adult Health

## 2017-04-27 ENCOUNTER — Encounter: Payer: Self-pay | Admitting: Adult Health

## 2017-04-27 ENCOUNTER — Ambulatory Visit (INDEPENDENT_AMBULATORY_CARE_PROVIDER_SITE_OTHER)
Admission: RE | Admit: 2017-04-27 | Discharge: 2017-04-27 | Disposition: A | Discharge status: Home / Self Care | Source: Ambulatory Visit | Attending: Adult Health | Admitting: Adult Health

## 2017-04-27 VITALS — BP 124/66 | HR 71 | Temp 98.1°F | Ht 62.0 in | Wt 195.6 lb

## 2017-04-27 DIAGNOSIS — J209 Acute bronchitis, unspecified: Secondary | ICD-10-CM | POA: Diagnosis not present

## 2017-04-27 DIAGNOSIS — D862 Sarcoidosis of lung with sarcoidosis of lymph nodes: Secondary | ICD-10-CM

## 2017-04-27 MED ORDER — HYDROCODONE-HOMATROPINE 5-1.5 MG/5ML PO SYRP: 5.0000 mL | ORAL_SOLUTION | Freq: Four times a day (QID) | ORAL | 0 refills | Status: DC | PRN

## 2017-04-27 MED ORDER — LEVALBUTEROL HCL 0.63 MG/3ML IN NEBU
0.6300 mg | INHALATION_SOLUTION | Freq: Once | RESPIRATORY_TRACT | Status: AC
  Administered 2017-04-27: 0.63 mg via RESPIRATORY_TRACT

## 2017-04-27 MED ORDER — PREDNISONE 10 MG PO TABS: ORAL_TABLET | ORAL | 0 refills | Status: DC

## 2017-04-27 MED ORDER — AMOXICILLIN-POT CLAVULANATE 875-125 MG PO TABS: 1.0000 | ORAL_TABLET | Freq: Two times a day (BID) | ORAL | 0 refills | Status: AC

## 2017-04-27 NOTE — Addendum Note (Signed)
Addended by: Boone MasterJONES, JESSICA E on: 04/27/2017 03:17 PM   Modules accepted: Orders

## 2017-04-27 NOTE — Progress Notes (Signed)
@Patient  ID: Lindsey Copeland, female    DOB: [birth year], 56 y.o.   MRN: [id number]  Chief Complaint  Patient presents with  . Acute Visit    Sarcoid     Referring provider: [name], MD  HPI: 56 yo female former smoker followed for Sarcoid and moderate OSA    Significant tests/ events  01/2013 PSG -wt 190 pounds, BMI 34 showed predominant hypopneas during supine sleep and with AHI of 16 events per hour. Nonsupine AHI was only 3 events per hour.   PFTs 07/2012 nml lung volumes, no obs, DLCO 71% CT chest 05/2013 Mediastinal/bilateral hilar lymphadenopathy, Peribronchovascular nodularity throughout all lobes, numerous lesions in the spleen. CXR 05/2015 decreased LNs   04/27/2017 Acute OV : Sarcoid  Pt presents for an acute office visit.  She complains of a 5-day history of productive cough with thick yellow green mucus.  She has seen some blood-tinged streaking noted.  She complains of wheezing and chest tightness.  She has been more short of breath.  Has felt like she had a low-grade fever.  She denies any chest pain orthopnea PND or leg swelling.  Says cough is keeping her up at night.  She remains on Symbicort twice daily. She has not used any over-the-counter medications for treatment.  Allergies  Allergen Reactions  . Sulfa Antibiotics     hives  . Robaxin [Methocarbamol] Palpitations    Immunization History  Administered Date(s) Administered  . Influenza Split 04/08/2013, 03/28/2017  . Influenza Whole 04/30/2012  . Influenza,inj,Quad PF,6+ Mos 03/27/2015, 04/18/2016  . Influenza-Unspecified 03/30/2014    Past Medical History:  Diagnosis Date  . Asthma   . Carpal tunnel syndrome   . COPD (chronic obstructive pulmonary disease) (HCC)   . Dysrhythmia    palpitations  . GERD (gastroesophageal reflux disease)   . Graves disease   . Hyperlipidemia   . Hyperthyroidism   . Sarcoidosis   . Sleep apnea    CPAP  . Type 2 diabetes mellitus (HCC)   . Vitamin D  deficiency     Tobacco History: History  Smoking Status  . Former Smoker  . Packs/day: 0.10  . Years: 10.00  . Types: Cigarettes  . Quit date: 06/30/2002  Smokeless Tobacco  . Never Used   Counseling given: Not Answered   Outpatient Encounter Prescriptions as of 04/27/2017  Medication Sig  . acetaminophen (TYLENOL) 500 MG tablet Take 500 mg by mouth every 6 (six) hours as needed for mild pain or headache.   . albuterol (PROVENTIL HFA;VENTOLIN HFA) 108 (90 Base) MCG/ACT inhaler Inhale 2 puffs into the lungs every 6 (six) hours as needed for wheezing or shortness of breath.  Marland Kitchen aspirin 81 MG tablet Take 81 mg by mouth daily.  Marland Kitchen atorvastatin (LIPITOR) 80 MG tablet Take 80 mg by mouth daily.  . budesonide-formoterol (SYMBICORT) 160-4.5 MCG/ACT inhaler INHALE TWO PUFFS INTO LUNGS TWICE DAILY  . Cholecalciferol (VITAMIN D-3) 1000 UNITS CAPS Take 1 capsule by mouth daily.   . fluticasone (FLONASE) 50 MCG/ACT nasal spray Place 2 sprays into both nostrils daily as needed for allergies or rhinitis.  Marland Kitchen Hydrocodone-Acetaminophen (VICODIN PO) Take by mouth.  Marland Kitchen ibuprofen (ADVIL,MOTRIN) 800 MG tablet Take 800 mg by mouth every 8 (eight) hours as needed.  Marland Kitchen ketoconazole (NIZORAL) 2 % cream Apply 1 application topically 2 (two) times daily.  . meloxicam (MOBIC) 15 MG tablet Take 15 mg by mouth daily.  . metFORMIN (GLUCOPHAGE) 500 MG tablet Take 1,000 mg by mouth  every evening.   . Multiple Vitamin (MULTIVITAMIN) tablet Take 1 tablet by mouth daily.  Marland Kitchen omeprazole (PRILOSEC) 20 MG capsule Take 20 mg by mouth daily as needed (indigestion).   . SYMBICORT 160-4.5 MCG/ACT inhaler INHALE TWO PUFFS BY MOUTH TWICE DAILY  . traMADol (ULTRAM) 50 MG tablet Take 50 mg by mouth every 6 (six) hours as needed.  . ValACYclovir HCl (VALTREX PO) Take by mouth.  Marland Kitchen amoxicillin-clavulanate (AUGMENTIN) 875-125 MG tablet Take 1 tablet by mouth 2 (two) times daily.  Marland Kitchen HYDROcodone-homatropine (HYDROMET) 5-1.5 MG/5ML syrup  Take 5 mLs by mouth every 6 (six) hours as needed.  . predniSONE (DELTASONE) 10 MG tablet 4 tabs for 2 days, then 3 tabs for 2 days, 2 tabs for 2 days, then 1 tab for 2 days, then stop  . [DISCONTINUED] budesonide-formoterol (SYMBICORT) 160-4.5 MCG/ACT inhaler Inhale 2 puffs into the lungs 2 (two) times daily. (Patient not taking: Reported on 04/27/2017)   No facility-administered encounter medications on file as of 04/27/2017.      Review of Systems  Constitutional:   No  weight loss, night sweats,  Fevers, chills, fatigue, or  lassitude.  HEENT:   No headaches,  Difficulty swallowing,  Tooth/dental problems, or  Sore throat,                No sneezing, itching, ear ache,  +nasal congestion, post nasal drip,   CV:  No chest pain,  Orthopnea, PND, swelling in lower extremities, anasarca, dizziness, palpitations, syncope.   GI  No heartburn, indigestion, abdominal pain, nausea, vomiting, diarrhea, change in bowel habits, loss of appetite, bloody stools.   Resp: .  No chest wall deformity  Skin: no rash or lesions.  GU: no dysuria, change in color of urine, no urgency or frequency.  No flank pain, no hematuria   MS:  No joint pain or swelling.  No decreased range of motion.  No back pain.    Physical Exam  BP 124/66 (BP Location: Left Arm, Cuff Size: Normal)   Pulse 71   Temp 98.1 F (36.7 C) (Oral)   Ht 5\' 2"  (1.575 m)   Wt 195 lb 9.6 oz (88.7 kg)   SpO2 97%   BMI 35.78 kg/m   GEN: A/Ox3; pleasant , NAD , obese   HEENT:  [place]/AT,  EACs-clear, TMs-wnl, NOSE-clear, THROAT-clear, no lesions, no postnasal drip or exudate noted.   NECK:  Supple w/ fair ROM; no JVD; normal carotid impulses w/o bruits; no thyromegaly or nodules palpated; no lymphadenopathy.    RESP few expiratory wheezes.   no accessory muscle use, no dullness to percussion, speaks in full sentences.  CARD:  RRR, no m/r/g, no peripheral edema, pulses intact, no cyanosis or clubbing.  GI:   Soft & nt; nml  bowel sounds; no organomegaly or masses detected.   Musco: Warm bil, no deformities or joint swelling noted.   Neuro: alert, no focal deficits noted.    Skin: Warm, no lesions or rashes     BNP No results found for: BNP  ProBNP No results found for: PROBNP  Imaging: No results found.   Assessment & Plan:   Sarcoidosis of lung with sarcoidosis of lymph nodes Flare with associated bronchitis Check chest x-ray Steroid taper  Plan  Patient Instructions  Augmentin 875mg  Twice daily  For 1 week  Mucinex DM Twice daily  As needed  Cough/congestion  Prednisone taper over week.  Chest x-ray today Fluids and rest as needed Tylenol as needed  Follow-up Dr. Vassie LollAlva in 3 months and as needed Please contact office for sooner follow up if symptoms do not improve or worsen or seek emergency care       Acute bronchitis Flare  Check cxr  xopenex neb x 1   Plan  Patient Instructions  Augmentin 875mg  Twice daily  For 1 week  Mucinex DM Twice daily  As needed  Cough/congestion  Prednisone taper over week.  Chest x-ray today Fluids and rest as needed Tylenol as needed Follow-up Dr. Vassie LollAlva in 3 months and as needed Please contact office for sooner follow up if symptoms do not improve or worsen or seek emergency care          Rubye Oaksammy Avelynn Sellin, NP 04/27/2017

## 2017-04-27 NOTE — Patient Instructions (Addendum)
Augmentin 875mg  Twice daily  For 1 week  Mucinex DM Twice daily  As needed  Cough/congestion  Prednisone taper over week.  Chest x-ray today Fluids and rest as needed Tylenol as needed Hydromet As needed  Cough , may make you sleepy .  Follow-up Dr. Vassie LollAlva in 3 months and as needed Please contact office for sooner follow up if symptoms do not improve or worsen or seek emergency care

## 2017-04-27 NOTE — Assessment & Plan Note (Signed)
Flare  Check cxr  xopenex neb x 1   Plan  Patient Instructions  Augmentin 875mg  Twice daily  For 1 week  Mucinex DM Twice daily  As needed  Cough/congestion  Prednisone taper over week.  Chest x-ray today Fluids and rest as needed Tylenol as needed Follow-up Dr. Vassie LollAlva in 3 months and as needed Please contact office for sooner follow up if symptoms do not improve or worsen or seek emergency care

## 2017-04-27 NOTE — Assessment & Plan Note (Addendum)
Flare with associated bronchitis Check chest x-ray Steroid taper  Plan  Patient Instructions  Augmentin 875mg  Twice daily  For 1 week  Mucinex DM Twice daily  As needed  Cough/congestion  Prednisone taper over week.  Chest x-ray today Fluids and rest as needed Tylenol as needed Follow-up Dr. Vassie LollAlva in 3 months and as needed Please contact office for sooner follow up if symptoms do not improve or worsen or seek emergency care

## 2017-04-28 ENCOUNTER — Ambulatory Visit: Payer: Self-pay | Admitting: Licensed Clinical Social Worker

## 2017-04-28 MED ORDER — LEVALBUTEROL HCL 0.63 MG/3ML IN NEBU: 0.6300 mg | INHALATION_SOLUTION | Freq: Once | RESPIRATORY_TRACT | Status: DC

## 2017-04-28 NOTE — Addendum Note (Signed)
Addended by: Boone MasterJONES, JESSICA E on: 04/28/2017 10:31 AM   Modules accepted: Orders

## 2017-04-28 NOTE — Progress Notes (Signed)
Left message w/ spouse for pt to call back

## 2017-05-01 ENCOUNTER — Ambulatory Visit: Payer: Self-pay | Admitting: Adult Health

## 2017-05-19 ENCOUNTER — Ambulatory Visit (INDEPENDENT_AMBULATORY_CARE_PROVIDER_SITE_OTHER): Admitting: Licensed Clinical Social Worker

## 2017-05-19 DIAGNOSIS — F324 Major depressive disorder, single episode, in partial remission: Secondary | ICD-10-CM

## 2017-07-01 ENCOUNTER — Ambulatory Visit (INDEPENDENT_AMBULATORY_CARE_PROVIDER_SITE_OTHER): Admitting: Licensed Clinical Social Worker

## 2017-07-01 DIAGNOSIS — F324 Major depressive disorder, single episode, in partial remission: Secondary | ICD-10-CM

## 2017-07-13 ENCOUNTER — Ambulatory Visit (INDEPENDENT_AMBULATORY_CARE_PROVIDER_SITE_OTHER): Admitting: Pulmonary Disease

## 2017-07-13 ENCOUNTER — Encounter: Payer: Self-pay | Admitting: Pulmonary Disease

## 2017-07-13 VITALS — BP 122/78 | HR 91 | Ht 62.0 in | Wt 196.8 lb

## 2017-07-13 DIAGNOSIS — D869 Sarcoidosis, unspecified: Secondary | ICD-10-CM | POA: Diagnosis not present

## 2017-07-13 DIAGNOSIS — G4733 Obstructive sleep apnea (adult) (pediatric): Secondary | ICD-10-CM | POA: Diagnosis not present

## 2017-07-13 DIAGNOSIS — D862 Sarcoidosis of lung with sarcoidosis of lymph nodes: Secondary | ICD-10-CM | POA: Diagnosis not present

## 2017-07-13 NOTE — Assessment & Plan Note (Addendum)
Lung function is maintained.  Try to minimize use of albuterol For persistent dyspnea, schedule echo to follow-up on mitral valve prolapse from 2013

## 2017-07-13 NOTE — Patient Instructions (Signed)
Lung function is maintained. We will obtain download on CPAP machine  Try to minimize use of albuterol

## 2017-07-13 NOTE — Assessment & Plan Note (Signed)
We will obtain download on CPAP machine  Weight loss encouraged, compliance with goal of at least 4-6 hrs every night is the expectation. Advised against medications with sedative side effects Cautioned against driving when sleepy - understanding that sleepiness will vary on a day to day basis

## 2017-07-13 NOTE — Progress Notes (Signed)
   Subjective:    Patient ID: Lindsey Copeland, female    DOB: 02/15/1961, 57 y.o.   MRN: [id number]  HPI  57 year old remote smoker for FU of sarcoidosis & moderate obstructive sleep apnea She had asthma as a child, outgrew it and then symptoms returned after a chest cold in winter of 2013. She has Graves' disease status post radioactive iodine.  She was restarted on CPAP in 2017 after an initial failure in 2014  Chief Complaint  Patient presents with  . Follow-up    follow up for sarcoidosis. States she has to use Symbicort 160 and Ventolin in order to catch her breath.    Received augmentin & pred in oct 2018 She complains of mild shortness of breath and has to use her Ventolin within 1-2 hours after using her Symbicort.  She denies cough or sputum production or recent head cold.  Her CPAP machine is working well, it fell down and she has a crack on it but it was checked out by DME and seems to be working okay.  She wonders if she will qualify for a new machine.  No problems with mask or pressure.  She had an echo in 2013 which showed mild mitral regurg and tricuspid regurg she denies paroxysmal nocturnal dyspnea, orthopnea or pedal edema      Significant tests/ events  06/2012 TBBx neg 01/2013 PSG -wt 190 pounds, BMI 34 showed predominant hypopneas during supine sleep and with AHI of 16 events per hour. Nonsupine AHI was only 3 events per hour.    PFTs 07/2012 nml lung volumes, no obs, DLCO 71% PFT 10/2014 >> ratio 80, FVC 104%, FEV1 108%, DLCO 83%-normal   CT chest 05/2013 Mediastinal/bilateral hilar lymphadenopathy, Peribronchovascular nodularity throughout all lobes, numerous lesions in the spleen. CXR 05/2015 decreased LNs    Review of Systems neg for any significant sore throat, dysphagia, itching, sneezing, nasal congestion or excess/ purulent secretions, fever, chills, sweats, unintended wt loss, pleuritic or exertional cp, hempoptysis, orthopnea pnd or change in  chronic leg swelling. Also denies presyncope, palpitations, heartburn, abdominal pain, nausea, vomiting, diarrhea or change in bowel or urinary habits, dysuria,hematuria, rash, arthralgias, visual complaints, headache, numbness weakness or ataxia.     Objective:   Physical Exam  Gen. Pleasant, obese, in no distress ENT - no lesions, no post nasal drip Neck: No JVD, no thyromegaly, no carotid bruits Lungs: no use of accessory muscles, no dullness to percussion, decreased without rales or rhonchi  Cardiovascular: Rhythm regular, heart sounds  normal, no murmurs or gallops, no peripheral edema Musculoskeletal: No deformities, no cyanosis or clubbing , no tremors       Assessment & Plan:

## 2017-07-21 ENCOUNTER — Ambulatory Visit
Admission: RE | Admit: 2017-07-21 | Discharge: 2017-07-21 | Disposition: A | Discharge status: Home / Self Care | Source: Ambulatory Visit | Attending: Family Medicine | Admitting: Family Medicine

## 2017-07-21 ENCOUNTER — Other Ambulatory Visit: Payer: Self-pay | Admitting: Family Medicine

## 2017-07-21 DIAGNOSIS — R519 Headache, unspecified: Secondary | ICD-10-CM

## 2017-07-21 DIAGNOSIS — H5461 Unqualified visual loss, right eye, normal vision left eye: Secondary | ICD-10-CM

## 2017-07-21 DIAGNOSIS — R51 Headache: Secondary | ICD-10-CM

## 2017-08-12 ENCOUNTER — Ambulatory Visit (INDEPENDENT_AMBULATORY_CARE_PROVIDER_SITE_OTHER): Admitting: Licensed Clinical Social Worker

## 2017-08-12 DIAGNOSIS — F324 Major depressive disorder, single episode, in partial remission: Secondary | ICD-10-CM | POA: Diagnosis not present

## 2017-08-22 ENCOUNTER — Other Ambulatory Visit (HOSPITAL_BASED_OUTPATIENT_CLINIC_OR_DEPARTMENT_OTHER): Payer: Self-pay | Admitting: Family Medicine

## 2017-08-22 DIAGNOSIS — H5340 Unspecified visual field defects: Secondary | ICD-10-CM

## 2017-08-25 ENCOUNTER — Ambulatory Visit (HOSPITAL_BASED_OUTPATIENT_CLINIC_OR_DEPARTMENT_OTHER)

## 2017-08-25 ENCOUNTER — Telehealth: Payer: Self-pay | Admitting: Pulmonary Disease

## 2017-08-25 MED ORDER — BUDESONIDE-FORMOTEROL FUMARATE 160-4.5 MCG/ACT IN AERO: INHALATION_SPRAY | RESPIRATORY_TRACT | 2 refills | Status: DC

## 2017-08-25 NOTE — Telephone Encounter (Signed)
Rx's sent. To the pharmacy. ATC pt, no answer. Left message for pt to call back.

## 2017-08-26 ENCOUNTER — Ambulatory Visit (HOSPITAL_BASED_OUTPATIENT_CLINIC_OR_DEPARTMENT_OTHER)
Admission: RE | Admit: 2017-08-26 | Discharge: 2017-08-26 | Disposition: A | Discharge status: Home / Self Care | Source: Ambulatory Visit | Attending: Family Medicine | Admitting: Family Medicine

## 2017-08-26 DIAGNOSIS — H5340 Unspecified visual field defects: Secondary | ICD-10-CM | POA: Insufficient documentation

## 2017-08-26 NOTE — Telephone Encounter (Signed)
Spoke with pt. She is aware that we have refilled Symbicort. Nothing further was needed.

## 2017-08-29 ENCOUNTER — Ambulatory Visit (HOSPITAL_BASED_OUTPATIENT_CLINIC_OR_DEPARTMENT_OTHER): Admission: RE | Admit: 2017-08-29 | Source: Ambulatory Visit

## 2017-09-01 NOTE — Telephone Encounter (Signed)
Received a fax from [facility] stating that the patient needed a PA for her Symbicort. Per patient's chart, she has not tried and failed Advair. She has been on Symbicort for years.   Will fax form back to [facility].

## 2017-09-05 ENCOUNTER — Ambulatory Visit (HOSPITAL_BASED_OUTPATIENT_CLINIC_OR_DEPARTMENT_OTHER)
Admission: RE | Admit: 2017-09-05 | Discharge: 2017-09-05 | Disposition: A | Discharge status: Home / Self Care | Source: Ambulatory Visit | Attending: Family Medicine | Admitting: Family Medicine

## 2017-09-05 DIAGNOSIS — H5340 Unspecified visual field defects: Secondary | ICD-10-CM

## 2017-09-05 MED ORDER — GADOBENATE DIMEGLUMINE 529 MG/ML IV SOLN
20.0000 mL | Freq: Once | INTRAVENOUS | Status: AC | PRN
  Administered 2017-09-05: 18 mL via INTRAVENOUS

## 2017-09-08 NOTE — Telephone Encounter (Signed)
Received a fax from [facility] stating that the patient's Symbicort has been approved until 06/29/2098.

## 2017-09-11 ENCOUNTER — Encounter: Payer: Self-pay | Admitting: Adult Health

## 2017-09-11 ENCOUNTER — Ambulatory Visit (INDEPENDENT_AMBULATORY_CARE_PROVIDER_SITE_OTHER): Admitting: Adult Health

## 2017-09-11 DIAGNOSIS — R0602 Shortness of breath: Secondary | ICD-10-CM

## 2017-09-11 DIAGNOSIS — R0600 Dyspnea, unspecified: Secondary | ICD-10-CM | POA: Insufficient documentation

## 2017-09-11 DIAGNOSIS — D862 Sarcoidosis of lung with sarcoidosis of lymph nodes: Secondary | ICD-10-CM

## 2017-09-11 DIAGNOSIS — G4733 Obstructive sleep apnea (adult) (pediatric): Secondary | ICD-10-CM | POA: Diagnosis not present

## 2017-09-11 DIAGNOSIS — D869 Sarcoidosis, unspecified: Secondary | ICD-10-CM | POA: Diagnosis not present

## 2017-09-11 MED ORDER — FLUTICASONE-SALMETEROL 230-21 MCG/ACT IN AERO: 2.0000 | INHALATION_SPRAY | Freq: Two times a day (BID) | RESPIRATORY_TRACT | 1 refills | Status: DC

## 2017-09-11 MED ORDER — FLUTICASONE-SALMETEROL 230-21 MCG/ACT IN AERO: 2.0000 | INHALATION_SPRAY | Freq: Two times a day (BID) | RESPIRATORY_TRACT | 0 refills | Status: DC

## 2017-09-11 NOTE — Assessment & Plan Note (Signed)
Ongoing dyspnea questionable etiology.  Will check CT chest looking for progressive sarcoid changes. Patient is to increase activity as tolerated. Recent spirometry showed normal lung function Patient is to follow-up with cardiology as discussed.  Previous workup in 2017 with negative stress test

## 2017-09-11 NOTE — Addendum Note (Signed)
Addended by: Boone MasterJONES, JESSICA E on: 09/11/2017 02:53 PM   Modules accepted: Orders

## 2017-09-11 NOTE — Assessment & Plan Note (Signed)
Doing well on CPAP  Plan  Patient Instructions  Remain on CPAP At bedtime   Work on healthy weight Do not drive a sleepy Continue on Symbicort twice daily until gone then switch to Advair 2 puffs  Twice daily  , rinse after use.  Set up for CT chest .  Follow up with cardiology as discussed  Follow-up with Dr. Vassie LollAlva in 3  months and as needed Please contact office for sooner follow up if symptoms do not improve or worsen or seek emergency care

## 2017-09-11 NOTE — Assessment & Plan Note (Signed)
Sarcoid with ongoing dyspnea despite a normal spirometry Spirometry in January shows stable lung function with no airflow obstruction or restriction  Patient continues to have ongoing shortness of breath.  We will repeat CT chest looking for sarcoid changes or progression. O2 saturations are normal at 98% on room air.  Plan  Patient Instructions  Remain on CPAP At bedtime   Work on healthy weight Do not drive a sleepy Continue on Symbicort twice daily until gone then switch to Advair 2 puffs  Twice daily  , rinse after use.  Set up for CT chest .  Follow up with cardiology as discussed  Follow-up with Dr. Vassie LollAlva in 3  months and as needed Please contact office for sooner follow up if symptoms do not improve or worsen or seek emergency care

## 2017-09-11 NOTE — Patient Instructions (Addendum)
Remain on CPAP At bedtime   Work on healthy weight Do not drive a sleepy Continue on Symbicort twice daily until gone then switch to Advair 2 puffs  Twice daily  , rinse after use.  Set up for CT chest .  Follow up with cardiology as discussed  Follow-up with Dr. Vassie LollAlva in 3  months and as needed Please contact office for sooner follow up if symptoms do not improve or worsen or seek emergency care

## 2017-09-11 NOTE — Progress Notes (Signed)
 @Patient  ID: Lindsey EchevariaJanie Spratt, female    DOB: 02/06/1961, 57 y.o.   MRN: [id number]  Chief Complaint  Patient presents with  . Follow-up    Referring provider: Catha GosselinLittle, Kevin, MD  HPI: 57 yo female former smoker followed for Sarcoid and moderate OSA    Significant tests/ events  01/2013 PSG -wt 190 pounds, BMI 34 showed predominant hypopneas during supine sleep and with AHI of 16 events per hour. Nonsupine AHI was only 3 events per hour.   PFTs 07/2012 nml lung volumes, no obs, DLCO 71% PFT 10/2014 >> ratio 80, FVC 104%, FEV1 108%, DLCO 83%-normal CT chest 05/2013 Mediastinal/bilateral hilar lymphadenopathy, Peribronchovascular nodularity throughout all lobes, numerous lesions in the spleen. CXR 05/2015 decreased LNs   09/11/2017 Follow up : Sarcoid and OSA  Patient presents for a 7789-month follow-up.  Patient has underlying sarcoid Says breathing is doing about the same . Gets winded with minimal activity . Is frustrated that she gets sob , wants to know if Sarcoid is getting worse . No increased cough . No rash . No wheezing .  Insurance is no longer covering Symbicort and will only cover Advair .   Has been seeing PCP for episode of temporary vision loss. , had CT head, MRI and Carotid US    Patient has known obstructive sleep apnea on CPAP at bedtime Says CPAP is helping feels rested.  Download shows excellent compliance with average usage at 5.5 hours.  Patient is on AutoSet 5-15 Summers H2O.  AHI is 1.1.   Allergies  Allergen Reactions  . Sulfa Antibiotics     hives  . Robaxin [Methocarbamol] Palpitations    Immunization History  Administered Date(s) Administered  . Influenza Split 04/08/2013, 03/28/2017  . Influenza Whole 04/30/2012  . Influenza,inj,Quad PF,6+ Mos 03/27/2015, 04/18/2016  . Influenza-Unspecified 03/30/2014    Past Medical History:  Diagnosis Date  . Asthma   . Carpal tunnel syndrome   . COPD (chronic obstructive pulmonary disease) (HCC)   .  Dysrhythmia    palpitations  . GERD (gastroesophageal reflux disease)   . Graves disease   . Hyperlipidemia   . Hyperthyroidism   . Sarcoidosis   . Sleep apnea    CPAP  . Type 2 diabetes mellitus (HCC)   . Vitamin D deficiency     Tobacco History: Social History   Tobacco Use  Smoking Status Former Smoker  . Packs/day: 0.10  . Years: 10.00  . Pack years: 1.00  . Types: Cigarettes  . Last attempt to quit: 06/30/2002  . Years since quitting: 15.2  Smokeless Tobacco Never Used   Counseling given: Not Answered   Outpatient Encounter Medications as of 09/11/2017  Medication Sig  . acetaminophen (TYLENOL) 500 MG tablet Take 500 mg by mouth every 6 (six) hours as needed for mild pain or headache.   . albuterol (PROVENTIL HFA;VENTOLIN HFA) 108 (90 Base) MCG/ACT inhaler Inhale 2 puffs into the lungs every 6 (six) hours as needed for wheezing or shortness of breath.  Marland Kitchen. aspirin 81 MG tablet Take 81 mg by mouth daily.  Marland Kitchen. atorvastatin (LIPITOR) 80 MG tablet Take 80 mg by mouth daily.  . budesonide-formoterol (SYMBICORT) 160-4.5 MCG/ACT inhaler INHALE TWO PUFFS INTO LUNGS TWICE DAILY  . Cholecalciferol (VITAMIN D-3) 1000 UNITS CAPS Take 1 capsule by mouth daily.   . fluticasone (FLONASE) 50 MCG/ACT nasal spray Place 2 sprays into both nostrils daily as needed for allergies or rhinitis.  Marland Kitchen. ibuprofen (ADVIL,MOTRIN) 800 MG tablet Take 800  mg by mouth every 8 (eight) hours as needed.  Marland Kitchen ketoconazole (NIZORAL) 2 % cream Apply 1 application topically 2 (two) times daily.  . meloxicam (MOBIC) 15 MG tablet Take 15 mg by mouth daily.  . metFORMIN (GLUCOPHAGE) 500 MG tablet Take 1,000 mg by mouth every evening.   . Multiple Vitamin (MULTIVITAMIN) tablet Take 1 tablet by mouth daily.  Marland Kitchen omeprazole (PRILOSEC) 20 MG capsule Take 20 mg by mouth daily as needed (indigestion).   . traMADol (ULTRAM) 50 MG tablet Take 50 mg by mouth every 6 (six) hours as needed.  . ValACYclovir HCl (VALTREX PO) Take by  mouth.   No facility-administered encounter medications on file as of 09/11/2017.      Review of Systems  Constitutional:   No  weight loss, night sweats,  Fevers, chills,  +fatigue, or  lassitude.  HEENT:   No headaches,  Difficulty swallowing,  Tooth/dental problems, or  Sore throat,                No sneezing, itching, ear ache, nasal congestion, post nasal drip,   CV:  No chest pain,  Orthopnea, PND, swelling in lower extremities, anasarca, dizziness, palpitations, syncope.   GI  No heartburn, indigestion, abdominal pain, nausea, vomiting, diarrhea, change in bowel habits, loss of appetite, bloody stools.   Resp:   No chest wall deformity  Skin: no rash or lesions.  GU: no dysuria, change in color of urine, no urgency or frequency.  No flank pain, no hematuria   MS:  No joint pain or swelling.  No decreased range of motion.  No back pain.    Physical Exam  BP 132/76 (BP Location: Left Arm, Cuff Size: Large)   Pulse 74   Ht 5' (1.524 m)   SpO2 98%   BMI 38.43 kg/m   GEN: A/Ox3; pleasant , NAD, elderly , obese    HEENT:  [place]/AT,  EACs-clear, TMs-wnl, NOSE-clear, THROAT-clear, no lesions, no postnasal drip or exudate noted. Class 3 MP airway   NECK:  Supple w/ fair ROM; no JVD; normal carotid impulses w/o bruits; no thyromegaly or nodules palpated; no lymphadenopathy.    RESP  Clear  P & A; w/o, wheezes/ rales/ or rhonchi. no accessory muscle use, no dullness to percussion  CARD:  RRR, no m/r/g, no peripheral edema, pulses intact, no cyanosis or clubbing.  GI:   Soft & nt; nml bowel sounds; no organomegaly or masses detected.   Musco: Warm bil, no deformities or joint swelling noted. Arthritic changes   Neuro: alert, no focal deficits noted.    Skin: Warm, no lesions or rashes    Lab Results:  CBC    Component Value Date/Time   HGB 13.6 10/09/2015 0730   HCT 40.0 10/09/2015 0730    BMET    Component Value Date/Time   NA 141 10/09/2015 0730   K 3.7  10/09/2015 0730   CL 105 10/09/2015 0730   CO2 29 02/19/2015 0930   GLUCOSE 93 10/09/2015 0730   BUN 17 10/09/2015 0730   CREATININE 0.60 10/09/2015 0730   CALCIUM 9.4 02/19/2015 0930   GFRNONAA >60 02/19/2015 0930   GFRAA >60 02/19/2015 0930    BNP No results found for: BNP  ProBNP No results found for: PROBNP  Imaging: Mr Laqueta Jean WU Contrast  Result Date: 09/05/2017 CLINICAL DATA:  Loss of peripheral vision in the right eye for 15 minutes 6 weeks ago. Frontal headaches for 6 weeks. EXAM: MRI HEAD WITHOUT  AND WITH CONTRAST TECHNIQUE: Multiplanar, multiecho pulse sequences of the brain and surrounding structures were obtained without and with intravenous contrast. CONTRAST:  18mL MULTIHANCE GADOBENATE DIMEGLUMINE 529 MG/ML IV SOLN COMPARISON:  Head CT 07/21/2017 FINDINGS: Brain: No explanation for symptoms. No infarct or other abnormality seen along the optic pathways. The suprasellar cistern is clear and the chiasm is normal appearing. There are rare FLAIR hyperintensities in the cerebral white matter, age normal. No demyelinating pattern. Normal brain volume. No blood products, hydrocephalus, or collection. Vascular: Major flow voids are preserved. Skull and upper cervical spine: Negative Sinuses/Orbits: Negative IMPRESSION: Negative brain MRI. Electronically Signed   By: Marnee Spring M.D.   On: 09/05/2017 13:25   US Carotid Bilateral  Result Date: 08/27/2017 CLINICAL DATA:  Visual field loss.  History of TIA and CVA. EXAM: BILATERAL CAROTID DUPLEX ULTRASOUND TECHNIQUE: Wallace Cullens scale imaging, color Doppler and duplex ultrasound were performed of bilateral carotid and vertebral arteries in the neck. COMPARISON:  None. FINDINGS: Criteria: Quantification of carotid stenosis is based on velocity parameters that correlate the residual internal carotid diameter with NASCET-based stenosis levels, using the diameter of the distal internal carotid lumen as the denominator for stenosis measurement.  The following velocity measurements were obtained: RIGHT ICA:  98/24 cm/sec CCA:  200/23 cm/sec SYSTOLIC ICA/CCA RATIO:  0.5 DIASTOLIC ICA/CCA RATIO:  1.0 ECA:  91 cm/sec LEFT ICA:  64/22 cm/sec CCA:  113/23 cm/sec SYSTOLIC ICA/CCA RATIO:  0.6 DIASTOLIC ICA/CCA RATIO:  1.0 ECA:  68 cm/sec RIGHT CAROTID ARTERY: There is a minimal amount of hypoechoic plaque within the right carotid bulb, not resulting in elevated peak systolic velocities within the interrogated course the right internal carotid artery to suggest a hemodynamically significant stenosis. Borderline elevated peak systolic velocity within distal aspect the right internal carotid artery is felt to be factitious Lea elevated due to sampling at a location of turbulent flow. RIGHT VERTEBRAL ARTERY:  Antegrade flow LEFT CAROTID ARTERY: There is a minimal amount of atherosclerotic plaque within the left carotid bulb (image 47), extending to involve the origin and proximal aspect the left internal carotid artery (54) not definitely resulting in elevated peak systolic velocities within the interrogated course the left internal carotid artery to suggest a hemodynamically significant stenosis. Borderline elevated peak systolic velocity within the mid aspect the left internal carotid artery is felt to be factitious elevated due to sampling at a location of turbulent flow. LEFT VERTEBRAL ARTERY:  Antegrade flow IMPRESSION: Minimal amount of bilateral atherosclerotic plaque, not definitively resulting in a hemodynamically significant stenosis within either internal carotid artery. Electronically Signed   By: Simonne Come M.D.   On: 08/27/2017 08:30     Assessment & Plan:   OSA (obstructive sleep apnea) Doing well on CPAP  Plan  Patient Instructions  Remain on CPAP At bedtime   Work on healthy weight Do not drive a sleepy Continue on Symbicort twice daily until gone then switch to Advair 2 puffs  Twice daily  , rinse after use.  Set up for CT chest .    Follow up with cardiology as discussed  Follow-up with Dr. Vassie Loll in 3  months and as needed Please contact office for sooner follow up if symptoms do not improve or worsen or seek emergency care      Sarcoidosis of lung with sarcoidosis of lymph nodes Sarcoid with ongoing dyspnea despite a normal spirometry Spirometry in January shows stable lung function with no airflow obstruction or restriction  Patient continues to have  ongoing shortness of breath.  We will repeat CT chest looking for sarcoid changes or progression. O2 saturations are normal at 98% on room air.  Plan  Patient Instructions  Remain on CPAP At bedtime   Work on healthy weight Do not drive a sleepy Continue on Symbicort twice daily until gone then switch to Advair 2 puffs  Twice daily  , rinse after use.  Set up for CT chest .  Follow up with cardiology as discussed  Follow-up with Dr. Vassie Loll in 3  months and as needed Please contact office for sooner follow up if symptoms do not improve or worsen or seek emergency care        Dyspnea Ongoing dyspnea questionable etiology.  Will check CT chest looking for progressive sarcoid changes. Patient is to increase activity as tolerated. Recent spirometry showed normal lung function Patient is to follow-up with cardiology as discussed.  Previous workup in 2017 with negative stress test      Rubye Oaks, NP 09/11/2017

## 2017-09-15 ENCOUNTER — Telehealth: Payer: Self-pay | Admitting: Adult Health

## 2017-09-15 NOTE — Telephone Encounter (Signed)
Ok that is fine

## 2017-09-15 NOTE — Telephone Encounter (Signed)
Patient was seen 3.15.19 with a letter from [employer] stating that her Symbicort was no longer covered for 2019 with only Advair listed as covered alternatives.  Per patient's our office received documentation on 3.12.18 that the Symbicort had been approved until 2099 but we were unaware of this at time of visit.  Just letting TP know that patient would like to continue her Symbicort.

## 2017-09-15 NOTE — Telephone Encounter (Signed)
FYI- Pt dropped off a letter from [employer] stating that her Symbicort was approved  She wanted TP to be aware of this since TP told her to take advair once she finished the symbicort she had  She just wants TP to know her she already has a 90 day supply symbicort and will continue on this

## 2017-09-17 ENCOUNTER — Telehealth: Payer: Self-pay | Admitting: Cardiology

## 2017-09-17 NOTE — Telephone Encounter (Signed)
New message     Pt c/o of Chest Pain: STAT if CP now or developed within 24 hours  1. Are you having CP right now?  no  2. Are you experiencing any other symptoms (ex. SOB, nausea, vomiting, sweating)? Sob   3. How long have you been experiencing CP? A few weeks   4. Is your CP continuous or coming and going? They come and go    5. Have you taken Nitroglycerin?  No   ?

## 2017-09-17 NOTE — Telephone Encounter (Signed)
I spoke with patient she states, she has been having off and on mild chest pain for the past few weeks. Chest pain can come on while she is sitting, she describes pain on the left side of her chest, non radiating. She states it feels the same as in the past and she is under a lot of stress. She denies palpitations, lightheadedness, dizziness or n/v. She also c/o mild sob, and HA. She was seen by Dr. Vassie LollAlva her pulmonologist who suggest a chest CT and follow up with cardiologist. Patient is scheduled with Herma CarsonMichelle Lenze, PA on 09/22/17. Informed her I would forward to Dr. Mayford Knifeurner for further recommendations. Patient in agreement with plan and thanked me for the call

## 2017-09-22 ENCOUNTER — Encounter: Payer: Self-pay | Admitting: *Deleted

## 2017-09-22 ENCOUNTER — Ambulatory Visit (INDEPENDENT_AMBULATORY_CARE_PROVIDER_SITE_OTHER): Admitting: Physician Assistant

## 2017-09-22 ENCOUNTER — Encounter: Payer: Self-pay | Admitting: Physician Assistant

## 2017-09-22 VITALS — BP 122/78 | HR 83 | Ht 60.0 in | Wt 197.8 lb

## 2017-09-22 DIAGNOSIS — E782 Mixed hyperlipidemia: Secondary | ICD-10-CM | POA: Diagnosis not present

## 2017-09-22 DIAGNOSIS — R0609 Other forms of dyspnea: Secondary | ICD-10-CM | POA: Diagnosis not present

## 2017-09-22 DIAGNOSIS — Z8249 Family history of ischemic heart disease and other diseases of the circulatory system: Secondary | ICD-10-CM

## 2017-09-22 DIAGNOSIS — R0789 Other chest pain: Secondary | ICD-10-CM

## 2017-09-22 DIAGNOSIS — R079 Chest pain, unspecified: Secondary | ICD-10-CM

## 2017-09-22 DIAGNOSIS — G4733 Obstructive sleep apnea (adult) (pediatric): Secondary | ICD-10-CM

## 2017-09-22 DIAGNOSIS — D862 Sarcoidosis of lung with sarcoidosis of lymph nodes: Secondary | ICD-10-CM

## 2017-09-22 NOTE — Patient Instructions (Addendum)
Medication Instructions:  Your physician recommends that you continue on your current medications as directed. Please refer to the Current Medication list given to you today.   Labwork: None ordered  Testing/Procedures: Your physician has requested that you have en exercise stress myoview. For further information please visit [email or web address]. Please follow instruction sheet, as given.  Your physician has requested that you have an echocardiogram. Echocardiography is a painless test that uses sound waves to create images of your heart. It provides your doctor with information about the size and shape of your heart and how well your heart's chambers and valves are working. This procedure takes approximately one hour. There are no restrictions for this procedure.    Follow-Up: Your physician recommends that you schedule a follow-up appointment in: 1 WEEK AFTER TESTING IS COMPLETE WITH MICHELE LENZE, PA-C   Any Other Special Instructions Will Be Listed Below (If Applicable).     If you need a refill on your cardiac medications before your next appointment, please call your pharmacy.  Echocardiogram An echocardiogram, or echocardiography, uses sound waves (ultrasound) to produce an image of your heart. The echocardiogram is simple, painless, obtained within a short period of time, and offers valuable information to your health care provider. The images from an echocardiogram can provide information such as:  Evidence of coronary artery disease (CAD).  Heart size.  Heart muscle function.  Heart valve function.  Aneurysm detection.  Evidence of a past heart attack.  Fluid buildup around the heart.  Heart muscle thickening.  Assess heart valve function.  Tell a health care provider about:  Any allergies you have.  All medicines you are taking, including vitamins, herbs, eye drops, creams, and over-the-counter medicines.  Any problems you or family members have had with  anesthetic medicines.  Any blood disorders you have.  Any surgeries you have had.  Any medical conditions you have.  Whether you are pregnant or may be pregnant. What happens before the procedure? No special preparation is needed. Eat and drink normally. What happens during the procedure?  In order to produce an image of your heart, gel will be applied to your chest and a wand-like tool (transducer) will be moved over your chest. The gel will help transmit the sound waves from the transducer. The sound waves will harmlessly bounce off your heart to allow the heart images to be captured in real-time motion. These images will then be recorded.  You may need an IV to receive a medicine that improves the quality of the pictures. What happens after the procedure? You may return to your normal schedule including diet, activities, and medicines, unless your health care provider tells you otherwise. This information is not intended to replace advice given to you by your health care provider. Make sure you discuss any questions you have with your health care provider. Document Released: 06/13/2000 Document Revised: 02/02/2016 Document Reviewed: 02/21/2013 Elsevier Interactive Patient Education  2017 [employer]

## 2017-09-22 NOTE — Progress Notes (Signed)
Cardiology Office Note    Date:  09/22/2017   ID:  Lindsey Copeland, DOB Dec 03, 1960, MRN [id number]  PCP:  [name], MD  Cardiologist: Armanda Magic, MD  Chief Complaint  Patient presents with  . Shortness of Breath    History of Present Illness:  Lindsey Copeland is a 56 y.o. female with history of sarcoid lung, COPD and sleep apnea on CPAP, history of chest pain and shortness of breath, DM, HLD and remote tobacco abuse.Normal nuclear stress test 08/2015. Saw Dr. Mayford Knife 09/2015 and told to f/u prn.  Saw pulmonary 09/11/17 complaining of ongoing shortness of breath despite spirometry tree in January showing stable lung function with no airflow obstruction or restriction.  O2 sats were normal at 98%.  Chest CT scheduled for 09/24/17.  A couple of weeks ago while riding in the car had left chest pain-dull, lasted a few minutes and eased with massaging it. Has had some on the right side as well usually sitting on her sofa. Complaining of worsening dyspnea on exertion going up stairs.  She denies any exertional chest tightness or pressure, palpitations, dizziness or presyncope.  Patient is quite tearful about all she has going on at home.  Her daughter has a pituitary adenoma and her brother just had 2 heart attacks and a sister has a TIA.  In January had visual disturbance that lasted 15 min and had carotid dopplers that were stable, CT & MRI normal and referred to neurologist.    Past Medical History:  Diagnosis Date  . Asthma   . Carpal tunnel syndrome   . COPD (chronic obstructive pulmonary disease) (HCC)   . Dysrhythmia    palpitations  . GERD (gastroesophageal reflux disease)   . Graves disease   . Hyperlipidemia   . Hyperthyroidism   . Sarcoidosis   . Sleep apnea    CPAP  . Type 2 diabetes mellitus (HCC)   . Vitamin D deficiency     Past Surgical History:  Procedure Laterality Date  . CARPAL TUNNEL RELEASE Right 02/20/2015   Procedure: RIGHT CARPAL TUNNEL RELEASE;   Surgeon: Cindee Salt, MD;  Location: [facility];  Service: Orthopedics;  Laterality: Right;  ANESTHESIA:  IV REGIONAL FAB  . CARPAL TUNNEL RELEASE Left 10/09/2015   Procedure: LEFT CARPAL TUNNEL RELEASE;  Surgeon: Cindee Salt, MD;  Location: [facility];  Service: Orthopedics;  Laterality: Left;  . radioactive iodine treatment    . right knee arthroscopy    . TUBAL LIGATION    . ULNAR NERVE REPAIR    . VIDEO BRONCHOSCOPY  07/30/2012   Procedure: VIDEO BRONCHOSCOPY WITH FLUORO;  Surgeon: Storm Frisk, MD;  Location: [facility];  Service: Cardiopulmonary;  Laterality: N/A;    Current Medications: Current Meds  Medication Sig  . acetaminophen (TYLENOL) 500 MG tablet Take 500 mg by mouth every 6 (six) hours as needed for mild pain or headache.   . albuterol (PROVENTIL HFA;VENTOLIN HFA) 108 (90 Base) MCG/ACT inhaler Inhale 2 puffs into the lungs every 6 (six) hours as needed for wheezing or shortness of breath.  Marland Kitchen aspirin 81 MG tablet Take 81 mg by mouth daily.  Marland Kitchen atorvastatin (LIPITOR) 80 MG tablet Take 80 mg by mouth daily.  . Budesonide-Formoterol Fumarate (SYMBICORT IN) as directed.  . Cholecalciferol (VITAMIN D-3) 1000 UNITS CAPS Take 1 capsule by mouth daily.   . fluticasone (FLONASE) 50 MCG/ACT nasal spray Place 2 sprays into both nostrils daily as needed for allergies or  rhinitis.  . fluticasone-salmeterol (ADVAIR HFA) 230-21 MCG/ACT inhaler Inhale 2 puffs into the lungs 2 (two) times daily.  . fluticasone-salmeterol (ADVAIR HFA) 230-21 MCG/ACT inhaler Inhale 2 puffs into the lungs 2 (two) times daily.  Marland Kitchen ibuprofen (ADVIL,MOTRIN) 800 MG tablet Take 800 mg by mouth every 8 (eight) hours as needed.  Marland Kitchen ketoconazole (NIZORAL) 2 % cream Apply 1 application topically 2 (two) times daily.  . meloxicam (MOBIC) 15 MG tablet Take 15 mg by mouth daily.  . metFORMIN (GLUCOPHAGE) 500 MG tablet Take 1,000 mg by mouth every evening.   . Multiple Vitamin (MULTIVITAMIN)  tablet Take 1 tablet by mouth daily.  Marland Kitchen omeprazole (PRILOSEC) 20 MG capsule Take 20 mg by mouth daily as needed (indigestion).   . traMADol (ULTRAM) 50 MG tablet Take 50 mg by mouth every 6 (six) hours as needed.  . ValACYclovir HCl (VALTREX PO) Take by mouth.     Allergies:   Cinnamon; Sulfa antibiotics; and Robaxin [methocarbamol]   Social History   Socioeconomic History  . Marital status: Married    Spouse name: Not on file  . Number of children: 3  . Years of education: Not on file  . Highest education level: Not on file  Occupational History  . Occupation: Child psychotherapist  Social Needs  . Financial resource strain: Not on file  . Food insecurity:    Worry: Not on file    Inability: Not on file  . Transportation needs:    Medical: Not on file    Non-medical: Not on file  Tobacco Use  . Smoking status: Former Smoker    Packs/day: 0.10    Years: 10.00    Pack years: 1.00    Types: Cigarettes    Last attempt to quit: 06/30/2002    Years since quitting: 15.2  . Smokeless tobacco: Never Used  Substance and Sexual Activity  . Alcohol use: Yes    Comment: occasionally  . Drug use: No  . Sexual activity: Not on file  Lifestyle  . Physical activity:    Days per week: Not on file    Minutes per session: Not on file  . Stress: Not on file  Relationships  . Social connections:    Talks on phone: Not on file    Gets together: Not on file    Attends religious service: Not on file    Active member of club or organization: Not on file    Attends meetings of clubs or organizations: Not on file    Relationship status: Not on file  Other Topics Concern  . Not on file  Social History Narrative  . Not on file     Family History:  The patient's family history includes Asthma in her brother; Diabetes in her mother and sister; Glaucoma in her mother; Heart attack in her brother; Heart failure in her mother.   ROS:   Please see the history of present illness.    Review of  Systems  Constitution: Negative.  HENT: Negative.   Eyes: Negative.   Cardiovascular: Negative.   Respiratory: Negative.   Hematologic/Lymphatic: Negative.   Musculoskeletal: Negative.  Negative for joint pain.  Gastrointestinal: Negative.   Genitourinary: Negative.   Neurological: Negative.    All other systems reviewed and are negative.   PHYSICAL EXAM:   VS:  BP 122/78   Pulse 83   Ht 5' (1.524 m)   Wt 197 lb 12 oz (89.7 kg)   SpO2 97%   BMI 38.62  kg/m   Physical Exam  GEN: Obese, in no acute distress  Neck: Right carotid bruit no JVD or masses Cardiac: Chest tender to touch RRR; 1/6 systolic murmur at the left sternal border Respiratory:  clear to auscultation bilaterally, normal work of breathing GI: soft, nontender, nondistended, + BS Ext: without cyanosis, clubbing, or edema, Good distal pulses bilaterally Neuro:  Alert and Oriented x 3,  Psych: Tearful and anxious  Wt Readings from Last 3 Encounters:  09/22/17 197 lb 12 oz (89.7 kg)  09/11/17 197 lb 6.4 oz (89.5 kg)  07/13/17 196 lb 12.8 oz (89.3 kg)      Studies/Labs Reviewed:   EKG:  EKG is  ordered today.  The ekg ordered today demonstrates normal sinus rhythm, normal EKG  Recent Labs: No results found for requested labs within last 8760 hours.   Lipid Panel No results found for: CHOL, TRIG, HDL, CHOLHDL, VLDL, LDLCALC, LDLDIRECT  Additional studies/ records that were reviewed today include:  NST 09/21/2015  Study Highlights    The left ventricular ejection fraction is normal (55-65%).  Nuclear stress EF: 63%.  There was no ST segment deviation noted during stress.  Defect 1: There is a small defect of mild severity present in the apex location.  This is a low risk study. No ischemia identified.   Donato Schultz, MD       ASSESSMENT:    1. Chest pain of uncertain etiology   2. DOE (dyspnea on exertion)   3. OSA (obstructive sleep apnea)   4. Sarcoidosis of lung with sarcoidosis of  lymph nodes (HCC)   5. Family history of early CAD   6. Mixed hyperlipidemia      PLAN:  In order of problems listed above:  Chest pain atypical relieved with massaging her chest and tender on exam.  EKG normal, she does have multiple risk factors for CAD so we will order GXT Myoview  Dyspnea on exertion seems to have worsened.  Does have sarcoid lung and is scheduled for chest CT in 2 days.  Could be an anginal equivalent so we will also order exercise Myoview and 2D echo.  OSA on CPAP  Sarcoidosis of the lung for follow-up CT in 2 days  Family history of CAD with her brother just having 2 MIs  Mixed hyperlipidemia on Lipitor with recent lipids in February LDL 95 managed by primary care  Medication Adjustments/Labs and Tests Ordered: Current medicines are reviewed at length with the patient today.  Concerns regarding medicines are outlined above.  Medication changes, Labs and Tests ordered today are listed in the Patient Instructions below. Patient Instructions  Medication Instructions:  Your physician recommends that you continue on your current medications as directed. Please refer to the Current Medication list given to you today.   Labwork: None ordered  Testing/Procedures: Your physician has requested that you have en exercise stress myoview. For further information please visit [email or web address] Please follow instruction sheet, as given.  Your physician has requested that you have an echocardiogram. Echocardiography is a painless test that uses sound waves to create images of your heart. It provides your doctor with information about the size and shape of your heart and how well your heart's chambers and valves are working. This procedure takes approximately one hour. There are no restrictions for this procedure.    Follow-Up: Your physician recommends that you schedule a follow-up appointment in: 1 WEEK AFTER TESTING IS COMPLETE WITH Shazia Mitchener, PA-C   Any  Other Special  Instructions Will Be Listed Below (If Applicable).     If you need a refill on your cardiac medications before your next appointment, please call your pharmacy.  Echocardiogram An echocardiogram, or echocardiography, uses sound waves (ultrasound) to produce an image of your heart. The echocardiogram is simple, painless, obtained within a short period of time, and offers valuable information to your health care provider. The images from an echocardiogram can provide information such as:  Evidence of coronary artery disease (CAD).  Heart size.  Heart muscle function.  Heart valve function.  Aneurysm detection.  Evidence of a past heart attack.  Fluid buildup around the heart.  Heart muscle thickening.  Assess heart valve function.  Tell a health care provider about:  Any allergies you have.  All medicines you are taking, including vitamins, herbs, eye drops, creams, and over-the-counter medicines.  Any problems you or family members have had with anesthetic medicines.  Any blood disorders you have.  Any surgeries you have had.  Any medical conditions you have.  Whether you are pregnant or may be pregnant. What happens before the procedure? No special preparation is needed. Eat and drink normally. What happens during the procedure?  In order to produce an image of your heart, gel will be applied to your chest and a wand-like tool (transducer) will be moved over your chest. The gel will help transmit the sound waves from the transducer. The sound waves will harmlessly bounce off your heart to allow the heart images to be captured in real-time motion. These images will then be recorded.  You may need an IV to receive a medicine that improves the quality of the pictures. What happens after the procedure? You may return to your normal schedule including diet, activities, and medicines, unless your health care provider tells you otherwise. This information is  not intended to replace advice given to you by your health care provider. Make sure you discuss any questions you have with your health care provider. Document Released: 06/13/2000 Document Revised: 02/02/2016 Document Reviewed: 02/21/2013 Elsevier Interactive Patient Education  [address]2017 Elsevier Inc.     Signed, Jacolyn ReedyMichele Shaylyn Bawa, [place] JerseyPA-C  09/22/2017 2:38 PM    [facility] [address], [place], [place]  [address] Phone: [phone]; Fax: [phone]

## 2017-09-23 ENCOUNTER — Ambulatory Visit (INDEPENDENT_AMBULATORY_CARE_PROVIDER_SITE_OTHER): Admitting: Licensed Clinical Social Worker

## 2017-09-23 DIAGNOSIS — F324 Major depressive disorder, single episode, in partial remission: Secondary | ICD-10-CM | POA: Diagnosis not present

## 2017-09-24 ENCOUNTER — Ambulatory Visit (INDEPENDENT_AMBULATORY_CARE_PROVIDER_SITE_OTHER)
Admission: RE | Admit: 2017-09-24 | Discharge: 2017-09-24 | Disposition: A | Discharge status: Home / Self Care | Source: Ambulatory Visit | Attending: Adult Health | Admitting: Adult Health

## 2017-09-24 DIAGNOSIS — D869 Sarcoidosis, unspecified: Secondary | ICD-10-CM | POA: Diagnosis not present

## 2017-09-29 NOTE — Progress Notes (Signed)
Called spoke with patient, advised of CT results / recs as stated by TP.  Pt verbalized her understanding and denied any questions. 

## 2017-10-01 ENCOUNTER — Telehealth (HOSPITAL_COMMUNITY): Payer: Self-pay | Admitting: *Deleted

## 2017-10-01 NOTE — Telephone Encounter (Signed)
Patient given detailed instructions per Myocardial Perfusion Study Information Sheet for the test on 10/06/17. Patient notified to arrive 15 minutes early and that it is imperative to arrive on time for appointment to keep from having the test rescheduled.  If you need to cancel or reschedule your appointment, please call the office within 24 hours of your appointment. . Patient verbalized understanding. Slevin Gunby Jacqueline    

## 2017-10-06 ENCOUNTER — Other Ambulatory Visit: Payer: Self-pay

## 2017-10-06 ENCOUNTER — Ambulatory Visit (HOSPITAL_COMMUNITY): Attending: Cardiology

## 2017-10-06 ENCOUNTER — Ambulatory Visit (HOSPITAL_BASED_OUTPATIENT_CLINIC_OR_DEPARTMENT_OTHER)

## 2017-10-06 DIAGNOSIS — R002 Palpitations: Secondary | ICD-10-CM | POA: Diagnosis not present

## 2017-10-06 DIAGNOSIS — R0602 Shortness of breath: Secondary | ICD-10-CM | POA: Insufficient documentation

## 2017-10-06 DIAGNOSIS — E785 Hyperlipidemia, unspecified: Secondary | ICD-10-CM | POA: Insufficient documentation

## 2017-10-06 DIAGNOSIS — R079 Chest pain, unspecified: Secondary | ICD-10-CM

## 2017-10-06 DIAGNOSIS — R0609 Other forms of dyspnea: Secondary | ICD-10-CM

## 2017-10-06 DIAGNOSIS — E119 Type 2 diabetes mellitus without complications: Secondary | ICD-10-CM | POA: Diagnosis not present

## 2017-10-06 DIAGNOSIS — Z8249 Family history of ischemic heart disease and other diseases of the circulatory system: Secondary | ICD-10-CM | POA: Insufficient documentation

## 2017-10-06 DIAGNOSIS — I2510 Atherosclerotic heart disease of native coronary artery without angina pectoris: Secondary | ICD-10-CM | POA: Insufficient documentation

## 2017-10-06 DIAGNOSIS — J449 Chronic obstructive pulmonary disease, unspecified: Secondary | ICD-10-CM | POA: Diagnosis not present

## 2017-10-06 DIAGNOSIS — D869 Sarcoidosis, unspecified: Secondary | ICD-10-CM | POA: Diagnosis not present

## 2017-10-06 DIAGNOSIS — R0789 Other chest pain: Secondary | ICD-10-CM

## 2017-10-06 LAB — ECHOCARDIOGRAM COMPLETE
HEIGHTINCHES: 60 in
WEIGHTICAEL: 3152 [oz_av]

## 2017-10-06 MED ORDER — TECHNETIUM TC 99M TETROFOSMIN IV KIT
31.8000 | PACK | Freq: Once | INTRAVENOUS | Status: AC | PRN
  Administered 2017-10-06: 31.8 via INTRAVENOUS
  Filled 2017-10-06: qty 32

## 2017-10-07 ENCOUNTER — Encounter: Payer: Self-pay | Admitting: Physician Assistant

## 2017-10-09 ENCOUNTER — Ambulatory Visit (HOSPITAL_COMMUNITY): Attending: Cardiology

## 2017-10-09 LAB — MYOCARDIAL PERFUSION IMAGING
CHL CUP RESTING HR STRESS: 75 {beats}/min
CHL RATE OF PERCEIVED EXERTION: 18
CSEPED: 6 min
Estimated workload: 7.3 METS
Exercise duration (sec): 15 s
LHR: 0.32
LVDIAVOL: 71 mL (ref 46–106)
LVSYSVOL: 25 mL
MPHR: 162 {beats}/min
NUC STRESS TID: 1.01
Peak HR: 153 {beats}/min
Percent HR: 94 %
SDS: 2
SRS: 0
SSS: 2

## 2017-10-09 MED ORDER — TECHNETIUM TC 99M TETROFOSMIN IV KIT
30.1000 | PACK | Freq: Once | INTRAVENOUS | Status: AC | PRN
  Administered 2017-10-09: 30.1 via INTRAVENOUS
  Filled 2017-10-09: qty 31

## 2017-10-13 ENCOUNTER — Encounter: Payer: Self-pay | Admitting: Physician Assistant

## 2017-10-13 ENCOUNTER — Ambulatory Visit (INDEPENDENT_AMBULATORY_CARE_PROVIDER_SITE_OTHER): Admitting: Physician Assistant

## 2017-10-13 VITALS — BP 122/74 | HR 79 | Ht 60.0 in | Wt 199.0 lb

## 2017-10-13 DIAGNOSIS — R0789 Other chest pain: Secondary | ICD-10-CM

## 2017-10-13 DIAGNOSIS — Z8249 Family history of ischemic heart disease and other diseases of the circulatory system: Secondary | ICD-10-CM | POA: Diagnosis not present

## 2017-10-13 DIAGNOSIS — D862 Sarcoidosis of lung with sarcoidosis of lymph nodes: Secondary | ICD-10-CM

## 2017-10-13 DIAGNOSIS — R079 Chest pain, unspecified: Secondary | ICD-10-CM

## 2017-10-13 DIAGNOSIS — G4733 Obstructive sleep apnea (adult) (pediatric): Secondary | ICD-10-CM

## 2017-10-13 DIAGNOSIS — E782 Mixed hyperlipidemia: Secondary | ICD-10-CM

## 2017-10-13 NOTE — Patient Instructions (Signed)
Medication Instructions:  Your provider recommends that you continue on your current medications as directed. Please refer to the Current Medication list given to you today.    Labwork: None  Testing/Procedures: None  Follow-Up: Your provider recommends that you schedule a follow-up appointment AS NEEDED with Dr. Mayford Knifeurner. Give us a call if you need anything!  Any Other Special Instructions Will Be Listed Below (If Applicable).     If you need a refill on your cardiac medications before your next appointment, please call your pharmacy.

## 2017-10-13 NOTE — Progress Notes (Signed)
Cardiology Office Note    Date:  10/13/2017   ID:  Lindsey Copeland, DOB 05/01/1961, MRN [id number]  PCP:  Catha GosselinLittle, Kevin, MD  Cardiologist: Lindsey Magicraci Turner, MD  Chief Complaint  Patient presents with  . Follow-up    History of Present Illness:  Lindsey Copeland is a 57 y.o. female   with history of sarcoid lung, COPD and sleep apnea on CPAP, history of chest pain and shortness of breath, DM, HLD and remote tobacco abuse.Normal nuclear stress test 08/2015. Saw Dr. Mayford Knifeurner 09/2015 and told to f/u prn.  Saw pulmonary 09/11/17 complaining of ongoing shortness of breath despite spirometry tree in January showing stable lung function with no airflow obstruction or restriction.  O2 sats were normal at 98%.  Chest CT scheduled for 09/24/17.   I saw the patient 09/22/17 at which time she was having atypical chest pain relieved with massaging her chest and she was tender on exam.  EKG was normal.  She did have multiple risk factors for CAD so ordered a GXT Myoview which was a normal study low risk no ischemia LVEF 65%.  She was also having dyspnea on exertion which could be due to sarcoid lung.  2D echo was done and showed normal LV function with grade 1 DD.  Patient comes in today for follow-up.  I reviewed all the above tests with her in detail.  Her biggest complaint today is shortness of breath with exertion and headaches.  She has an appointment to see Dr. Vassie LollAlva.   Past Medical History:  Diagnosis Date  . Asthma   . Carpal tunnel syndrome   . COPD (chronic obstructive pulmonary disease) (HCC)   . Dysrhythmia    palpitations  . GERD (gastroesophageal reflux disease)   . Graves disease   . Hyperlipidemia   . Hyperthyroidism   . Sarcoidosis   . Sleep apnea    CPAP  . Type 2 diabetes mellitus (HCC)   . Vitamin D deficiency     Past Surgical History:  Procedure Laterality Date  . CARPAL TUNNEL RELEASE Right 02/20/2015   Procedure: RIGHT CARPAL TUNNEL RELEASE;  Surgeon: Cindee SaltGary Kuzma, MD;  Location:  [facility];  Service: Orthopedics;  Laterality: Right;  ANESTHESIA:  IV REGIONAL FAB  . CARPAL TUNNEL RELEASE Left 10/09/2015   Procedure: LEFT CARPAL TUNNEL RELEASE;  Surgeon: Cindee SaltGary Kuzma, MD;  Location: [facility];  Service: Orthopedics;  Laterality: Left;  . radioactive iodine treatment    . right knee arthroscopy    . TUBAL LIGATION    . ULNAR NERVE REPAIR    . VIDEO BRONCHOSCOPY  07/30/2012   Procedure: VIDEO BRONCHOSCOPY WITH FLUORO;  Surgeon: Storm FriskPatrick E Wright, MD;  [facility];  Service: Cardiopulmonary;  Laterality: N/A;    Current Medications: Current Meds  Medication Sig  . acetaminophen (TYLENOL) 500 MG tablet Take 500 mg by mouth every 6 (six) hours as needed for mild pain or headache.   . albuterol (PROVENTIL HFA;VENTOLIN HFA) 108 (90 Base) MCG/ACT inhaler Inhale 2 puffs into the lungs every 6 (six) hours as needed for wheezing or shortness of breath.  Marland Kitchen. aspirin 81 MG tablet Take 81 mg by mouth daily.  Marland Kitchen. atorvastatin (LIPITOR) 80 MG tablet Take 80 mg by mouth daily.  . budesonide-formoterol (SYMBICORT) 160-4.5 MCG/ACT inhaler Take 2 puffs by mouth 2 (two) times daily.   . Cholecalciferol (VITAMIN D-3) 1000 UNITS CAPS Take 1 capsule by mouth daily.   . cyclobenzaprine (FLEXERIL) 10 MG  tablet Take 1 tablet by mouth daily as needed.  . fluticasone (FLONASE) 50 MCG/ACT nasal spray Place 2 sprays into both nostrils daily as needed for allergies or rhinitis.  Marland Kitchen ibuprofen (ADVIL,MOTRIN) 800 MG tablet Take 800 mg by mouth every 8 (eight) hours as needed.  Marland Kitchen ketoconazole (NIZORAL) 2 % cream Apply 1 application topically 2 (two) times daily.  . meloxicam (MOBIC) 15 MG tablet Take 15 mg by mouth daily.  . metFORMIN (GLUCOPHAGE) 500 MG tablet Take 1,000 mg by mouth every evening.   . Multiple Vitamin (MULTIVITAMIN) tablet Take 1 tablet by mouth daily.  Marland Kitchen omeprazole (PRILOSEC) 20 MG capsule Take 20 mg by mouth daily as needed (indigestion).   .  traMADol (ULTRAM) 50 MG tablet Take 50 mg by mouth every 6 (six) hours as needed.     Allergies:   Cinnamon; Sulfa antibiotics; and Robaxin [methocarbamol]   Social History   Socioeconomic History  . Marital status: Married    Spouse name: Not on file  . Number of children: 3  . Years of education: Not on file  . Highest education level: Not on file  Occupational History  . Occupation: Child psychotherapist  Social Needs  . Financial resource strain: Not on file  . Food insecurity:    Worry: Not on file    Inability: Not on file  . Transportation needs:    Medical: Not on file    Non-medical: Not on file  Tobacco Use  . Smoking status: Former Smoker    Packs/day: 0.10    Years: 10.00    Pack years: 1.00    Types: Cigarettes    Last attempt to quit: 06/30/2002    Years since quitting: 15.2  . Smokeless tobacco: Never Used  Substance and Sexual Activity  . Alcohol use: Yes    Comment: occasionally  . Drug use: No  . Sexual activity: Not on file  Lifestyle  . Physical activity:    Days per week: Not on file    Minutes per session: Not on file  . Stress: Not on file  Relationships  . Social connections:    Talks on phone: Not on file    Gets together: Not on file    Attends religious service: Not on file    Active member of club or organization: Not on file    Attends meetings of clubs or organizations: Not on file    Relationship status: Not on file  Other Topics Concern  . Not on file  Social History Narrative  . Not on file     Family History:  The patient's family history includes Asthma in her brother; Diabetes in her mother and sister; Glaucoma in her mother; Heart attack in her brother; Heart failure in her mother.   ROS:   Please see the history of present illness.    Review of Systems  Constitution: Negative.  HENT: Negative.   Eyes: Negative.   Cardiovascular: Positive for dyspnea on exertion.  Respiratory: Negative.   Hematologic/Lymphatic: Negative.     Musculoskeletal: Negative.  Negative for joint pain.  Gastrointestinal: Negative.   Genitourinary: Negative.   Neurological: Positive for headaches.   All other systems reviewed and are negative.   PHYSICAL EXAM:   VS:  BP 122/74   Pulse 79   Ht 5' (1.524 m)   Wt 199 lb (90.3 kg)   SpO2 99%   BMI 38.86 kg/m   Physical Exam  GEN: Well nourished, well developed, in no  acute distress  Neck: no JVD, carotid bruits, or masses Cardiac:RRR; no murmurs, rubs, or gallops  Respiratory:  clear to auscultation bilaterally, normal work of breathing GI: soft, nontender, nondistended, + BS Ext: without cyanosis, clubbing, or edema, Good distal pulses bilaterally Neuro:  Alert and Oriented x 3, Psych: euthymic mood, full affect  Wt Readings from Last 3 Encounters:  10/13/17 199 lb (90.3 kg)  10/06/17 197 lb (89.4 kg)  09/22/17 197 lb 12 oz (89.7 kg)      Studies/Labs Reviewed:   EKG:  EKG is not ordered today.   Recent Labs: No results found for requested labs within last 8760 hours.   Lipid Panel No results found for: CHOL, TRIG, HDL, CHOLHDL, VLDL, LDLCALC, LDLDIRECT  Additional studies/ records that were reviewed today include:  2D echo 4/9/19Study Conclusions   - Left ventricle: The cavity size was normal. Systolic function was   normal. The estimated ejection fraction was in the range of 55%   to 60%. Wall motion was normal; there were no regional wall   motion abnormalities. Doppler parameters are consistent with   abnormal left ventricular relaxation (grade 1 diastolic   dysfunction).   NST 4/12/19Study Highlights      Nuclear stress EF: 65%.  The left ventricular ejection fraction is normal (55-65%).  Blood pressure demonstrated a normal response to exercise.  There was no ST segment deviation noted during stress.  Defect 1: There is a small defect of moderate severity present in the apex location.  The study is normal.  This is a low risk study.    Normal stress nuclear study with apical thinning but no ischemia; EF 65 with normal wall motion.          ASSESSMENT:    1. Chest pain of uncertain etiology   2. Sarcoidosis of lung with sarcoidosis of lymph nodes (HCC)   3. Mixed hyperlipidemia   4. OSA (obstructive sleep apnea)   5. Family history of early CAD      PLAN:  In order of problems listed above:  Chest pain atypical with normal nuclear stress test 10/09/17 LVEF 65% no ischemia. Echo with normal LV function with grade 1 DD. F/U with Dr. Mayford Knife prn.  Sarcoid lung with recent CT consistent with this.  Follow-up with Dr.Alva who also manages her sleep apnea  Mixed hyperlipidemia on Lipitor LDL 95 08/21/17 managed by PCP goal should be less than 70 with family history of CAD.  OSA on CPAP managed by Dr. Pernell Dupre  Family history of early CAD with brother having 2 MI's    Medication Adjustments/Labs and Tests Ordered: Current medicines are reviewed at length with the patient today.  Concerns regarding medicines are outlined above.  Medication changes, Labs and Tests ordered today are listed in the Patient Instructions below. Patient Instructions  Medication Instructions:  Your provider recommends that you continue on your current medications as directed. Please refer to the Current Medication list given to you today.    Labwork: None  Testing/Procedures: None  Follow-Up: Your provider recommends that you schedule a follow-up appointment AS NEEDED with Dr. Mayford Knife. Give [place] a call if you need anything!  Any Other Special Instructions Will Be Listed Below (If Applicable).     If you need a refill on your cardiac medications before your next appointment, please call your pharmacy.      Elson Clan, PA-C  10/13/2017 11:32 AM    [facility] [address] [place],  [place], [place]  [address] Phone: [phone]; Fax: [phone]

## 2017-11-11 ENCOUNTER — Encounter

## 2017-11-11 ENCOUNTER — Ambulatory Visit: Payer: Self-pay | Admitting: Cardiology

## 2017-11-12 ENCOUNTER — Ambulatory Visit (INDEPENDENT_AMBULATORY_CARE_PROVIDER_SITE_OTHER): Admitting: Neurology

## 2017-11-12 ENCOUNTER — Encounter: Payer: Self-pay | Admitting: Neurology

## 2017-11-12 VITALS — BP 110/68 | HR 76 | Ht 60.0 in | Wt 198.0 lb

## 2017-11-12 DIAGNOSIS — G43709 Chronic migraine without aura, not intractable, without status migrainosus: Secondary | ICD-10-CM

## 2017-11-12 DIAGNOSIS — IMO0002 Reserved for concepts with insufficient information to code with codable children: Secondary | ICD-10-CM

## 2017-11-12 MED ORDER — VENLAFAXINE HCL ER 37.5 MG PO CP24: 75.0000 mg | ORAL_CAPSULE | Freq: Every day | ORAL | 11 refills | Status: DC

## 2017-11-12 MED ORDER — SUMATRIPTAN SUCCINATE 50 MG PO TABS: 50.0000 mg | ORAL_TABLET | ORAL | 0 refills | Status: DC | PRN

## 2017-11-12 NOTE — Progress Notes (Signed)
PATIENT: Lindsey Copeland DOB: 1961/03/19  Chief Complaint  Patient presents with  . New Patient (Initial Visit)    PCP: Dr. [name]. Patient is alone. Patient had an episode of partial vision loss in her right eye. She is now very light sensitive and has frequent headaches.      HISTORICAL  Alveda Vanhorne is a 57 year old female, seen in refer by her primary care Dr. [name] for evaluation of headaches, with visual disturbance, initial evaluation was on Nov 12, 2017.  I have reviewed and summarized the referring note, she has past medical history of hyperlipidemia, diabetes, Graves' disease with hyperthyroidism, status post radioactive iodine ablation 2013, now with subclinical hyperthyroidism, followed by endocrinologist Dr. Sharl Ma, keloid, vitamin D deficiency, bilateral hilar area adenopathy, was diagnosed with sarcoidosis, followed by pulmonologist, sleep apnea using CPAP machine, shingles in the past, she is tearful during today's interview, reported all of stress over the past few years, she denies a previous history of headache.  But since January 2019, she began to have frequent headaches, on July 21, 2017, she already had 3 days of moderate to severe headaches, has been taking multiple daily dose of Tylenol mixed with Advil, while she was eating breakfast that morning, she had sudden onset right side blurry vision, she was able to cover left worse his right eye, noticed it was right monocular abnormal vision, she could see right eye upper visual field, but to right lower visual field was blurry, whitish bright, she has to wear sunglasses at her home, the bright light was overwhelming at her house, the visual abnormality last about 15 minutes, gradually disappeared, she had worsening headaches  She had almost daily headaches to variable degree since January until early May 2019, she already takes Advil and Tylenol for her joint pain, now she is on higher dose,  She had  extensive evaluations:  MRI of the brain with without contrast on September 05, 2017 was normal Echocardiogram showed normal ejection fraction, there was no significant wall motion abnormality. Ultrasound of carotid artery on August 26, 2017 showed minimal amount of bilateral atherosclerotic plaque, no significant stenosis,  CT head without contrast July 21, 2017 was normal  Laboratory evaluations in February 2019, showed A1c of 7.4, LDL of 95, normal TSH, CMP, with glucose of 124, CBC hemoglobin of 12.5,  REVIEW OF SYSTEMS: Full 14 system review of systems performed and notable only for fatigue, chest pain, trouble swallowing, rash, itching, moles, blurred vision, eye pain, shortness of breath, cough, snoring, feeling hot, feeling cold, joint pain, joint swelling, allergy, frequent infections, headaches, numbness, weakness, difficulty swallowing, insomnia, snoring, depression, anxiety, not enough sleep, decreased energy, disinterested in activities  ALLERGIES: Allergies  Allergen Reactions  . Cinnamon Shortness Of Breath  . Sulfa Antibiotics     hives  . Indomethacin Palpitations  . Robaxin [Methocarbamol] Palpitations    HOME MEDICATIONS: Current Outpatient Medications  Medication Sig Dispense Refill  . acetaminophen (TYLENOL) 500 MG tablet Take 500 mg by mouth every 6 (six) hours as needed for mild pain or headache.     . albuterol (PROVENTIL HFA;VENTOLIN HFA) 108 (90 Base) MCG/ACT inhaler Inhale 2 puffs into the lungs every 6 (six) hours as needed for wheezing or shortness of breath. 1 Inhaler 3  . aspirin 81 MG tablet Take 81 mg by mouth daily.    Marland Kitchen atorvastatin (LIPITOR) 80 MG tablet Take 80 mg by mouth daily.    . budesonide-formoterol (SYMBICORT) 160-4.5 MCG/ACT inhaler Take 2  puffs by mouth 2 (two) times daily.     . Cholecalciferol (VITAMIN D-3) 1000 UNITS CAPS Take 1 capsule by mouth daily.     . cyclobenzaprine (FLEXERIL) 10 MG tablet Take 1 tablet by mouth daily as needed.     . fluticasone (FLONASE) 50 MCG/ACT nasal spray Place 2 sprays into both nostrils daily as needed for allergies or rhinitis. 48 g 3  . ibuprofen (ADVIL,MOTRIN) 800 MG tablet Take 800 mg by mouth every 8 (eight) hours as needed.    Marland Kitchen ketoconazole (NIZORAL) 2 % cream Apply 1 application topically 2 (two) times daily.    . meloxicam (MOBIC) 15 MG tablet Take 15 mg by mouth daily.    . metFORMIN (GLUCOPHAGE) 500 MG tablet Take 1,000 mg by mouth every evening.     . Multiple Vitamin (MULTIVITAMIN) tablet Take 1 tablet by mouth daily.    Marland Kitchen omeprazole (PRILOSEC) 20 MG capsule Take 20 mg by mouth daily as needed (indigestion).     . traMADol (ULTRAM) 50 MG tablet Take 50 mg by mouth every 6 (six) hours as needed.     No current facility-administered medications for this visit.     PAST MEDICAL HISTORY: Past Medical History:  Diagnosis Date  . Asthma   . Carpal tunnel syndrome   . COPD (chronic obstructive pulmonary disease) (HCC)   . Dysrhythmia    palpitations  . GERD (gastroesophageal reflux disease)   . Graves disease   . Hyperlipidemia   . Hyperthyroidism   . Sarcoidosis   . Sleep apnea    CPAP  . Type 2 diabetes mellitus (HCC)   . Vitamin D deficiency     PAST SURGICAL HISTORY: Past Surgical History:  Procedure Laterality Date  . CARPAL TUNNEL RELEASE Right 02/20/2015   Procedure: RIGHT CARPAL TUNNEL RELEASE;  Surgeon: Cindee Salt, MD;  Location: [facility];  Service: Orthopedics;  Laterality: Right;  ANESTHESIA:  IV REGIONAL FAB  . CARPAL TUNNEL RELEASE Left 10/09/2015   Procedure: LEFT CARPAL TUNNEL RELEASE;  Surgeon: Cindee Salt, MD;  Location: [facility];  Service: Orthopedics;  Laterality: Left;  . radioactive iodine treatment    . right knee arthroscopy    . TUBAL LIGATION    . ULNAR NERVE REPAIR    . VIDEO BRONCHOSCOPY  07/30/2012   Procedure: VIDEO BRONCHOSCOPY WITH FLUORO;  Surgeon: Storm Frisk, MD;  Location: [facility];  Service:  Cardiopulmonary;  Laterality: N/A;    FAMILY HISTORY: Family History  Problem Relation Age of Onset  . Asthma Brother   . Diabetes Mother   . Heart failure Mother   . Glaucoma Mother   . Heart attack Brother   . Diabetes Sister     SOCIAL HISTORY:  Social History   Socioeconomic History  . Marital status: Married    Spouse name: Not on file  . Number of children: 3  . Years of education: Not on file  . Highest education level: Not on file  Occupational History  . Occupation: Child psychotherapist  Social Needs  . Financial resource strain: Not on file  . Food insecurity:    Worry: Not on file    Inability: Not on file  . Transportation needs:    Medical: Not on file    Non-medical: Not on file  Tobacco Use  . Smoking status: Former Smoker    Packs/day: 0.10    Years: 10.00    Pack years: 1.00    Types: Cigarettes  Last attempt to quit: 06/30/2002    Years since quitting: 15.3  . Smokeless tobacco: Never Used  Substance and Sexual Activity  . Alcohol use: Yes    Comment: occasionally  . Drug use: No  . Sexual activity: Not on file  Lifestyle  . Physical activity:    Days per week: Not on file    Minutes per session: Not on file  . Stress: Not on file  Relationships  . Social connections:    Talks on phone: Not on file    Gets together: Not on file    Attends religious service: Not on file    Active member of club or organization: Not on file    Attends meetings of clubs or organizations: Not on file    Relationship status: Not on file  . Intimate partner violence:    Fear of current or ex partner: Not on file    Emotionally abused: Not on file    Physically abused: Not on file    Forced sexual activity: Not on file  Other Topics Concern  . Not on file  Social History Narrative  . Not on file     PHYSICAL EXAM   Vitals:   11/12/17 0824  BP: 110/68  Pulse: 76  Weight: 198 lb (89.8 kg)  Height: 5' (1.524 m)    Not recorded      Body mass index  is 38.67 kg/m.  PHYSICAL EXAMNIATION:  Gen: NAD, conversant, well nourised, obese, well groomed                     Cardiovascular: Regular rate rhythm, no peripheral edema, warm, nontender. Eyes: Conjunctivae clear without exudates or hemorrhage Neck: Supple, no carotid bruits. Pulmonary: Clear to auscultation bilaterally   NEUROLOGICAL EXAM:  MENTAL STATUS: Speech:    Speech is normal; fluent and spontaneous with normal comprehension.  Cognition:     Orientation to time, place and person     Normal recent and remote memory     Normal Attention span and concentration     Normal Language, naming, repeating,spontaneous speech     Fund of knowledge   CRANIAL NERVES: CN II: Visual fields are full to confrontation. Fundoscopic exam is normal with sharp discs and no vascular changes. Pupils are round equal and briskly reactive to light. CN III, IV, VI: extraocular movement are normal. No ptosis. CN V: Facial sensation is intact to pinprick in all 3 divisions bilaterally. Corneal responses are intact.  CN VII: Face is symmetric with normal eye closure and smile. CN VIII: Hearing is normal to rubbing fingers CN IX, X: Palate elevates symmetrically. Phonation is normal. CN XI: Head turning and shoulder shrug are intact CN XII: Tongue is midline with normal movements and no atrophy.  MOTOR: There is no pronator drift of out-stretched arms. Muscle bulk and tone are normal. Muscle strength is normal.  REFLEXES: Reflexes are 2+ and symmetric at the biceps, triceps, knees, and ankles. Plantar responses are flexor.  SENSORY: Intact to light touch, pinprick, positional sensation and vibratory sensation are intact in fingers and toes.  COORDINATION: Rapid alternating movements and fine finger movements are intact. There is no dysmetria on finger-to-nose and heel-knee-shin.    GAIT/STANCE: Posture is normal. Gait is steady with normal steps, base, arm swing, and turning. Heel and toe  walking are normal. Tandem gait is normal.  Romberg is absent.   DIAGNOSTIC DATA (LABS, IMAGING, TESTING) - I reviewed patient records, labs, notes,  testing and imaging myself where available.   ASSESSMENT AND PLAN  Persephanie Laatsch is a 57 y.o. female   Worsening headache with right visual field change  Most consistent with migraine variant  Start preventive medication Effexor XR 37.5 mg, titrating to 2 tablets every night, Imitrex 50 mg as needed     Levert Feinstein, M.D. Ph.D.  [facility] [address] [place], [place] [address] Ph: [phone] Fax: [phone]  CC: [name], MD

## 2017-11-20 ENCOUNTER — Other Ambulatory Visit: Payer: Self-pay | Admitting: Family Medicine

## 2017-11-20 DIAGNOSIS — Z1231 Encounter for screening mammogram for malignant neoplasm of breast: Secondary | ICD-10-CM

## 2017-12-04 ENCOUNTER — Telehealth: Payer: Self-pay | Admitting: Pulmonary Disease

## 2017-12-04 MED ORDER — BUDESONIDE-FORMOTEROL FUMARATE 160-4.5 MCG/ACT IN AERO: 2.0000 | INHALATION_SPRAY | Freq: Two times a day (BID) | RESPIRATORY_TRACT | 0 refills | Status: DC

## 2017-12-04 NOTE — Telephone Encounter (Signed)
Symbicort 160 sample placed up at front desk in drawer.  Patient notified. Nothing further needed.

## 2017-12-11 ENCOUNTER — Ambulatory Visit
Admission: RE | Admit: 2017-12-11 | Discharge: 2017-12-11 | Disposition: A | Discharge status: Home / Self Care | Source: Ambulatory Visit | Attending: Family Medicine | Admitting: Family Medicine

## 2017-12-11 DIAGNOSIS — Z1231 Encounter for screening mammogram for malignant neoplasm of breast: Secondary | ICD-10-CM

## 2017-12-16 ENCOUNTER — Ambulatory Visit: Admitting: Licensed Clinical Social Worker

## 2017-12-18 ENCOUNTER — Encounter: Payer: Self-pay | Admitting: Pulmonary Disease

## 2017-12-18 ENCOUNTER — Ambulatory Visit (INDEPENDENT_AMBULATORY_CARE_PROVIDER_SITE_OTHER): Admitting: Pulmonary Disease

## 2017-12-18 VITALS — BP 120/78 | HR 105 | Ht 60.0 in | Wt 197.6 lb

## 2017-12-18 DIAGNOSIS — D862 Sarcoidosis of lung with sarcoidosis of lymph nodes: Secondary | ICD-10-CM

## 2017-12-18 DIAGNOSIS — R1313 Dysphagia, pharyngeal phase: Secondary | ICD-10-CM | POA: Diagnosis not present

## 2017-12-18 DIAGNOSIS — R1310 Dysphagia, unspecified: Secondary | ICD-10-CM | POA: Diagnosis not present

## 2017-12-18 DIAGNOSIS — G4733 Obstructive sleep apnea (adult) (pediatric): Secondary | ICD-10-CM | POA: Diagnosis not present

## 2017-12-18 NOTE — Assessment & Plan Note (Signed)
Appears stable, no evidence of flareup Lung function is stable no evidence of extra pulmonary involvement at this time

## 2017-12-18 NOTE — Progress Notes (Signed)
   Subjective:    Patient ID: Lindsey Copeland, female    DOB: 04/11/1961, 57 y.o.   MRN: [id number]  HPI  57 yo female former smoker followed for Sarcoid and moderate OSA  Accompanied by her husband, history of dyspnea last 3 months episode of temporary vision loss. , had normal CT head, MRI and Carotid US , underwent neurological evaluation diagnosed as visual migraine,.. Started on Effexor  She reports left-sided chest pain not related to exertion can occur even while sitting, nonradiating, ongoing for 2 months, underwent cardiology evaluation, stress Myoview was low risk for ischemia, echo was normal  She also reports difficulty swallowing, especially to solids more than liquids  Compliant with CPAP, no mask or pressure issues   Significant tests/ events  01/2013 PSG -wt 190 pounds, BMI 34 showed predominant hypopneas during supine sleep and with AHI of 16 events per hour. Nonsupine AHI was only 3 events per hour.   PFTs 07/2012 nml lung volumes, no obs, DLCO 71% PFT 10/2014 >>ratio 80, FVC 104%, FEV1 108%, DLCO 83%-normal 06/2017 spiro >> nml, FVC 92%   CT chest 05/2013 Mediastinal/bilateral hilar lymphadenopathy, Peribronchovascular nodularity throughout all lobes, numerous lesions in the spleen. HRCT 08/2017 >> decreased lymphadenopathy, mild 1 to 2 mm nodules along fissures consistent with sarcoidosis  Echo 09/2017 grade 1 DD  Review of Systems neg for any significant sore throat, dysphagia, itching, sneezing, nasal congestion or excess/ purulent secretions, fever, chills, sweats, unintended wt loss, pleuritic or exertional cp, hempoptysis, orthopnea pnd or change in chronic leg swelling. Also denies presyncope, palpitations, heartburn, abdominal pain, nausea, vomiting, diarrhea or change in bowel or urinary habits, dysuria,hematuria, rash, arthralgias, visual complaints, headache, numbness weakness or ataxia.     Objective:   Physical Exam  Gen. Pleasant, obese, in no  distress ENT - no lesions, no post nasal drip Neck: No JVD, no thyromegaly, no carotid bruits Lungs: no use of accessory muscles, no dullness to percussion, decreased without rales or rhonchi  Cardiovascular: Rhythm regular, heart sounds  normal, no murmurs or gallops, no peripheral edema, reproducible tenderness over left second cc Musculoskeletal: No deformities, no cyanosis or clubbing , no tremors        Assessment & Plan:

## 2017-12-18 NOTE — Addendum Note (Signed)
Addended by: Cydney OkAUGUSTIN, Bethanee Redondo N on: 12/18/2017 11:14 AM   Modules accepted: Orders

## 2017-12-18 NOTE — Assessment & Plan Note (Signed)
Weight loss encouraged, compliance with goal of at least 4-6 hrs every night is the expectation. Advised against medications with sedative side effects Cautioned against driving when sleepy - understanding that sleepiness will vary on a day to day basis  

## 2017-12-18 NOTE — Assessment & Plan Note (Signed)
Schedule barium swallow

## 2017-12-18 NOTE — Patient Instructions (Signed)
Sarcoidosis appears stable. Swallowing test will be scheduled

## 2017-12-22 ENCOUNTER — Ambulatory Visit (INDEPENDENT_AMBULATORY_CARE_PROVIDER_SITE_OTHER): Admitting: Licensed Clinical Social Worker

## 2017-12-22 DIAGNOSIS — F3341 Major depressive disorder, recurrent, in partial remission: Secondary | ICD-10-CM

## 2017-12-25 ENCOUNTER — Other Ambulatory Visit (HOSPITAL_COMMUNITY): Payer: Self-pay | Admitting: Pulmonary Disease

## 2017-12-25 DIAGNOSIS — R1310 Dysphagia, unspecified: Secondary | ICD-10-CM

## 2018-01-11 ENCOUNTER — Ambulatory Visit: Payer: Self-pay | Admitting: Adult Health

## 2018-01-13 ENCOUNTER — Ambulatory Visit (HOSPITAL_COMMUNITY)
Admission: RE | Admit: 2018-01-13 | Discharge: 2018-01-13 | Disposition: A | Discharge status: Home / Self Care | Source: Ambulatory Visit | Attending: Pulmonary Disease | Admitting: Pulmonary Disease

## 2018-01-13 DIAGNOSIS — K224 Dyskinesia of esophagus: Secondary | ICD-10-CM | POA: Insufficient documentation

## 2018-01-13 DIAGNOSIS — R1310 Dysphagia, unspecified: Secondary | ICD-10-CM | POA: Insufficient documentation

## 2018-01-13 DIAGNOSIS — K219 Gastro-esophageal reflux disease without esophagitis: Secondary | ICD-10-CM | POA: Insufficient documentation

## 2018-01-13 DIAGNOSIS — R1313 Dysphagia, pharyngeal phase: Secondary | ICD-10-CM

## 2018-01-13 DIAGNOSIS — K449 Diaphragmatic hernia without obstruction or gangrene: Secondary | ICD-10-CM | POA: Diagnosis not present

## 2018-01-15 ENCOUNTER — Encounter: Payer: Self-pay | Admitting: Internal Medicine

## 2018-01-15 ENCOUNTER — Other Ambulatory Visit: Payer: Self-pay

## 2018-01-15 DIAGNOSIS — K219 Gastro-esophageal reflux disease without esophagitis: Secondary | ICD-10-CM

## 2018-01-21 ENCOUNTER — Ambulatory Visit: Admitting: Licensed Clinical Social Worker

## 2018-02-22 ENCOUNTER — Ambulatory Visit (INDEPENDENT_AMBULATORY_CARE_PROVIDER_SITE_OTHER): Admitting: Licensed Clinical Social Worker

## 2018-02-22 DIAGNOSIS — F3341 Major depressive disorder, recurrent, in partial remission: Secondary | ICD-10-CM

## 2018-03-02 NOTE — Progress Notes (Signed)
[facility]  PATIENT: Lindsey Copeland DOB: 04-11-1961   REASON FOR VISIT: Follow-up for migraine HISTORY FROM: Patient    HISTORY OF PRESENT ILLNESS: 5/16/19YYJanie Copeland is a 57 year old female, seen in refer by her primary care Dr. [name] for evaluation of headaches, with visual disturbance, initial evaluation was on Nov 12, 2017. I have reviewed and summarized the referring note, she has past medical history of hyperlipidemia, diabetes, Graves' disease with hyperthyroidism, status post radioactive iodine ablation 2013, now with subclinical hyperthyroidism, followed by endocrinologist Dr. Sharl Ma, keloid, vitamin D deficiency, bilateral hilar area adenopathy, was diagnosed with sarcoidosis, followed by pulmonologist, sleep apnea using CPAP machine, shingles in the past, she is tearful during today's interview, reported all of stress over the past few years, she denies a previous history of headache. But since January 2019, she began to have frequent headaches, on July 21, 2017, she already had 3 days of moderate to severe headaches, has been taking multiple daily dose of Tylenol mixed with Advil, while she was eating breakfast that morning, she had sudden onset right side blurry vision, she was able to cover left worse his right eye, noticed it was right monocular abnormal vision, she could see right eye upper visual field, but to right lower visual field was blurry, whitish bright, she has to wear sunglasses at her home, the bright light was overwhelming at her house, the visual abnormality last about 15 minutes, gradually disappeared, she had worsening headaches She had almost daily headaches to variable degree since January until early May 2019, she already takes Advil and Tylenol for her joint pain, now she is on higher dose, She had extensive evaluations:  MRI of the brain with without contrast on September 05, 2017 was normal Echocardiogram showed normal  ejection fraction, there was no significant wall motion abnormality. Ultrasound of carotid artery on August 26, 2017 showed minimal amount of bilateral atherosclerotic plaque, no significant stenosis, CT head without contrast July 21, 2017 was normal Laboratory evaluations in February 2019, showed A1c of 7.4, LDL of 95, normal TSH, CMP, with glucose of 124, CBC hemoglobin of 12.5, UPDATE 9/4/2019[name], 57 year old female returns for follow-up with history of migraine.  She was last seen by Dr. Terrace Arabia in May and placed on Effexor which has been helpful.  She no longer has headaches with visual symptoms.  She continues to take quite a bit of Tylenol and Advil and was made aware of rebound headaches.  She has a history of obstructive sleep apnea and uses her CPAP every night.  She is not aware of any foods that cause headaches.  She had questions about her most recent MRI which was normal.  She has not had a headache more severe than  5 on a scale of 1-10 since last seen.  Instead of taking Imitrex sometimes she just lays down in a quiet room.  She is going to counseling.  She gets little exercise and was encouraged to do so.  She returns for reevaluation REVIEW OF SYSTEMS: Full 14 system review of systems performed and notable only for those listed, all others are neg:  Constitutional: Fatigue Cardiovascular: neg Ear/Nose/Throat: neg  Skin: neg Eyes: neg Respiratory: neg Gastroitestinal: neg  Hematology/Lymphatic: neg  Endocrine: Intolerance to heat Musculoskeletal: Joint pain back pain Allergy/Immunology: neg Neurological: Headaches Psychiatric: Anxiety Sleep : Obstructive sleep apnea with CPAP   ALLERGIES: Allergies  Allergen Reactions  . Cinnamon Shortness Of Breath  . Sulfa Antibiotics  hives  . Indomethacin Palpitations  . Robaxin [Methocarbamol] Palpitations    HOME MEDICATIONS: Outpatient Medications Prior to Visit  Medication Sig Dispense Refill  .  acetaminophen (TYLENOL) 500 MG tablet Take 500 mg by mouth every 6 (six) hours as needed for mild pain or headache.     . albuterol (PROVENTIL HFA;VENTOLIN HFA) 108 (90 Base) MCG/ACT inhaler Inhale 2 puffs into the lungs every 6 (six) hours as needed for wheezing or shortness of breath. 1 Inhaler 3  . aspirin 81 MG tablet Take 81 mg by mouth daily.    Marland Kitchen atorvastatin (LIPITOR) 80 MG tablet Take 80 mg by mouth daily.    . budesonide-formoterol (SYMBICORT) 160-4.5 MCG/ACT inhaler Take 2 puffs by mouth 2 (two) times daily.     . Cholecalciferol (VITAMIN D-3) 1000 UNITS CAPS Take 1 capsule by mouth daily.     . cyclobenzaprine (FLEXERIL) 10 MG tablet Take 1 tablet by mouth daily as needed.    . fluticasone (FLONASE) 50 MCG/ACT nasal spray Place 2 sprays into both nostrils daily as needed for allergies or rhinitis. 48 g 3  . ibuprofen (ADVIL,MOTRIN) 800 MG tablet Take 800 mg by mouth every 8 (eight) hours as needed.    Marland Kitchen ketoconazole (NIZORAL) 2 % cream Apply 1 application topically 2 (two) times daily.    . meloxicam (MOBIC) 15 MG tablet Take 15 mg by mouth daily.    . metFORMIN (GLUCOPHAGE) 500 MG tablet Take 1,000 mg by mouth every evening.     . Multiple Vitamin (MULTIVITAMIN) tablet Take 1 tablet by mouth daily.    Marland Kitchen omeprazole (PRILOSEC) 20 MG capsule Take 20 mg by mouth daily as needed (indigestion).     . SUMAtriptan (IMITREX) 50 MG tablet Take 1 tablet (50 mg total) by mouth every 2 (two) hours as needed for migraine. May repeat in 2 hours if headache persists or recurs. 10 tablet 0  . traMADol (ULTRAM) 50 MG tablet Take 50 mg by mouth every 6 (six) hours as needed.    . venlafaxine XR (EFFEXOR XR) 37.5 MG 24 hr capsule Take 2 capsules (75 mg total) by mouth daily with breakfast. 60 capsule 11   No facility-administered medications prior to visit.     PAST MEDICAL HISTORY: Past Medical History:  Diagnosis Date  . Asthma   . Carpal tunnel syndrome   . COPD (chronic obstructive pulmonary  disease) (HCC)   . Dysrhythmia    palpitations  . GERD (gastroesophageal reflux disease)   . Graves disease   . Hyperlipidemia   . Hyperthyroidism   . Sarcoidosis   . Sleep apnea    CPAP  . Type 2 diabetes mellitus (HCC)   . Vitamin D deficiency     PAST SURGICAL HISTORY: Past Surgical History:  Procedure Laterality Date  . CARPAL TUNNEL RELEASE Right 02/20/2015   Procedure: RIGHT CARPAL TUNNEL RELEASE;  Surgeon: Cindee Salt, MD;  Location: [facility];  Service: Orthopedics;  Laterality: Right;  ANESTHESIA:  IV REGIONAL FAB  . CARPAL TUNNEL RELEASE Left 10/09/2015   Procedure: LEFT CARPAL TUNNEL RELEASE;  Surgeon: Cindee Salt, MD;  Location: [facility];  Service: Orthopedics;  Laterality: Left;  . radioactive iodine treatment    . right knee arthroscopy    . TUBAL LIGATION    . ULNAR NERVE REPAIR    . VIDEO BRONCHOSCOPY  07/30/2012   Procedure: VIDEO BRONCHOSCOPY WITH FLUORO;  Surgeon: Storm Frisk, MD;  Location: [facility];  Service:  Cardiopulmonary;  Laterality: N/A;    FAMILY HISTORY: Family History  Problem Relation Age of Onset  . Asthma Brother   . Diabetes Mother   . Heart failure Mother   . Glaucoma Mother   . Heart attack Brother   . Diabetes Sister   . Breast cancer Neg Hx     SOCIAL HISTORY: Social History   Socioeconomic History  . Marital status: Married    Spouse name: Not on file  . Number of children: 3  . Years of education: Not on file  . Highest education level: Not on file  Occupational History  . Occupation: Child psychotherapist  Social Needs  . Financial resource strain: Not on file  . Food insecurity:    Worry: Not on file    Inability: Not on file  . Transportation needs:    Medical: Not on file    Non-medical: Not on file  Tobacco Use  . Smoking status: Former Smoker    Packs/day: 0.10    Years: 10.00    Pack years: 1.00    Types: Cigarettes    Last attempt to quit: 06/30/2002    Years since quitting:  15.6  . Smokeless tobacco: Never Used  Substance and Sexual Activity  . Alcohol use: Yes    Comment: occasionally  . Drug use: No  . Sexual activity: Not on file  Lifestyle  . Physical activity:    Days per week: Not on file    Minutes per session: Not on file  . Stress: Not on file  Relationships  . Social connections:    Talks on phone: Not on file    Gets together: Not on file    Attends religious service: Not on file    Active member of club or organization: Not on file    Attends meetings of clubs or organizations: Not on file    Relationship status: Not on file  . Intimate partner violence:    Fear of current or ex partner: Not on file    Emotionally abused: Not on file    Physically abused: Not on file    Forced sexual activity: Not on file  Other Topics Concern  . Not on file  Social History Narrative  . Not on file     PHYSICAL EXAM  Vitals:   03/03/18 0926  BP: 113/70  Pulse: 76  Weight: 194 lb (88 kg)  Height: 5' (1.524 m)   Body mass index is 37.89 kg/m.  Generalized: Well developed, obese female in no acute distress , well-groomed Head: normocephalic and atraumatic,. Oropharynx benign  Neck: Supple,  Musculoskeletal: No deformity   Neurological examination   Mentation: Alert oriented to time, place, history taking. Attention span and concentration appropriate. Recent and remote memory intact.  Follows all commands speech and language fluent.   Cranial nerve II-XII: .Pupils were equal round reactive to light extraocular movements were full, visual field were full on confrontational test. Facial sensation and strength were normal. hearing was intact to finger rubbing bilaterally. Uvula tongue midline. head turning and shoulder shrug were normal and symmetric.Tongue protrusion into cheek strength was normal. Motor: normal bulk and tone, full strength in the BUE, BLE, fine finger movements normal, no pronator drift. No focal weakness Sensory: normal and  symmetric to light touch, pinprick, and  Vibration, in the upper and lower extremities Coordination: finger-nose-finger, heel-to-shin bilaterally, no dysmetria Reflexes: Brachioradialis 2/2, biceps 2/2, triceps 2/2, patellar 2/2, Achilles 2/2, plantar responses were flexor bilaterally. Gait  and Station: Rising up from seated position without assistance, normal stance,  moderate stride, good arm swing, smooth turning, able to perform tiptoe, and heel walking without difficulty. Tandem gait is steady  DIAGNOSTIC DATA (LABS, IMAGING, TESTING) - I reviewed patient records, labs, notes, testing and imaging myself where available.  Lab Results  Component Value Date   HGB 13.6 10/09/2015   HCT 40.0 10/09/2015      Component Value Date/Time   NA 141 10/09/2015 0730   K 3.7 10/09/2015 0730   CL 105 10/09/2015 0730   CO2 29 02/19/2015 0930   GLUCOSE 93 10/09/2015 0730   BUN 17 10/09/2015 0730   CREATININE 0.60 10/09/2015 0730   CALCIUM 9.4 02/19/2015 0930   PROT 8.2 07/23/2012 1713   ALBUMIN 4.2 07/23/2012 1713   AST 18 07/23/2012 1713   ALT 17 07/23/2012 1713   ALKPHOS 86 07/23/2012 1713   BILITOT 0.4 07/23/2012 1713   GFRNONAA >60 02/19/2015 0930   GFRAA >60 02/19/2015 0930   ASSESSMENT AND PLAN   Lindsey Copeland is a 57 y.o. female   with a history of worsening headaches with right visual field change most consistent with a migraine variant. ,  PLAN: Continue Effexor at current dose Continue counseling Recommend exercise at least 30 minutes every day Continue Imitrex acutely as needed Given information on rebound headache Given information on common foods that are migraine triggers Reviewed MRI of the brain with patient F/U in 6 months I spent 25 minutes in total face to face time with the patient more than 50% of which was spent counseling and coordination of care, reviewing test results reviewing medications and discussing and reviewing the diagnosis of migraine and further  treatment options. See above plan . , Nilda Riggs [facility], [facility], APRN  [facility] [address] [place], [place] [address] [phone]

## 2018-03-03 ENCOUNTER — Ambulatory Visit (INDEPENDENT_AMBULATORY_CARE_PROVIDER_SITE_OTHER): Admitting: Nurse Practitioner

## 2018-03-03 ENCOUNTER — Encounter: Payer: Self-pay | Admitting: Nurse Practitioner

## 2018-03-03 DIAGNOSIS — G43909 Migraine, unspecified, not intractable, without status migrainosus: Secondary | ICD-10-CM | POA: Diagnosis not present

## 2018-03-03 NOTE — Patient Instructions (Signed)
Continue Effexor at current dose Continue counseling Recommend exercise at least 30 minutes every day Continue Imitrex acutely as needed Given information on rebound headache Given information on common foods that are migraine triggers F/U in 6 months

## 2018-03-08 NOTE — Progress Notes (Signed)
I have reviewed and agreed above plan. 

## 2018-03-15 ENCOUNTER — Other Ambulatory Visit

## 2018-03-15 ENCOUNTER — Ambulatory Visit (INDEPENDENT_AMBULATORY_CARE_PROVIDER_SITE_OTHER): Admitting: Adult Health

## 2018-03-15 ENCOUNTER — Encounter: Payer: Self-pay | Admitting: Adult Health

## 2018-03-15 VITALS — BP 126/72 | HR 83 | Ht 60.0 in | Wt 193.6 lb

## 2018-03-15 DIAGNOSIS — D869 Sarcoidosis, unspecified: Secondary | ICD-10-CM

## 2018-03-15 DIAGNOSIS — R0602 Shortness of breath: Secondary | ICD-10-CM

## 2018-03-15 DIAGNOSIS — D862 Sarcoidosis of lung with sarcoidosis of lymph nodes: Secondary | ICD-10-CM

## 2018-03-15 DIAGNOSIS — G4733 Obstructive sleep apnea (adult) (pediatric): Secondary | ICD-10-CM

## 2018-03-15 DIAGNOSIS — Z209 Contact with and (suspected) exposure to unspecified communicable disease: Secondary | ICD-10-CM

## 2018-03-15 NOTE — Assessment & Plan Note (Signed)
CT chest and spirometry in March 2019 showed stable disease.  With no airflow obstruction or restriction on spirometry.  CT showed no progressive sarcoid changes.  There was actually minimal disease.  No adenopathy is noted. Does not appear to have active sarcoid .  Continue on monitor .

## 2018-03-15 NOTE — Assessment & Plan Note (Signed)
Cont on CPAP At bedtime  

## 2018-03-15 NOTE — Assessment & Plan Note (Signed)
Chronic exposure to bats - found in attic of house she has lived in for 25 years. Concern for possible exposure to airborne illness . Recommended by wild life officer to be checked for histoplasmosis .  Check urine /serum ag.  Check cxr for active issues.

## 2018-03-15 NOTE — Progress Notes (Signed)
@Patient  ID: Lindsey Copeland, female    DOB: [birth year], 59 y.o.   MRN: [id number]  Chief Complaint  Patient presents with  . Acute Visit    Cough     Referring provider: [name], MD  HPI:  57 yo female former smoker followed for Sarcoid and moderate OSA   Significant tests/ events  01/2013 PSG -wt 190 pounds, BMI 34 showed predominant hypopneas during supine sleep and with AHI of 16 events per hour. Nonsupine AHI was only 3 events per hour.   PFTs 07/2012 nml lung volumes, no obs, DLCO 71% ACE 82  [facility] 2014 >Cytology neg for maligancy cells, nondx , cx neg (sputum/AFB /Fungal)  PFT 10/2014 >>ratio 80, FVC 104%, FEV1 108%, DLCO 83%-normal CT chest 05/2013 Mediastinal/bilateral hilar lymphadenopathy, Peribronchovascular nodularity throughout all lobes, numerous lesions in the spleen. CXR 05/2015 decreased LNs HRCT chest > March 2019> very subtle changes in the lungs with a few scattered micronodules measuring 1 to 2 mm.  No ILD.  No notable adenopathy on noncontrasted CT.  Spirometry 08/2017 >normal    03/15/2018 Follow up : Sarcoid and OSA  Patient returns for a 75-month follow-up.. Patient has underlying presumed sarcoidosis diagnosed in 2014 with CT changes with adenopathy, elevated ACE level.  She was steroid responsive with improvement in clinical symptoms and decreased adenopathy .  Patient had a follow-up CT chest and spirometry in March 2019 that showed very subtle changes in the lungs with a few scattered micronodules only measuring 1 to 2 mm.  No interstitial lung changes.  No notable adenopathy on this noncontrasted CT. She says she continues to have shortness of breath with activity.  Has minimum cough. She says she recently has found out that she has multiple bats in her attic.  The wild life person told her that they have been there for many years.  She is lived in her house for greater than 25 years.  She was instructed to be checked for histoplasmosis.   Patient denies any fever, hemoptysis, productive cough.   She says she has multiple issues with joint pain fatigue.  She is currently being referred to rheumatology has appointment in 2 weeks.  Patient has sleep apnea.  She is on CPAP.  Patient says she is doing well on CPAP.  She has no complaints.   Allergies  Allergen Reactions  . Cinnamon Shortness Of Breath  . Sulfa Antibiotics     hives  . Indomethacin Palpitations  . Robaxin [Methocarbamol] Palpitations    Immunization History  Administered Date(s) Administered  . Influenza Split 04/08/2013, 03/28/2017  . Influenza Whole 04/30/2012  . Influenza,inj,Quad PF,6+ Mos 03/27/2015, 04/18/2016  . Influenza-Unspecified 03/30/2014    Past Medical History:  Diagnosis Date  . Asthma   . Carpal tunnel syndrome   . COPD (chronic obstructive pulmonary disease) (HCC)   . Dysrhythmia    palpitations  . GERD (gastroesophageal reflux disease)   . Graves disease   . Hyperlipidemia   . Hyperthyroidism   . Sarcoidosis   . Sleep apnea    CPAP  . Type 2 diabetes mellitus (HCC)   . Vitamin D deficiency     Tobacco History: Social History   Tobacco Use  Smoking Status Former Smoker  . Packs/day: 0.10  . Years: 10.00  . Pack years: 1.00  . Types: Cigarettes  . Last attempt to quit: 06/30/2002  . Years since quitting: 15.7  Smokeless Tobacco Never Used   Counseling given: Not Answered   Outpatient  Medications Prior to Visit  Medication Sig Dispense Refill  . acetaminophen (TYLENOL) 500 MG tablet Take 500 mg by mouth every 6 (six) hours as needed for mild pain or headache.     . albuterol (PROVENTIL HFA;VENTOLIN HFA) 108 (90 Base) MCG/ACT inhaler Inhale 2 puffs into the lungs every 6 (six) hours as needed for wheezing or shortness of breath. 1 Inhaler 3  . aspirin 81 MG tablet Take 81 mg by mouth daily.    Marland Kitchen atorvastatin (LIPITOR) 80 MG tablet Take 80 mg by mouth daily.    . budesonide-formoterol (SYMBICORT) 160-4.5 MCG/ACT  inhaler Take 2 puffs by mouth 2 (two) times daily.     . Cholecalciferol (VITAMIN D-3) 1000 UNITS CAPS Take 1 capsule by mouth daily.     . cyclobenzaprine (FLEXERIL) 10 MG tablet Take 1 tablet by mouth daily as needed.    . fluticasone (FLONASE) 50 MCG/ACT nasal spray Place 2 sprays into both nostrils daily as needed for allergies or rhinitis. 48 g 3  . ibuprofen (ADVIL,MOTRIN) 800 MG tablet Take 800 mg by mouth every 8 (eight) hours as needed.    Marland Kitchen ketoconazole (NIZORAL) 2 % cream Apply 1 application topically 2 (two) times daily.    . meloxicam (MOBIC) 15 MG tablet Take 15 mg by mouth daily.    . metFORMIN (GLUCOPHAGE) 500 MG tablet Take 1,000 mg by mouth every evening.     . Multiple Vitamin (MULTIVITAMIN) tablet Take 1 tablet by mouth daily.    Marland Kitchen omeprazole (PRILOSEC) 20 MG capsule Take 20 mg by mouth daily as needed (indigestion).     . SUMAtriptan (IMITREX) 50 MG tablet Take 1 tablet (50 mg total) by mouth every 2 (two) hours as needed for migraine. May repeat in 2 hours if headache persists or recurs. 10 tablet 0  . traMADol (ULTRAM) 50 MG tablet Take 50 mg by mouth every 6 (six) hours as needed.    . venlafaxine XR (EFFEXOR XR) 37.5 MG 24 hr capsule Take 2 capsules (75 mg total) by mouth daily with breakfast. 60 capsule 11   No facility-administered medications prior to visit.      Review of Systems  Constitutional:   No  weight loss, night sweats,  Fevers, chills, + fatigue, or  lassitude.  HEENT:   No headaches,  Difficulty swallowing,  Tooth/dental problems, or  Sore throat,                No sneezing, itching, ear ache, nasal congestion, post nasal drip,   CV:  No chest pain,  Orthopnea, PND, swelling in lower extremities, anasarca, dizziness, palpitations, syncope.   GI  No heartburn, indigestion, abdominal pain, nausea, vomiting, diarrhea, change in bowel habits, loss of appetite, bloody stools.   Resp: .  No chest wall deformity  Skin: no rash or lesions.  GU: no  dysuria, change in color of urine, no urgency or frequency.  No flank pain, no hematuria   MS:  No joint pain or swelling.  No decreased range of motion.  No back pain.    Physical Exam  BP 126/72 (BP Location: Left Arm, Cuff Size: Normal)   Pulse 83   Ht 5' (1.524 m)   Wt 193 lb 9.6 oz (87.8 kg)   SpO2 98%   BMI 37.81 kg/m   GEN: A/Ox3; pleasant , NAD, obese    HEENT:  [place]/AT,  EACs-clear, TMs-wnl, NOSE-clear, THROAT-clear, no lesions, no postnasal drip or exudate noted. Class 2-3 MP airway  NECK:  Supple w/ fair ROM; no JVD; normal carotid impulses w/o bruits; no thyromegaly or nodules palpated; no lymphadenopathy.    RESP  Clear  P & A; w/o, wheezes/ rales/ or rhonchi. no accessory muscle use, no dullness to percussion  CARD:  RRR, no m/r/g, tr  peripheral edema, pulses intact, no cyanosis or clubbing.  GI:   Soft & nt; nml bowel sounds; no organomegaly or masses detected.   Musco: Warm bil, no deformities or joint swelling noted.   Neuro: alert, no focal deficits noted.    Skin: Warm, no lesions or rashes    Lab Results:  CBC  BNP No results found for: BNP  ProBNP No results found for: PROBNP  Imaging: No results found.   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  With no airflow obstruction or restriction on spirometry.  CT showed no progressive sarcoid changes.  There was actually minimal disease.  No adenopathy is noted. Does not appear to have active sarcoid .  Continue on monitor .    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 Concern for possible exposure to airborne illness . Recommended by wild life officer to be checked for histoplasmosis .  Check urine /serum ag.  Check cxr for active issues.      Rubye Oaksammy Brieanna Nau,  NP 03/15/2018

## 2018-03-15 NOTE — Patient Instructions (Addendum)
Continue on CPAP At bedtime   Work on healthy weight Do not drive a sleepy Continue on Symbicort twice daily.  Labs and chest xray today  Follow-up with Dr. Vassie LollAlva in or [place] NP in 4 weeks and As needed   Please contact office for sooner follow up if symptoms do not improve or worsen or seek emergency care

## 2018-03-16 ENCOUNTER — Encounter: Payer: Self-pay | Admitting: Internal Medicine

## 2018-03-16 ENCOUNTER — Ambulatory Visit (INDEPENDENT_AMBULATORY_CARE_PROVIDER_SITE_OTHER)
Admission: RE | Admit: 2018-03-16 | Discharge: 2018-03-16 | Disposition: A | Discharge status: Home / Self Care | Source: Ambulatory Visit | Attending: Adult Health | Admitting: Adult Health

## 2018-03-16 ENCOUNTER — Ambulatory Visit (INDEPENDENT_AMBULATORY_CARE_PROVIDER_SITE_OTHER): Admitting: Internal Medicine

## 2018-03-16 VITALS — BP 112/72 | HR 64 | Ht 60.0 in | Wt 192.0 lb

## 2018-03-16 DIAGNOSIS — K222 Esophageal obstruction: Secondary | ICD-10-CM

## 2018-03-16 DIAGNOSIS — R1310 Dysphagia, unspecified: Secondary | ICD-10-CM

## 2018-03-16 DIAGNOSIS — R0602 Shortness of breath: Secondary | ICD-10-CM | POA: Diagnosis not present

## 2018-03-16 DIAGNOSIS — K229 Disease of esophagus, unspecified: Secondary | ICD-10-CM

## 2018-03-16 DIAGNOSIS — K219 Gastro-esophageal reflux disease without esophagitis: Secondary | ICD-10-CM | POA: Diagnosis not present

## 2018-03-16 DIAGNOSIS — E119 Type 2 diabetes mellitus without complications: Secondary | ICD-10-CM

## 2018-03-16 DIAGNOSIS — D869 Sarcoidosis, unspecified: Secondary | ICD-10-CM | POA: Diagnosis not present

## 2018-03-16 DIAGNOSIS — R935 Abnormal findings on diagnostic imaging of other abdominal regions, including retroperitoneum: Secondary | ICD-10-CM

## 2018-03-16 NOTE — Progress Notes (Signed)
HISTORY OF PRESENT ILLNESS:  Lindsey Copeland is a 57 y.o. 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 Vassie LollAlva regarding dysphagia. Patient reports a 5 year history of intermittent solid food dysphagia. Also long-standing problems with reflux as manifested by regurgitation and pyrosis. For her reflux symptoms she uses omeprazole 20 mg daily sporadically currently describes issues with swallowing as occurring on a daily basis. She underwent both a modified barium swallow and barium swallow x-ray 01/13/2018. I have reviewed the studies. No significant abnormalities on the speech pathology assessment. However, the esophagram revealed changes consistent with dysmotility as well as a hiatal hernia with evidence of reflux and benign-appearing distal esophageal stricture with transient delay passage of a 13 mm barium tablet.review of outside laboratories revealed no significant abnormalities. Last hemoglobin 13.6. Patient's GI review of systems is otherwise remarkable for bloating and occasional diarrhea. She tells me that she underwent colonoscopy with [facility] about 8 years ago. She denies any abnormalities. We have requestedthose records for review. She does take metformin twice daily for her diabetes.  REVIEW OF SYSTEMS:  All non-GI ROS negative unless otherwise stated in the history of present illness except for sinus and allergy, anxiety, arthritis, back pain, visual change, cough, depression, fatigue, headaches, itching, heart murmur, muscle pains, night sweats, shortness of breath, skin rashes, sleeping problems, ankle swelling, excessive thirst, excessive urination, urinary leakage, voice change  Past Medical History:  Diagnosis Date  . Asthma   . Carpal tunnel syndrome   . COPD (chronic obstructive pulmonary disease) (HCC)   . Dysrhythmia    palpitations  . GERD (gastroesophageal reflux disease)   . Graves disease   . Hyperlipidemia   . Hyperthyroidism   . Sarcoidosis    . Sleep apnea    CPAP  . Type 2 diabetes mellitus (HCC)   . Vitamin D deficiency     Past Surgical History:  Procedure Laterality Date  . CARPAL TUNNEL RELEASE Right 02/20/2015   Procedure: RIGHT CARPAL TUNNEL RELEASE;  Surgeon: Cindee SaltGary Kuzma, MD;  Location: [facility];  Service: Orthopedics;  Laterality: Right;  ANESTHESIA:  IV REGIONAL FAB  . CARPAL TUNNEL RELEASE Left 10/09/2015   Procedure: LEFT CARPAL TUNNEL RELEASE;  Surgeon: Cindee SaltGary Kuzma, MD;  Location: [facility];  Service: Orthopedics;  Laterality: Left;  . radioactive iodine treatment    . right knee arthroscopy    . TUBAL LIGATION    . ULNAR NERVE REPAIR    . VIDEO BRONCHOSCOPY  07/30/2012   Procedure: VIDEO BRONCHOSCOPY WITH FLUORO;  Surgeon: Storm FriskPatrick E Wright, MD;  Location: [facility];  Service: Cardiopulmonary;  Laterality: N/A;    Social History Lindsey EchevariaJanie Danzy  reports that she quit smoking about 15 years ago. Her smoking use included cigarettes. She has a 1.00 pack-year smoking history. She has never used smokeless tobacco. She reports that she drinks alcohol. She reports that she does not use drugs.  family history includes Asthma in her brother; Breast cancer in her sister; Diabetes in her mother and sister; Glaucoma in her mother; Heart attack in her brother; Heart failure in her mother; Pancreatic cancer in her maternal aunt.  Allergies  Allergen Reactions  . Cinnamon Shortness Of Breath  . Sulfa Antibiotics     hives  . Indomethacin Palpitations  . Robaxin [Methocarbamol] Palpitations       PHYSICAL EXAMINATION: Vital signs: BP 112/72   Pulse 64   Ht 5' (1.524 m)   Wt 192 lb (87.1  kg)   BMI 37.50 kg/m   Constitutional:pleasant, generally well-appearing, no acute distress Psychiatric: alert and oriented x3, cooperative Eyes: extraocular movements intact, anicteric, conjunctiva pink Mouth: oral pharynx moist, no lesions. No thrush Neck: supple no  lymphadenopathy Cardiovascular: heart regular rate and rhythm, no murmur Lungs: clear to auscultation bilaterally Abdomen: soft, obese, nontender, nondistended, no obvious ascites, no peritoneal signs, normal bowel sounds, no organomegaly Rectal:omitted Extremities: no clubbing, cyanosis, or lower extremity edema bilaterally Skin: no lesions on visible extremities Neuro: No focal deficits. Normal DTRs. Cranial nerves intact  ASSESSMENT:  #1. Consult food dysphagia secondary to esophageal stricture #2. Abnormal esophagram as described with dysmotility and stricture #3. Chronic GERD. Inadequately treated #4. Reports prior negative screening colonoscopy   PLAN:  #1. Reflux precautions #2. Instructed to take omeprazole 20 mg DAILY. This has been prescribed for the patient #3. Schedule upper endoscopy with esophageal dilation.The nature of the procedure, as well as the risks, benefits, and alternatives were carefully and thoroughly reviewed with the patient. Ample time for discussion and questions allowed. The patient understood, was satisfied, and agreed to proceed. I provided her with diagrams and multiple forms of literature after personally reviewing this with her. #4. Hold diabetic medications the day of the procedure to avoid hypoglycemia #5. Obtain outside colonoscopy report for review to confirm findings and be able to advise regarding proper follow-up #6. Ongoing general medical care with Dr. Clarene Duke  A copy of this consultation note has been sent to Dr. Vassie Loll

## 2018-03-16 NOTE — Patient Instructions (Signed)

## 2018-03-19 LAB — HISTOPLASMA ANTIGEN, URINE

## 2018-03-19 LAB — HISTOPLASMA ANTIBODIES: Histoplasma Ab, Immunodiffusion: NEGATIVE

## 2018-04-08 ENCOUNTER — Telehealth: Payer: Self-pay | Admitting: Pulmonary Disease

## 2018-04-08 MED ORDER — BUDESONIDE-FORMOTEROL FUMARATE 160-4.5 MCG/ACT IN AERO: 2.0000 | INHALATION_SPRAY | Freq: Two times a day (BID) | RESPIRATORY_TRACT | 0 refills | Status: DC

## 2018-04-08 NOTE — Telephone Encounter (Signed)
Called and spoke with patient medication has been sent in. Nothing further needed.

## 2018-04-14 ENCOUNTER — Encounter: Payer: Self-pay | Admitting: Internal Medicine

## 2018-04-14 ENCOUNTER — Ambulatory Visit (AMBULATORY_SURGERY_CENTER): Admitting: Internal Medicine

## 2018-04-14 VITALS — BP 118/61 | HR 67 | Temp 97.8°F | Resp 9 | Ht 60.0 in | Wt 192.0 lb

## 2018-04-14 DIAGNOSIS — K222 Esophageal obstruction: Secondary | ICD-10-CM | POA: Diagnosis not present

## 2018-04-14 DIAGNOSIS — R1310 Dysphagia, unspecified: Secondary | ICD-10-CM

## 2018-04-14 DIAGNOSIS — K219 Gastro-esophageal reflux disease without esophagitis: Secondary | ICD-10-CM | POA: Diagnosis not present

## 2018-04-14 DIAGNOSIS — R935 Abnormal findings on diagnostic imaging of other abdominal regions, including retroperitoneum: Secondary | ICD-10-CM

## 2018-04-14 DIAGNOSIS — R1319 Other dysphagia: Secondary | ICD-10-CM

## 2018-04-14 MED ORDER — SODIUM CHLORIDE 0.9 % IV SOLN: 500.0000 mL | Freq: Once | INTRAVENOUS | Status: DC

## 2018-04-14 NOTE — Op Note (Signed)
[facility] Patient Name: Lindsey Copeland Procedure Date: 04/14/2018 10:05 AM MRN: [id number] Endoscopist: Wilhemina Bonito. Marina Goodell , MD Age: 57 Referring MD:  Date of Birth: 12/07/1960 Gender: Female Account #: [id number] Procedure:                Upper GI endoscopy Maloney dilation of the                            esophagus?"[address] [place] Indications:              Dysphagia, Esophageal reflux, Abnormal                            cine-esophagram Medicines:                Monitored Anesthesia Care Procedure:                Pre-Anesthesia Assessment:                           - Prior to the procedure, a History and Physical                            was performed, and patient medications and                            allergies were reviewed. The patient's tolerance of                            previous anesthesia was also reviewed. The risks                            and benefits of the procedure and the sedation                            options and risks were discussed with the patient.                            All questions were answered, and informed consent                            was obtained. Prior Anticoagulants: The patient has                            taken no previous anticoagulant or antiplatelet                            agents. ASA Grade Assessment: II - A patient with                            mild systemic disease. After reviewing the risks                            and benefits, the patient was deemed in  satisfactory condition to undergo the procedure.                           After obtaining informed consent, the endoscope was                            passed under direct vision. Throughout the                            procedure, the patient's blood pressure, pulse, and                            oxygen saturations were monitored continuously. The                            Endoscope was introduced through the mouth, and                          advanced to the second part of duodenum. The upper                            GI endoscopy was accomplished without difficulty.                            The patient tolerated the procedure well. Scope In: Scope Out: Findings:                 One benign-appearing, intrinsic moderate stenosis                            was found 35 cm from the incisors. This stenosis                            measured 1.5 cm (inner diameter). The scope was                            withdrawn. Dilation was performed with a Maloney                            dilator with no resistance at 54 Fr.                           The exam of the esophagus was otherwise normal.                           The stomach was normal.                           The examined duodenum was normal.                           The cardia and gastric fundus were normal on                            retroflexion. Complications:  No immediate complications. Estimated Blood Loss:     Estimated blood loss: none. Impression:               - Benign-appearing esophageal stenosis. Dilated.                           - Normal stomach.                           - Normal examined duodenum.                           - No specimens collected. Recommendation:           - Patient has a contact number available for                            emergencies. The signs and symptoms of potential                            delayed complications were discussed with the                            patient. Return to normal activities tomorrow.                            Written discharge instructions were provided to the                            patient.                           - Post dilation diet.                           - Continue present medications. Take omeprazole                            once daily as recommended                           - Office follow-up with Dr. Marina Goodell in approximately                             3 months. Wilhemina Bonito. Marina Goodell, MD 04/14/2018 10:22:30 AM This report has been signed electronically.

## 2018-04-14 NOTE — Progress Notes (Signed)
A and O x3. Report to RN. Tolerated MAC anesthesia well.Teeth unchanged after procedure.

## 2018-04-14 NOTE — Progress Notes (Signed)
Called to room to assist during endoscopic procedure.  Patient ID and intended procedure confirmed with present staff. Received instructions for my participation in the procedure from the performing physician.  

## 2018-04-14 NOTE — Patient Instructions (Signed)
YOU HAD AN ENDOSCOPIC PROCEDURE TODAY AT THE [facility]:   Refer to the procedure report that was given to you for any specific questions about what was found during the examination.  If the procedure report does not answer your questions, please call your gastroenterologist to clarify.  If you requested that your care partner not be given the details of your procedure findings, then the procedure report has been included in a sealed envelope for you to review at your convenience later.  YOU SHOULD EXPECT: Some feelings of bloating in the abdomen. Passage of more gas than usual.  Walking can help get rid of the air that was put into your GI tract during the procedure and reduce the bloating. If you had a lower endoscopy (such as a colonoscopy or flexible sigmoidoscopy) you may notice spotting of blood in your stool or on the toilet paper. If you underwent a bowel prep for your procedure, you may not have a normal bowel movement for a few days.  Please Note:  You might notice some irritation and congestion in your nose or some drainage.  This is from the oxygen used during your procedure.  There is no need for concern and it should clear up in a day or so.  SYMPTOMS TO REPORT IMMEDIATELY:   Following lower endoscopy (colonoscopy or flexible sigmoidoscopy):  Excessive amounts of blood in the stool  Significant tenderness or worsening of abdominal pains  Swelling of the abdomen that is new, acute  Fever of 100F or higher   Following upper endoscopy (EGD)  Vomiting of blood or coffee ground material  New chest pain or pain under the shoulder blades  Painful or persistently difficult swallowing  New shortness of breath  Fever of 100F or higher  Black, tarry-looking stools  For urgent or emergent issues, a gastroenterologist can be reached at any hour by calling [phone].   DIET:  We do recommend a small meal at first, but then you may proceed to your regular diet.  Drink  plenty of fluids but you should avoid alcoholic beverages for 24 hours.  ACTIVITY:  You should plan to take it easy for the rest of today and you should NOT DRIVE or use heavy machinery until tomorrow (because of the sedation medicines used during the test).    FOLLOW UP: Our staff will call the number listed on your records the next business day following your procedure to check on you and address any questions or concerns that you may have regarding the information given to you following your procedure. If we do not reach you, we will leave a message.  However, if you are feeling well and you are not experiencing any problems, there is no need to return our call.  We will assume that you have returned to your regular daily activities without incident.  If any biopsies were taken you will be contacted by phone or by letter within the next 1-3 weeks.  Please call us at [phone] if you have not heard about the biopsies in 3 weeks.    SIGNATURES/CONFIDENTIALITY: You and/or your care partner have signed paperwork which will be entered into your electronic medical record.  These signatures attest to the fact that that the information above on your After Visit Summary has been reviewed and is understood.  Full responsibility of the confidentiality of this discharge information lies with you and/or your care-partner.  Post dilation diet given.  Take omeprazole once daily.  See  Dr. Marina Goodell in 3 months.

## 2018-04-15 ENCOUNTER — Telehealth: Payer: Self-pay

## 2018-04-15 NOTE — Telephone Encounter (Signed)
  Follow up Call-  Call back number 04/14/2018  Post procedure Call Back phone  # [phone]  Permission to leave phone message Yes  Some recent data might be hidden     Patient questions:  Do you have a fever, pain , or abdominal swelling? No. Pain Score  0 *  Have you tolerated food without any problems? Yes.    Have you been able to return to your normal activities? Yes.    Do you have any questions about your discharge instructions: Diet   No. Medications  No. Follow up visit  No.  Do you have questions or concerns about your Care? No.  Actions: * If pain score is 4 or above: No action needed, pain <4.

## 2018-05-04 ENCOUNTER — Ambulatory Visit (INDEPENDENT_AMBULATORY_CARE_PROVIDER_SITE_OTHER): Admitting: Licensed Clinical Social Worker

## 2018-05-04 DIAGNOSIS — F3341 Major depressive disorder, recurrent, in partial remission: Secondary | ICD-10-CM | POA: Diagnosis not present

## 2018-06-01 ENCOUNTER — Ambulatory Visit (INDEPENDENT_AMBULATORY_CARE_PROVIDER_SITE_OTHER): Admitting: Licensed Clinical Social Worker

## 2018-06-01 DIAGNOSIS — F3341 Major depressive disorder, recurrent, in partial remission: Secondary | ICD-10-CM | POA: Diagnosis not present

## 2018-06-09 ENCOUNTER — Ambulatory Visit (INDEPENDENT_AMBULATORY_CARE_PROVIDER_SITE_OTHER): Admitting: Primary Care

## 2018-06-09 ENCOUNTER — Encounter: Payer: Self-pay | Admitting: Primary Care

## 2018-06-09 VITALS — BP 108/66 | HR 80 | Temp 98.5°F | Ht 60.0 in | Wt 195.0 lb

## 2018-06-09 DIAGNOSIS — J209 Acute bronchitis, unspecified: Secondary | ICD-10-CM | POA: Diagnosis not present

## 2018-06-09 MED ORDER — PREDNISONE 10 MG PO TABS: ORAL_TABLET | ORAL | 0 refills | Status: DC

## 2018-06-09 MED ORDER — AZITHROMYCIN 250 MG PO TABS: ORAL_TABLET | ORAL | 0 refills | Status: DC

## 2018-06-09 NOTE — Progress Notes (Signed)
@Patient  ID: Lindsey Copeland, female    DOB: 10-20-1960, 57 y.o.   MRN: [id number]  Chief Complaint  Patient presents with  . Acute Visit    cough with dark yellow mucus, sore throat, feverish, dizzy, wheezing x3 days    Referring provider: [name], MD  HPI: 57 year old female, former smoker (1 pack year hx). PMH OSA, pulmonary sarcoidosis, GERD. Patient of Dr. Vassie Loll, last seen by Pulmonary NP on 03/15/18. CT chest and spirometry in March 2019 showed stable disease.   06/09/2018 Patient presents today for acute visit with complaints of productive cough with dark yellow mucus x 2 days. Associated sinus congestion, wheezing, s/t and voice hoarseness. States that she's been around a lot of people over the holiday. She has not tried ventolin rescue inahler. Continues using symbicort twice a day.    Allergies  Allergen Reactions  . Cinnamon Shortness Of Breath  . Sulfa Antibiotics     hives  . Indomethacin Palpitations  . Robaxin [Methocarbamol] Palpitations    Immunization History  Administered Date(s) Administered  . Influenza Split 04/08/2013, 03/28/2017  . Influenza Whole 04/30/2012  . Influenza,inj,Quad PF,6+ Mos 03/27/2015, 04/18/2016  . Influenza-Unspecified 03/30/2014    Past Medical History:  Diagnosis Date  . Asthma   . Carpal tunnel syndrome   . COPD (chronic obstructive pulmonary disease) (HCC)    sarcoidosis  . Dysrhythmia    palpitations  . GERD (gastroesophageal reflux disease)   . Graves disease   . Hyperlipidemia   . Hyperthyroidism   . Leaky heart valve   . Sarcoidosis   . Sleep apnea    CPAP  . Type 2 diabetes mellitus (HCC)   . Vitamin D deficiency     Tobacco History: Social History   Tobacco Use  Smoking Status Former Smoker  . Packs/day: 0.10  . Years: 10.00  . Pack years: 1.00  . Types: Cigarettes  . Last attempt to quit: 06/30/2002  . Years since quitting: 15.9  Smokeless Tobacco Never Used   Counseling given: Not  Answered   Outpatient Medications Prior to Visit  Medication Sig Dispense Refill  . acetaminophen (TYLENOL) 500 MG tablet Take 500 mg by mouth every 6 (six) hours as needed for mild pain or headache.     . albuterol (PROVENTIL HFA;VENTOLIN HFA) 108 (90 Base) MCG/ACT inhaler Inhale 2 puffs into the lungs every 6 (six) hours as needed for wheezing or shortness of breath. 1 Inhaler 3  . aspirin 81 MG tablet Take 81 mg by mouth daily.    Marland Kitchen atorvastatin (LIPITOR) 80 MG tablet Take 80 mg by mouth daily.    . budesonide-formoterol (SYMBICORT) 160-4.5 MCG/ACT inhaler Inhale 2 puffs into the lungs 2 (two) times daily. 3 Inhaler 0  . Calcium Carbonate Antacid (TUMS E-X 750 PO) Take by mouth as needed.    . Cholecalciferol (VITAMIN D-3) 1000 UNITS CAPS Take 1 capsule by mouth daily.     . cyclobenzaprine (FLEXERIL) 10 MG tablet Take 1 tablet by mouth daily as needed.    . fluticasone (FLONASE) 50 MCG/ACT nasal spray Place 2 sprays into both nostrils daily as needed for allergies or rhinitis. 48 g 3  . ibuprofen (ADVIL,MOTRIN) 800 MG tablet Take 800 mg by mouth every 8 (eight) hours as needed.    Marland Kitchen ketoconazole (NIZORAL) 2 % cream Apply 1 application topically 2 (two) times daily.    . meloxicam (MOBIC) 15 MG tablet Take 15 mg by mouth daily.    Marland Kitchen  metFORMIN (GLUCOPHAGE) 500 MG tablet Take 1,000 mg by mouth every evening.     . Multiple Vitamin (MULTIVITAMIN) tablet Take 1 tablet by mouth daily.    Marland Kitchen. omeprazole (PRILOSEC) 20 MG capsule Take 20 mg by mouth daily as needed (indigestion).     . SUMAtriptan (IMITREX) 50 MG tablet Take 1 tablet (50 mg total) by mouth every 2 (two) hours as needed for migraine. May repeat in 2 hours if headache persists or recurs. 10 tablet 0  . venlafaxine XR (EFFEXOR XR) 37.5 MG 24 hr capsule Take 2 capsules (75 mg total) by mouth daily with breakfast. 60 capsule 11  . traMADol (ULTRAM) 50 MG tablet Take 50 mg by mouth every 6 (six) hours as needed.     No  facility-administered medications prior to visit.       Review of Systems  Review of Systems   Physical Exam  BP 108/66 (BP Location: Left Arm, Cuff Size: Normal)   Pulse 80   Temp 98.5 F (36.9 C)   Ht 5' (1.524 m)   Wt 195 lb (88.5 kg)   SpO2 96%   BMI 38.08 kg/m  Physical Exam  Constitutional: She is oriented to person, place, and time. She appears well-developed and well-nourished. No distress.  HENT:  Head: Normocephalic and atraumatic.  Voice hoarseness   Eyes: Pupils are equal, round, and reactive to light. EOM are normal.  Neck: Normal range of motion. Neck supple.  Cardiovascular: Normal rate and regular rhythm.  Pulmonary/Chest: Effort normal. She has wheezes.  Musculoskeletal: Normal range of motion.  Neurological: She is alert and oriented to person, place, and time.  Skin: Skin is warm and dry.  Psychiatric: She has a normal mood and affect. Her behavior is normal. Judgment and thought content normal.     Lab Results:  CBC    Component Value Date/Time   HGB 13.6 10/09/2015 0730   HCT 40.0 10/09/2015 0730    BMET    Component Value Date/Time   NA 141 10/09/2015 0730   K 3.7 10/09/2015 0730   CL 105 10/09/2015 0730   CO2 29 02/19/2015 0930   GLUCOSE 93 10/09/2015 0730   BUN 17 10/09/2015 0730   CREATININE 0.60 10/09/2015 0730   CALCIUM 9.4 02/19/2015 0930   GFRNONAA >60 02/19/2015 0930   GFRAA >60 02/19/2015 0930    BNP No results found for: BNP  ProBNP No results found for: PROBNP  Imaging: No results found.   Assessment & Plan:   Bronchitis, acute - Appears viral in nature but d/t hx pulmonary sarcoidosis will conservatively treat - Zpack as prescribed - Prednisone 20mg  x 5 days - Encourage delsym cough syrup 5ml every 12 hrs - Use ventolin rescue inhaler 2 puffs every 4-6 hours as needed  - Return if no improvement consider CXR    Lindsey BayleyElizabeth W Walsh, NP 06/09/2018

## 2018-06-09 NOTE — Patient Instructions (Signed)
Zpack as prescribed Prednisone 20mg  x 5 days  Use ventolin rescue inhaler every 4-6 hours for shortness of breath/wheezing  Delsym cough syrup 5ml every 12 hours   Get plenty of rest and fluids   Return if not better in 7-10 days or if symptoms worsen

## 2018-06-09 NOTE — Assessment & Plan Note (Signed)
-   Appears viral in nature but d/t hx pulmonary sarcoidosis will conservatively treat - Zpack as prescribed - Prednisone 20mg  x 5 days - Encourage delsym cough syrup 5ml every 12 hrs - Use ventolin rescue inhaler 2 puffs every 4-6 hours as needed  - Return if no improvement consider CXR

## 2018-06-18 ENCOUNTER — Ambulatory Visit (INDEPENDENT_AMBULATORY_CARE_PROVIDER_SITE_OTHER): Admitting: Adult Health

## 2018-06-18 ENCOUNTER — Encounter: Payer: Self-pay | Admitting: Adult Health

## 2018-06-18 DIAGNOSIS — D862 Sarcoidosis of lung with sarcoidosis of lymph nodes: Secondary | ICD-10-CM | POA: Diagnosis not present

## 2018-06-18 DIAGNOSIS — J209 Acute bronchitis, unspecified: Secondary | ICD-10-CM

## 2018-06-18 DIAGNOSIS — G4733 Obstructive sleep apnea (adult) (pediatric): Secondary | ICD-10-CM

## 2018-06-18 MED ORDER — LEVALBUTEROL HCL 0.63 MG/3ML IN NEBU
0.6300 mg | INHALATION_SOLUTION | Freq: Once | RESPIRATORY_TRACT | Status: AC
  Administered 2018-06-18: 0.63 mg via RESPIRATORY_TRACT

## 2018-06-18 NOTE — Progress Notes (Signed)
 @Patient  ID: Lindsey EchevariaJanie Salome, female    DOB: 07/01/1960, 57 y.o.   MRN: [id number]  Chief Complaint  Patient presents with  . Follow-up    Sarcoid     Referring provider: Catha GosselinLittle, Kevin, MD  HPI: 57yo female former smoker followed for Sarcoid and moderate OSA   Significant tests/ events  01/2013 PSG -wt 190 pounds, BMI 34 showed predominant hypopneas during supine sleep and with AHI of 16 events per hour. Nonsupine AHI was only 3 events per hour.   PFTs 07/2012 nml lung volumes, no obs, DLCO 71% ACE 82  [facility] 2014 >Cytology neg for maligancy cells, nondx , cx neg (sputum/AFB /Fungal)  PFT 10/2014 >>ratio 80, FVC 104%, FEV1 108%, DLCO 83%-normal CT chest 05/2013 Mediastinal/bilateral hilar lymphadenopathy, Peribronchovascular nodularity throughout all lobes, numerous lesions in the spleen. CXR 05/2015 decreased LNs HRCT chest > March 2019> very subtle changes in the lungs with a few scattered micronodules measuring 1 to 2 mm.  No ILD.  No notable adenopathy on noncontrasted CT.  Spirometry 08/2017 >normal   Home bat exposure attic / Histoplasmosis ag/ab  02/2018 >neg   06/18/2018  Follow up : Sarcoid and OSA  Patient returns for a 3556-month follow-up.  She has underlying presumed sarcoidosis diagnosed in 2014 with CT changes with adenopathy and an elevated ACE level.  She was steroid responsive with clinical improvement and radiographical decreased adenopathy.  Follow-up CT chest and spirometry in March 2019 showed very subtle changes in the lungs with a few scattered micronodules.  And no interstitial lung changes.  No notable adenopathy was noted on this noncontrasted CT chest.  Spirometry was normal with no airflow obstruction or restriction. Patient was treated 2 weeks ago for an acute bronchitis with a Z-Pak and prednisone.  Patient says she is starting to feel better. Still has nasal congestion , drainage , hoarseness. Mucus is clear .  Remains on Symbicort.   Patient was  [facility] and has been diagnosed with rheumatoid arthritis.  She has been started on methotrexate.  She was unable to tolerate this due to significant GI symptoms. Has upcoming follow up office visit with them. On Mobic daily which helps some with joint discomfort. Was sent to ortho, dx with spinal stenosis , plans for probable epidural injection for pain .   Remains on CPAP each night . Wears for at least 4-6 hr each night. No significant daytime sleepiness.   Allergies  Allergen Reactions  . Cinnamon Shortness Of Breath  . Sulfa Antibiotics     hives  . Indomethacin Palpitations  . Robaxin [Methocarbamol] Palpitations    Immunization History  Administered Date(s) Administered  . Influenza Split 04/08/2013, 03/28/2017, 04/28/2018  . Influenza Whole 04/30/2012  . Influenza,inj,Quad PF,6+ Mos 03/27/2015, 04/18/2016  . Influenza-Unspecified 03/30/2014    Past Medical History:  Diagnosis Date  . Asthma   . Carpal tunnel syndrome   . COPD (chronic obstructive pulmonary disease) (HCC)    sarcoidosis  . Dysrhythmia    palpitations  . GERD (gastroesophageal reflux disease)   . Graves disease   . Hyperlipidemia   . Hyperthyroidism   . Leaky heart valve   . Sarcoidosis   . Sleep apnea    CPAP  . Type 2 diabetes mellitus (HCC)   . Vitamin D deficiency     Tobacco History: Social History   Tobacco Use  Smoking Status Former Smoker  . Packs/day: 0.10  . Years: 10.00  . Pack years:  1.00  . Types: Cigarettes  . Last attempt to quit: 06/30/2002  . Years since quitting: 15.9  Smokeless Tobacco Never Used   Counseling given: Not Answered   Outpatient Medications Prior to Visit  Medication Sig Dispense Refill  . acetaminophen (TYLENOL) 500 MG tablet Take 500 mg by mouth every 6 (six) hours as needed for mild pain or headache.     . albuterol (PROVENTIL HFA;VENTOLIN HFA) 108 (90 Base) MCG/ACT inhaler Inhale 2 puffs into the lungs every 6 (six)  hours as needed for wheezing or shortness of breath. 1 Inhaler 3  . aspirin 81 MG tablet Take 81 mg by mouth daily.    Marland Kitchen. atorvastatin (LIPITOR) 80 MG tablet Take 80 mg by mouth daily.    . budesonide-formoterol (SYMBICORT) 160-4.5 MCG/ACT inhaler Inhale 2 puffs into the lungs 2 (two) times daily. 3 Inhaler 0  . Calcium Carbonate Antacid (TUMS E-X 750 PO) Take by mouth as needed.    . Cholecalciferol (VITAMIN D-3) 1000 UNITS CAPS Take 1 capsule by mouth daily.     . cyclobenzaprine (FLEXERIL) 10 MG tablet Take 1 tablet by mouth daily as needed.    . fluticasone (FLONASE) 50 MCG/ACT nasal spray Place 2 sprays into both nostrils daily as needed for allergies or rhinitis. 48 g 3  . ibuprofen (ADVIL,MOTRIN) 800 MG tablet Take 800 mg by mouth every 8 (eight) hours as needed.    Marland Kitchen. ketoconazole (NIZORAL) 2 % cream Apply 1 application topically 2 (two) times daily.    . meloxicam (MOBIC) 15 MG tablet Take 15 mg by mouth daily.    . metFORMIN (GLUCOPHAGE) 500 MG tablet Take 1,000 mg by mouth every evening.     . Multiple Vitamin (MULTIVITAMIN) tablet Take 1 tablet by mouth daily.    Marland Kitchen. omeprazole (PRILOSEC) 20 MG capsule Take 20 mg by mouth daily as needed (indigestion).     . SUMAtriptan (IMITREX) 50 MG tablet Take 1 tablet (50 mg total) by mouth every 2 (two) hours as needed for migraine. May repeat in 2 hours if headache persists or recurs. 10 tablet 0  . venlafaxine XR (EFFEXOR XR) 37.5 MG 24 hr capsule Take 2 capsules (75 mg total) by mouth daily with breakfast. 60 capsule 11  . azithromycin (ZITHROMAX) 250 MG tablet Zpack taper as directed (Patient not taking: Reported on 06/18/2018) 6 tablet 0  . predniSONE (DELTASONE) 10 MG tablet Take 2 tabs x 5 days (Patient not taking: Reported on 06/18/2018) 10 tablet 0   No facility-administered medications prior to visit.      Review of Systems  Constitutional:   No  weight loss, night sweats,  Fevers, chills, + fatigue, or  lassitude.  HEENT:   No  headaches,  Difficulty swallowing,  Tooth/dental problems, or  Sore throat,                No sneezing, itching, ear ache,  +nasal congestion, post nasal drip,   CV:  No chest pain,  Orthopnea, PND, swelling in lower extremities, anasarca, dizziness, palpitations, syncope.   GI  No heartburn, indigestion, abdominal pain, nausea, vomiting, diarrhea, change in bowel habits, loss of appetite, bloody stools.   Resp:   No chest wall deformity  Skin: no rash or lesions.  GU: no dysuria, change in color of urine, no urgency or frequency.  No flank pain, no hematuria   YQ:MVHQIS:Joint pains +   Physical Exam  BP 104/60 (BP Location: Left Arm, Cuff Size: Normal)   Pulse  68   Temp 98.4 F (36.9 C) (Oral)   Ht 5' (1.524 m)   Wt 193 lb 12.8 oz (87.9 kg)   SpO2 97%   BMI 37.85 kg/m   GEN: A/Ox3; pleasant , NAD, well nourished    HEENT:  [place]/AT,  EACs-clear, TMs-wnl, NOSE-clear drainage , THROAT-clear, no lesions, no postnasal drip or exudate noted.   NECK:  Supple w/ fair ROM; no JVD; normal carotid impulses w/o bruits; no thyromegaly or nodules palpated; no lymphadenopathy.    RESP  Clear  P & A; w/o, wheezes/ rales/ or rhonchi. no accessory muscle use, no dullness to percussion  CARD:  RRR, no m/r/g, no peripheral edema, pulses intact, no cyanosis or clubbing.  GI:   Soft & nt; nml bowel sounds; no organomegaly or masses detected.   Musco: Warm bil, no deformities or joint swelling noted.   Neuro: alert, no focal deficits noted.    Skin: Warm, no lesions or rashes    Lab Results:  CBC  BNP No results found for: BNP  ProBNP No results found for: PROBNP  Imaging: No results found.    PFT Results Latest Ref Rng & Units 10/31/2014  FVC-Pre L 2.61  FVC-Predicted Pre % 104  FVC-Post L 2.60  FVC-Predicted Post % 104  Pre FEV1/FVC % % 83  Post FEV1/FCV % % 85  FEV1-Pre L 2.16  FEV1-Predicted Pre % 108  FEV1-Post L 2.22  DLCO UNC% % 83  DLCO COR %Predicted % 106  TLC L  3.87  TLC % Predicted % 82  RV % Predicted % 68    No results found for: NITRICOXIDE      Assessment & Plan:   Sarcoidosis of lung with sarcoidosis of lymph nodes Appears compensated   Plan  Patient Instructions  Add Claritin daily for 1 week , then daily As needed  .  Add Flonase 2 puffs daily for 1 week , then As needed   Mucinex Twice daily  As needed  Cough/congestion  Delsym 2 tsp Twice daily  As needed  Cough .  Continue on Symbicort 2 puffs Twice daily  , rinse after .   Continue on CPAP At bedtime   Work on healthy weight Do not drive a sleepy  Follow-up with Dr. Vassie Loll in or  NP in 3-4 months and As needed   Please contact office for sooner follow up if symptoms do not improve or worsen or seek emergency care       OSA (obstructive sleep apnea) Cont on CPAP   Bronchitis, acute Resolving . Starting to turn the corner.  Hold on additional abx and steroids for now  Treat the symptoms  xopenex neb x 1   Plan  Patient Instructions  Add Claritin daily for 1 week , then daily As needed  .  Add Flonase 2 puffs daily for 1 week , then As needed   Mucinex Twice daily  As needed  Cough/congestion  Delsym 2 tsp Twice daily  As needed  Cough .  Continue on Symbicort 2 puffs Twice daily  , rinse after .   Continue on CPAP At bedtime   Work on healthy weight Do not drive a sleepy  Follow-up with Dr. Vassie Loll in or  NP in 3-4 months and As needed   Please contact office for sooner follow up if symptoms do not improve or worsen or seek emergency care          Rubye Oaks, NP 06/18/2018

## 2018-06-18 NOTE — Patient Instructions (Addendum)
Add Claritin daily for 1 week , then daily As needed  .  Add Flonase 2 puffs daily for 1 week , then As needed   Mucinex Twice daily  As needed  Cough/congestion  Delsym 2 tsp Twice daily  As needed  Cough .  Continue on Symbicort 2 puffs Twice daily  , rinse after .   Continue on CPAP At bedtime   Work on healthy weight Do not drive a sleepy  Follow-up with Dr. Vassie LollAlva in or Debhora Titus NP in 3-4 months and As needed   Please contact office for sooner follow up if symptoms do not improve or worsen or seek emergency care

## 2018-06-18 NOTE — Addendum Note (Signed)
Addended by: Boone MasterJONES, JESSICA E on: 06/18/2018 11:51 AM   Modules accepted: Orders

## 2018-06-18 NOTE — Assessment & Plan Note (Signed)
Cont on CPAP  

## 2018-06-18 NOTE — Assessment & Plan Note (Signed)
Appears compensated   Plan  Patient Instructions  Add Claritin daily for 1 week , then daily As needed  .  Add Flonase 2 puffs daily for 1 week , then As needed   Mucinex Twice daily  As needed  Cough/congestion  Delsym 2 tsp Twice daily  As needed  Cough .  Continue on Symbicort 2 puffs Twice daily  , rinse after .   Continue on CPAP At bedtime   Work on healthy weight Do not drive a sleepy  Follow-up with Dr. Vassie LollAlva in or Uriel Horkey NP in 3-4 months and As needed   Please contact office for sooner follow up if symptoms do not improve or worsen or seek emergency care

## 2018-06-18 NOTE — Assessment & Plan Note (Signed)
Resolving . Starting to turn the corner.  Hold on additional abx and steroids for now  Treat the symptoms  xopenex neb x 1   Plan  Patient Instructions  Add Claritin daily for 1 week , then daily As needed  .  Add Flonase 2 puffs daily for 1 week , then As needed   Mucinex Twice daily  As needed  Cough/congestion  Delsym 2 tsp Twice daily  As needed  Cough .  Continue on Symbicort 2 puffs Twice daily  , rinse after .   Continue on CPAP At bedtime   Work on healthy weight Do not drive a sleepy  Follow-up with Dr. Vassie LollAlva in or Jaelle Campanile NP in 3-4 months and As needed   Please contact office for sooner follow up if symptoms do not improve or worsen or seek emergency care

## 2018-06-24 ENCOUNTER — Other Ambulatory Visit: Payer: Self-pay | Admitting: Pulmonary Disease

## 2018-07-14 ENCOUNTER — Ambulatory Visit (INDEPENDENT_AMBULATORY_CARE_PROVIDER_SITE_OTHER): Admitting: Licensed Clinical Social Worker

## 2018-07-14 DIAGNOSIS — F3341 Major depressive disorder, recurrent, in partial remission: Secondary | ICD-10-CM | POA: Diagnosis not present

## 2018-08-03 ENCOUNTER — Ambulatory Visit: Admitting: Licensed Clinical Social Worker

## 2018-09-07 ENCOUNTER — Encounter: Payer: Self-pay | Admitting: Neurology

## 2018-09-07 ENCOUNTER — Ambulatory Visit (INDEPENDENT_AMBULATORY_CARE_PROVIDER_SITE_OTHER): Admitting: Neurology

## 2018-09-07 VITALS — BP 121/70 | HR 84 | Ht 60.0 in | Wt 200.5 lb

## 2018-09-07 DIAGNOSIS — G43909 Migraine, unspecified, not intractable, without status migrainosus: Secondary | ICD-10-CM

## 2018-09-07 MED ORDER — VENLAFAXINE HCL ER 75 MG PO CP24: 75.0000 mg | ORAL_CAPSULE | Freq: Every day | ORAL | 4 refills | Status: DC

## 2018-09-07 MED ORDER — SUMATRIPTAN SUCCINATE 50 MG PO TABS: 50.0000 mg | ORAL_TABLET | ORAL | 6 refills | Status: AC | PRN

## 2018-09-07 NOTE — Progress Notes (Signed)
[facility]  PATIENT: Lindsey Copeland DOB: March 11, 1961   REASON FOR VISIT: Follow-up for migraine HISTORY FROM: Patient    HISTORY OF PRESENT ILLNESS: 5/16/19YYJanie Copeland is a 58 year old female, seen in refer by her primary care Dr. [name] for evaluation of headaches, with visual disturbance, initial evaluation was on Nov 12, 2017. I have reviewed and summarized the referring note, she has past medical history of hyperlipidemia, diabetes, Graves' disease with hyperthyroidism, status post radioactive iodine ablation 2013, now with subclinical hyperthyroidism, followed by endocrinologist Dr. Sharl Ma, keloid, vitamin D deficiency, bilateral hilar area adenopathy, was diagnosed with sarcoidosis, followed by pulmonologist, sleep apnea using CPAP machine, shingles in the past, she is tearful during today's interview, reported all of stress over the past few years, she denies a previous history of headache. But since January 2019, she began to have frequent headaches, on July 21, 2017, she already had 3 days of moderate to severe headaches, has been taking multiple daily dose of Tylenol mixed with Advil, while she was eating breakfast that morning, she had sudden onset right side blurry vision, she was able to cover left worse his right eye, noticed it was right monocular abnormal vision, she could see right eye upper visual field, but to right lower visual field was blurry, whitish bright, she has to wear sunglasses at her home, the bright light was overwhelming at her house, the visual abnormality last about 15 minutes, gradually disappeared, she had worsening headaches She had almost daily headaches to variable degree since January until early May 2019, she already takes Advil and Tylenol for her joint pain, now she is on higher dose, She had extensive evaluations:  MRI of the brain with without contrast on September 05, 2017 was normal Echocardiogram showed normal  ejection fraction, there was no significant wall motion abnormality. Ultrasound of carotid artery on August 26, 2017 showed minimal amount of bilateral atherosclerotic plaque, no significant stenosis, CT head without contrast July 21, 2017 was normal Laboratory evaluations in February 2019, showed A1c of 7.4, LDL of 95, normal TSH, CMP, with glucose of 124, CBC hemoglobin of 12.5,  UPDATE September 07 2018: Her migraine overall has much improved, Imitrex 50 mg as needed would take away her headache in about 30 minutes,  She recently developed right behind eye pain, was seen by ophthalmologist, was told to have increased intraocular ocular pressure,  She also has chronic low back pain, worse since February, taking gabapentin 300 mg 3 tablets every night which has been helpful,   REVIEW OF SYSTEMS: Full 14 system review of systems performed and notable only for those listed,  Fatigue, eye discharge, itching, redness, light sensitivity, double vision, eye pain, blurry vision, cough, wheezing, shortness of breath, palpitation, constipation, restless leg, insomnia, apnea, frequent awakening, daytime sleepiness, snoring, environmental allergy, frequent infection, joint pain, back pain, neck pain, dizziness, headaches, numbness, agitation, depression, anxiety, all others are neg:   Faituge,  ALLERGIES: Allergies  Allergen Reactions  . Cinnamon Shortness Of Breath  . Sulfa Antibiotics     hives  . Indomethacin Palpitations  . Robaxin [Methocarbamol] Palpitations    HOME MEDICATIONS: Outpatient Medications Prior to Visit  Medication Sig Dispense Refill  . acetaminophen (TYLENOL) 500 MG tablet Take 500 mg by mouth every 6 (six) hours as needed for mild pain or headache.     . albuterol (PROVENTIL HFA;VENTOLIN HFA) 108 (90 Base) MCG/ACT inhaler Inhale 2 puffs into the lungs every 6 (six) hours as needed for wheezing or  shortness of breath. 1 Inhaler 3  . aspirin 81 MG tablet Take 81 mg by  mouth daily.    Marland Kitchen atorvastatin (LIPITOR) 80 MG tablet Take 80 mg by mouth daily.    . Calcium Carbonate Antacid (TUMS E-X 750 PO) Take by mouth as needed.    . Cholecalciferol (VITAMIN D-3) 1000 UNITS CAPS Take 1 capsule by mouth daily.     . cyclobenzaprine (FLEXERIL) 10 MG tablet Take 1 tablet by mouth daily as needed.    . fluticasone (FLONASE) 50 MCG/ACT nasal spray Place 2 sprays into both nostrils daily as needed for allergies or rhinitis. 48 g 3  . gabapentin (NEURONTIN) 300 MG capsule Take by mouth.    Marland Kitchen ibuprofen (ADVIL,MOTRIN) 800 MG tablet Take 800 mg by mouth every 8 (eight) hours as needed.    Marland Kitchen ketoconazole (NIZORAL) 2 % cream Apply 1 application topically 2 (two) times daily.    . meloxicam (MOBIC) 15 MG tablet Take 15 mg by mouth daily.    . metFORMIN (GLUCOPHAGE) 500 MG tablet Take 1,000 mg by mouth every evening.     . Multiple Vitamin (MULTIVITAMIN) tablet Take 1 tablet by mouth daily.    Marland Kitchen omeprazole (PRILOSEC) 20 MG capsule Take 20 mg by mouth daily as needed (indigestion).     . SUMAtriptan (IMITREX) 50 MG tablet Take 1 tablet (50 mg total) by mouth every 2 (two) hours as needed for migraine. May repeat in 2 hours if headache persists or recurs. 10 tablet 0  . SYMBICORT 160-4.5 MCG/ACT inhaler USE 2 INHALATIONS TWICE A DAY 30.6 g 4  . venlafaxine XR (EFFEXOR XR) 37.5 MG 24 hr capsule Take 2 capsules (75 mg total) by mouth daily with breakfast. 60 capsule 11   No facility-administered medications prior to visit.     PAST MEDICAL HISTORY: Past Medical History:  Diagnosis Date  . Arthritis   . Asthma   . Carpal tunnel syndrome   . COPD (chronic obstructive pulmonary disease) (HCC)    sarcoidosis  . Dysrhythmia    palpitations  . GERD (gastroesophageal reflux disease)   . Graves disease   . Hyperlipidemia   . Hyperthyroidism   . Leaky heart valve   . Lumbar spinal stenosis   . Sarcoidosis   . Sleep apnea    CPAP  . Type 2 diabetes mellitus (HCC)   . Vitamin D  deficiency     PAST SURGICAL HISTORY: Past Surgical History:  Procedure Laterality Date  . CARPAL TUNNEL RELEASE Right 02/20/2015   Procedure: RIGHT CARPAL TUNNEL RELEASE;  Surgeon: Cindee Salt, MD;  Location: [facility];  Service: Orthopedics;  Laterality: Right;  ANESTHESIA:  IV REGIONAL FAB  . CARPAL TUNNEL RELEASE Left 10/09/2015   Procedure: LEFT CARPAL TUNNEL RELEASE;  Surgeon: Cindee Salt, MD;  Location: [facility];  Service: Orthopedics;  Laterality: Left;  . radioactive iodine treatment    . right knee arthroscopy    . SHOULDER ARTHROSCOPY    . TUBAL LIGATION    . ULNAR NERVE REPAIR    . VIDEO BRONCHOSCOPY  07/30/2012   Procedure: VIDEO BRONCHOSCOPY WITH FLUORO;  Surgeon: Storm Frisk, MD;  Location: [facility];  Service: Cardiopulmonary;  Laterality: N/A;    FAMILY HISTORY: Family History  Problem Relation Age of Onset  . Asthma Brother   . Diabetes Mother   . Heart failure Mother   . Glaucoma Mother   . Heart attack Brother   . Diabetes Sister   .  Breast cancer Sister   . Pancreatic cancer Maternal Aunt   . Colon cancer Neg Hx   . Stomach cancer Neg Hx     SOCIAL HISTORY: Social History   Socioeconomic History  . Marital status: Married    Spouse name: Not on file  . Number of children: 3  . Years of education: Not on file  . Highest education level: Not on file  Occupational History  . Occupation: Child psychotherapist  Social Needs  . Financial resource strain: Not on file  . Food insecurity:    Worry: Not on file    Inability: Not on file  . Transportation needs:    Medical: Not on file    Non-medical: Not on file  Tobacco Use  . Smoking status: Former Smoker    Packs/day: 0.10    Years: 10.00    Pack years: 1.00    Types: Cigarettes    Last attempt to quit: 06/30/2002    Years since quitting: 16.2  . Smokeless tobacco: Never Used  Substance and Sexual Activity  . Alcohol use: Yes    Comment: occasionally  . Drug use:  No  . Sexual activity: Not on file  Lifestyle  . Physical activity:    Days per week: Not on file    Minutes per session: Not on file  . Stress: Not on file  Relationships  . Social connections:    Talks on phone: Not on file    Gets together: Not on file    Attends religious service: Not on file    Active member of club or organization: Not on file    Attends meetings of clubs or organizations: Not on file    Relationship status: Not on file  . Intimate partner violence:    Fear of current or ex partner: Not on file    Emotionally abused: Not on file    Physically abused: Not on file    Forced sexual activity: Not on file  Other Topics Concern  . Not on file  Social History Narrative  . Not on file     PHYSICAL EXAM  Vitals:   09/07/18 0917  BP: 121/70  Pulse: 84  Weight: 200 lb 8 oz (90.9 kg)  Height: 5' (1.524 m)   Body mass index is 39.16 kg/m.  Generalized: Well developed, obese female in no acute distress , well-groomed Head: normocephalic and atraumatic,. Oropharynx benign  Neck: Supple,  Musculoskeletal: No deformity   Neurological examination   Mentation: Alert oriented to time, place, history taking. Attention span and concentration appropriate. Recent and remote memory intact.  Follows all commands speech and language fluent.   Cranial nerve II-XII: .Pupils were equal round reactive to light extraocular movements were full, visual field were full on confrontational test. Facial sensation and strength were normal. hearing was intact to finger rubbing bilaterally. Uvula tongue midline. head turning and shoulder shrug were normal and symmetric.Tongue protrusion into cheek strength was normal. Motor: normal bulk and tone, full strength in the BUE, BLE, fine finger movements normal, no pronator drift. No focal weakness Sensory: normal and symmetric to light touch, pinprick, and  Vibration, in the upper and lower extremities Coordination: finger-nose-finger,  heel-to-shin bilaterally, no dysmetria Reflexes: Brachioradialis 2/2, biceps 2/2, triceps 2/2, patellar 2/2, Achilles 2/2, plantar responses were flexor bilaterally. Gait and Station: Rising up from seated position without assistance, normal stance,   DIAGNOSTIC DATA (LABS, IMAGING, TESTING) - I reviewed patient records, labs, notes, testing and imaging myself  where available.  Lab Results  Component Value Date   HGB 13.6 10/09/2015   HCT 40.0 10/09/2015      Component Value Date/Time   NA 141 10/09/2015 0730   K 3.7 10/09/2015 0730   CL 105 10/09/2015 0730   CO2 29 02/19/2015 0930   GLUCOSE 93 10/09/2015 0730   BUN 17 10/09/2015 0730   CREATININE 0.60 10/09/2015 0730   CALCIUM 9.4 02/19/2015 0930   PROT 8.2 07/23/2012 1713   ALBUMIN 4.2 07/23/2012 1713   AST 18 07/23/2012 1713   ALT 17 07/23/2012 1713   ALKPHOS 86 07/23/2012 1713   BILITOT 0.4 07/23/2012 1713   GFRNONAA >60 02/19/2015 0930   GFRAA >60 02/19/2015 0930   ASSESSMENT AND PLAN Chronic Migraine headaches  Overall has improved with Effexor XR 75 mg daily  Continue Imitrex 50 mg as needed for abortive treatment,   Levert Feinstein, M.D. Ph.D.  [facility] [address] [place], [place] [address] Phone: [phone] Fax:      [phone]

## 2018-09-08 ENCOUNTER — Ambulatory Visit (INDEPENDENT_AMBULATORY_CARE_PROVIDER_SITE_OTHER): Admitting: Licensed Clinical Social Worker

## 2018-09-08 DIAGNOSIS — F3341 Major depressive disorder, recurrent, in partial remission: Secondary | ICD-10-CM | POA: Diagnosis not present

## 2018-09-09 ENCOUNTER — Ambulatory Visit (INDEPENDENT_AMBULATORY_CARE_PROVIDER_SITE_OTHER): Admitting: Nurse Practitioner

## 2018-09-09 ENCOUNTER — Encounter: Payer: Self-pay | Admitting: Nurse Practitioner

## 2018-09-09 ENCOUNTER — Other Ambulatory Visit: Payer: Self-pay

## 2018-09-09 VITALS — BP 116/68 | HR 76 | Temp 98.2°F | Ht 60.0 in | Wt 200.0 lb

## 2018-09-09 DIAGNOSIS — J069 Acute upper respiratory infection, unspecified: Secondary | ICD-10-CM | POA: Insufficient documentation

## 2018-09-09 LAB — RESPIRATORY VIRUS PANEL
INFLUENZA B RNA: NOT DETECTED
Influenza A RNA: NOT DETECTED
RSV RNA: DETECTED — AB
hMPV: NOT DETECTED

## 2018-09-09 MED ORDER — AZITHROMYCIN 250 MG PO TABS: ORAL_TABLET | ORAL | 0 refills | Status: DC

## 2018-09-09 MED ORDER — LEVALBUTEROL HCL 0.63 MG/3ML IN NEBU
0.6300 mg | INHALATION_SOLUTION | Freq: Once | RESPIRATORY_TRACT | Status: AC
  Administered 2018-09-09: 0.63 mg via RESPIRATORY_TRACT

## 2018-09-09 NOTE — Progress Notes (Signed)
@Patient  ID: Lindsey Copeland, female    DOB: [birth year], 58 y.o.   MRN: [id number]  Chief Complaint  Patient presents with  . Shortness of Breath    with wheezing, coughing up dark yellow congestion. Hears crackling in her chest.    Referring provider: [name], MD  HPI 58yo female former smoker followed for Sarcoid and moderate OSA.  She is a patient of Dr. Vassie Loll.  Tests: PFTs 07/2012 nml lung volumes, no obs, DLCO 71% ACE 82  [facility] 2014 >Cytology neg for maligancy cells, nondx , cx neg (sputum/AFB /Fungal) PFT 10/2014 >>ratio 80, FVC 104%, FEV1 108%, DLCO 83%-normal CT chest 05/2013 Mediastinal/bilateral hilar lymphadenopathy, Peribronchovascular nodularity throughout all lobes, numerous lesions in the spleen. CXR 05/2015 decreased LNs HRCT chest >March 2019>very subtle changes in the lungs with a few scattered micronodules measuring 1 to 2 mm. No ILD. No notable adenopathy on noncontrasted CT.  Spirometry 08/2017 >normal  Home bat exposure attic / Histoplasmosis ag/ab  02/2018 >neg   OV 09/09/18 - Acute wheezing, nasal congestion Patient presents today for an acute visit.  She complains today of shortness of breath, wheezing, chest congestion, and nasal congestion.  States that her cough has been productive of yellow sputum.  Her symptoms started 3 days ago.  Her husband has been sick with the same symptoms.  They deny any recent travel.  Has tried Mucinex, Tylenol, and Delsym with minimal relief noted.  Patient states that she is compliant with her Symbicort.  States that she did not think to take her Proventil and has not used it in quite some time.  Patient states that she thinks she had a fever this morning but did not check her temperature.  She is afebrile in the office today.  Vital signs are stable in office today. Denies f/c/s, n/v/d, hemoptysis, PND, leg swelling.      Allergies  Allergen Reactions  . Cinnamon Shortness Of Breath  . Sulfa Antibiotics    hives  . Indomethacin Palpitations  . Robaxin [Methocarbamol] Palpitations    Immunization History  Administered Date(s) Administered  . Influenza Split 04/08/2013, 03/28/2017, 04/28/2018  . Influenza Whole 04/30/2012  . Influenza,inj,Quad PF,6+ Mos 03/27/2015, 04/18/2016  . Influenza-Unspecified 03/30/2014    Past Medical History:  Diagnosis Date  . Arthritis   . Asthma   . Carpal tunnel syndrome   . COPD (chronic obstructive pulmonary disease) (HCC)    sarcoidosis  . Dysrhythmia    palpitations  . GERD (gastroesophageal reflux disease)   . Graves disease   . Hyperlipidemia   . Hyperthyroidism   . Leaky heart valve   . Lumbar spinal stenosis   . Sarcoidosis   . Sleep apnea    CPAP  . Type 2 diabetes mellitus (HCC)   . Vitamin D deficiency     Tobacco History: Social History   Tobacco Use  Smoking Status Former Smoker  . Packs/day: 0.10  . Years: 10.00  . Pack years: 1.00  . Types: Cigarettes  . Last attempt to quit: 06/30/2002  . Years since quitting: 16.2  Smokeless Tobacco Never Used   Counseling given: Not Answered   Outpatient Encounter Medications as of 09/09/2018  Medication Sig  . acetaminophen (TYLENOL) 500 MG tablet Take 500 mg by mouth every 6 (six) hours as needed for mild pain or headache.   . albuterol (PROVENTIL HFA;VENTOLIN HFA) 108 (90 Base) MCG/ACT inhaler Inhale 2 puffs into the lungs every 6 (six) hours as needed for wheezing or shortness  of breath.  Marland Kitchen aspirin 81 MG tablet Take 81 mg by mouth daily.  Marland Kitchen atorvastatin (LIPITOR) 80 MG tablet Take 80 mg by mouth daily.  . Calcium Carbonate Antacid (TUMS E-X 750 PO) Take by mouth as needed.  . Cholecalciferol (VITAMIN D-3) 1000 UNITS CAPS Take 1 capsule by mouth daily.   . cyclobenzaprine (FLEXERIL) 10 MG tablet Take 1 tablet by mouth daily as needed.  . fluticasone (FLONASE) 50 MCG/ACT nasal spray Place 2 sprays into both nostrils daily as needed for allergies or rhinitis.  Marland Kitchen gabapentin  (NEURONTIN) 300 MG capsule Take by mouth.  Marland Kitchen ibuprofen (ADVIL,MOTRIN) 800 MG tablet Take 800 mg by mouth every 8 (eight) hours as needed.  Marland Kitchen ketoconazole (NIZORAL) 2 % cream Apply 1 application topically 2 (two) times daily.  . meloxicam (MOBIC) 15 MG tablet Take 15 mg by mouth daily.  . metFORMIN (GLUCOPHAGE) 500 MG tablet Take 1,000 mg by mouth every evening.   . Multiple Vitamin (MULTIVITAMIN) tablet Take 1 tablet by mouth daily.  Marland Kitchen omeprazole (PRILOSEC) 20 MG capsule Take 20 mg by mouth daily as needed (indigestion).   . SUMAtriptan (IMITREX) 50 MG tablet Take 1 tablet (50 mg total) by mouth every 2 (two) hours as needed for migraine. May repeat in 2 hours if headache persists or recurs.  . SYMBICORT 160-4.5 MCG/ACT inhaler USE 2 INHALATIONS TWICE A DAY  . venlafaxine XR (EFFEXOR-XR) 75 MG 24 hr capsule Take 1 capsule (75 mg total) by mouth daily.  Marland Kitchen azithromycin (ZITHROMAX) 250 MG tablet Take 2 tablets (500 mg) on day 1, then take 1 tablet (250 mg) on days 2-5  . [EXPIRED] levalbuterol (XOPENEX) nebulizer solution 0.63 mg    No facility-administered encounter medications on file as of 09/09/2018.      Review of Systems  Review of Systems  Constitutional: Positive for chills and fever.  HENT: Positive for congestion, postnasal drip, sinus pressure and sinus pain.   Respiratory: Positive for cough, shortness of breath and wheezing.   Cardiovascular: Negative.  Negative for chest pain, palpitations and leg swelling.  Gastrointestinal: Negative.   Allergic/Immunologic: Negative.   Neurological: Negative.   Psychiatric/Behavioral: Negative.        Physical Exam  BP 116/68 (BP Location: Right Arm, Patient Position: Sitting, Cuff Size: Large)   Pulse 76   Temp 98.2 F (36.8 C) Comment: Took Tylenol about 4:00 am  Ht 5' (1.524 m)   Wt 200 lb (90.7 kg)   SpO2 97%   BMI 39.06 kg/m   Wt Readings from Last 5 Encounters:  09/09/18 200 lb (90.7 kg)  09/07/18 200 lb 8 oz (90.9 kg)   06/18/18 193 lb 12.8 oz (87.9 kg)  06/09/18 195 lb (88.5 kg)  04/14/18 192 lb (87.1 kg)     Physical Exam Vitals signs and nursing note reviewed.  Constitutional:      General: She is not in acute distress.    Appearance: She is well-developed.  HENT:     Nose:     Right Sinus: Maxillary sinus tenderness and frontal sinus tenderness present.     Left Sinus: Maxillary sinus tenderness and frontal sinus tenderness present.     Mouth/Throat:     Pharynx: Posterior oropharyngeal erythema present. No oropharyngeal exudate.  Cardiovascular:     Rate and Rhythm: Normal rate and regular rhythm.  Pulmonary:     Effort: Pulmonary effort is normal.     Breath sounds: Normal breath sounds. No decreased breath sounds, wheezing or  rhonchi.     Comments: Congested cough noted.  Musculoskeletal:     Right lower leg: No edema.  Neurological:     Mental Status: She is alert and oriented to person, place, and time.       Assessment & Plan:   Upper respiratory tract infection Patient presents today for an acute visit.  She complains today of shortness of breath, wheezing, chest congestion, and nasal congestion.  States that her cough has been productive of yellow sputum.  Her symptoms started 3 days ago.  Her husband has been sick with the same symptoms.  They deny any recent travel. Patient states that she is compliant with her Symbicort.  States that she did not think to take her Proventil and has not used it in quite some time.  Patient states that she thinks she had a fever this morning but did not check her temperature.  We will check viral respiratory panel today.  Will order Tamiflu if indicated.  We will treat with azithromycin for URI.  Will hold on prednisone at this time.  Patient states that she recently saw her optometrist and was told to avoid steroids due to increased pressure in eyes.  Patient Instructions  Will order azithromycin Will check respiratory viral panel and call with  results Continue symbicort Continue proventil as needed  Follow up with Dr. Vassie Loll or me in 1 week or sooner if needed        Ivonne Andrew, NP 09/09/2018

## 2018-09-09 NOTE — Assessment & Plan Note (Addendum)
Patient presents today for an acute visit.  She complains today of shortness of breath, wheezing, chest congestion, and nasal congestion.  States that her cough has been productive of yellow sputum.  Her symptoms started 3 days ago.  Her husband has been sick with the same symptoms.  They deny any recent travel. Patient states that she is compliant with her Symbicort.  States that she did not think to take her Proventil and has not used it in quite some time.  Patient states that she thinks she had a fever this morning but did not check her temperature.  We will check viral respiratory panel today.  Will order Tamiflu if indicated.  We will treat with azithromycin for URI.  Will hold on prednisone at this time.  Patient states that she recently saw her optometrist and was told to avoid steroids due to increased pressure in eyes.  Patient Instructions  Will order azithromycin Will check respiratory viral panel and call with results Continue symbicort Continue proventil as needed  Follow up with Dr. Vassie Loll or me in 1 week or sooner if needed

## 2018-09-09 NOTE — Patient Instructions (Addendum)
Will order azithromycin Will check respiratory viral panel and call with results Continue symbicort Continue proventil as needed  Follow up with Dr. Vassie Loll or me in 1 week or sooner if needed

## 2018-09-16 ENCOUNTER — Other Ambulatory Visit: Payer: Self-pay

## 2018-09-16 ENCOUNTER — Ambulatory Visit (INDEPENDENT_AMBULATORY_CARE_PROVIDER_SITE_OTHER)

## 2018-09-16 ENCOUNTER — Ambulatory Visit (INDEPENDENT_AMBULATORY_CARE_PROVIDER_SITE_OTHER): Admitting: Nurse Practitioner

## 2018-09-16 ENCOUNTER — Encounter: Payer: Self-pay | Admitting: Nurse Practitioner

## 2018-09-16 VITALS — BP 106/62 | HR 83 | Ht 60.0 in | Wt 197.2 lb

## 2018-09-16 DIAGNOSIS — R0602 Shortness of breath: Secondary | ICD-10-CM

## 2018-09-16 DIAGNOSIS — J069 Acute upper respiratory infection, unspecified: Secondary | ICD-10-CM

## 2018-09-16 MED ORDER — PREDNISONE 10 MG PO TABS: 20.0000 mg | ORAL_TABLET | Freq: Every day | ORAL | 0 refills | Status: AC

## 2018-09-16 MED ORDER — ALBUTEROL SULFATE HFA 108 (90 BASE) MCG/ACT IN AERS: 2.0000 | INHALATION_SPRAY | Freq: Four times a day (QID) | RESPIRATORY_TRACT | 3 refills | Status: DC | PRN

## 2018-09-16 MED ORDER — FLUTICASONE PROPIONATE 50 MCG/ACT NA SUSP: 2.0000 | Freq: Every day | NASAL | 3 refills | Status: AC | PRN

## 2018-09-16 MED ORDER — ALBUTEROL SULFATE (2.5 MG/3ML) 0.083% IN NEBU: 2.5000 mg | INHALATION_SOLUTION | Freq: Four times a day (QID) | RESPIRATORY_TRACT | 6 refills | Status: DC | PRN

## 2018-09-16 NOTE — Patient Instructions (Addendum)
Will check chest x ray and call with results Will order prednisone May take mucinex twice daily Continue Flonase Continue Proventil as needed Will order nebulizer as needed Continue Symbicort   Follow up with Dr. Vassie Loll in 2 months or sooner if needed

## 2018-09-16 NOTE — Addendum Note (Signed)
Addended by: Ander Slade on: 09/16/2018 03:30 PM   Modules accepted: Orders

## 2018-09-16 NOTE — Progress Notes (Signed)
  ID: Lindsey Copeland, female    DOB: Jun 21, 1961, 58 y.o.   MRN: [id number]  Chief Complaint  Patient presents with  . Wheezing    remains after treatment for upper respiratory tract infection. Has completed the antibiotic issued at last visit.    Referring provider: [name], MD  HPI 58yo female former smoker followed for Sarcoid and moderate OSA.  She is a patient of Dr. Vassie Loll.  Tests/Recent events: PFTs 07/2012 nml lung volumes, no obs, DLCO 71% ACE 82  [facility] 2014 >Cytology neg for maligancy cells, nondx , cx neg (sputum/AFB /Fungal) PFT 10/2014 >>ratio 80, FVC 104%, FEV1 108%, DLCO 83%-normal CT chest 05/2013 Mediastinal/bilateral hilar lymphadenopathy, Peribronchovascular nodularity throughout all lobes, numerous lesions in the spleen. CXR 05/2015 decreased LNs HRCT chest >March 2019>very subtle changes in the lungs with a few scattered micronodules measuring 1 to 2 mm. No ILD. No notable adenopathy on noncontrasted CT.  Spirometry 08/2017 >normal  Home bat exposure attic / Histoplasmosis ag/ab 02/2018 >neg   Last OV 09/09/18 - Acute wheezing, nasal congestion Patient presents today for an acute visit.  She complains today of shortness of breath, wheezing, chest congestion, and nasal congestion.  States that her cough has been productive of yellow sputum.  Her symptoms started 3 days ago.  Her husband has been sick with the same symptoms.  They deny any recent travel.  Has tried Mucinex, Tylenol, and Delsym with minimal relief noted.  Patient states that she is compliant with her Symbicort.  States that she did not think to take her Proventil and has not used it in quite some time.  Patient states that she thinks she had a fever this morning but did not check her temperature.  She is afebrile in the office today.  Vital signs are stable in office today. Denies f/c/s, n/v/d, hemoptysis, PND, leg swelling.  Plan: Will order azithromycin Will check respiratory  viral panel and call with results Continue symbicort Continue proventil as needed  OV 09/16/18 - Follow up Patient presents for follow-up visit today.  She was last seen by me on 09/09/2018 for upper respiratory tract infection.  She has completed a course of azithromycin.  She is compliant with Symbicort and Proventil as needed.  She states that she feels much better but is still has a lingering cough and wheezing.  She has been using Flonase.  She denies any recent fever.  At last visit she refused prednisone due to recent increased pressure in her eyes. Denies f/c/s, n/v/d, hemoptysis, PND, leg swelling.    Allergies  Allergen Reactions  . Cinnamon Shortness Of Breath  . Sulfa Antibiotics     hives  . Indomethacin Palpitations  . Robaxin [Methocarbamol] Palpitations    Immunization History  Administered Date(s) Administered  . Influenza Split 04/08/2013, 03/28/2017, 04/28/2018  . Influenza Whole 04/30/2012  . Influenza,inj,Quad PF,6+ Mos 03/27/2015, 04/18/2016  . Influenza-Unspecified 03/30/2014    Past Medical History:  Diagnosis Date  . Arthritis   . Asthma   . Carpal tunnel syndrome   . COPD (chronic obstructive pulmonary disease) (HCC)    sarcoidosis  . Dysrhythmia    palpitations  . GERD (gastroesophageal reflux disease)   . Graves disease   . Hyperlipidemia   . Hyperthyroidism   . Leaky heart valve   . Lumbar spinal stenosis   . Sarcoidosis   . Sleep apnea    CPAP  . Type 2 diabetes mellitus (HCC)   . Vitamin D deficiency  Tobacco History: Social History   Tobacco Use  Smoking Status Former Smoker  . Packs/day: 0.10  . Years: 10.00  . Pack years: 1.00  . Types: Cigarettes  . Last attempt to quit: 06/30/2002  . Years since quitting: 16.2  Smokeless Tobacco Never Used   Counseling given: Yes   Outpatient Encounter Medications as of 09/16/2018  Medication Sig  . acetaminophen (TYLENOL) 500 MG tablet Take 500 mg by mouth every 6 (six) hours as  needed for mild pain or headache.   . albuterol (PROVENTIL HFA;VENTOLIN HFA) 108 (90 Base) MCG/ACT inhaler Inhale 2 puffs into the lungs every 6 (six) hours as needed for wheezing or shortness of breath.  Marland Kitchen aspirin 81 MG tablet Take 81 mg by mouth daily.  Marland Kitchen atorvastatin (LIPITOR) 80 MG tablet Take 80 mg by mouth daily.  . Calcium Carbonate Antacid (TUMS E-X 750 PO) Take by mouth as needed.  . Cholecalciferol (VITAMIN D-3) 1000 UNITS CAPS Take 1 capsule by mouth daily.   . cyclobenzaprine (FLEXERIL) 10 MG tablet Take 1 tablet by mouth daily as needed.  . fluticasone (FLONASE) 50 MCG/ACT nasal spray Place 2 sprays into both nostrils daily as needed for allergies or rhinitis.  Marland Kitchen gabapentin (NEURONTIN) 300 MG capsule Take by mouth.  Marland Kitchen ibuprofen (ADVIL,MOTRIN) 800 MG tablet Take 800 mg by mouth every 8 (eight) hours as needed.  Marland Kitchen ketoconazole (NIZORAL) 2 % cream Apply 1 application topically 2 (two) times daily.  . meloxicam (MOBIC) 15 MG tablet Take 15 mg by mouth daily.  . metFORMIN (GLUCOPHAGE) 500 MG tablet Take 1,000 mg by mouth every evening.   . Multiple Vitamin (MULTIVITAMIN) tablet Take 1 tablet by mouth daily.  Marland Kitchen omeprazole (PRILOSEC) 20 MG capsule Take 20 mg by mouth daily as needed (indigestion).   . SUMAtriptan (IMITREX) 50 MG tablet Take 1 tablet (50 mg total) by mouth every 2 (two) hours as needed for migraine. May repeat in 2 hours if headache persists or recurs.  . SYMBICORT 160-4.5 MCG/ACT inhaler USE 2 INHALATIONS TWICE A DAY  . venlafaxine XR (EFFEXOR-XR) 75 MG 24 hr capsule Take 1 capsule (75 mg total) by mouth daily.  . [DISCONTINUED] albuterol (PROVENTIL HFA;VENTOLIN HFA) 108 (90 Base) MCG/ACT inhaler Inhale 2 puffs into the lungs every 6 (six) hours as needed for wheezing or shortness of breath.  . [DISCONTINUED] albuterol (PROVENTIL HFA;VENTOLIN HFA) 108 (90 Base) MCG/ACT inhaler Inhale 2 puffs into the lungs every 6 (six) hours as needed for wheezing or shortness of breath.   . [DISCONTINUED] fluticasone (FLONASE) 50 MCG/ACT nasal spray Place 2 sprays into both nostrils daily as needed for allergies or rhinitis.  . predniSONE (DELTASONE) 10 MG tablet Take 2 tablets (20 mg total) by mouth daily with breakfast for 5 days.  . [DISCONTINUED] azithromycin (ZITHROMAX) 250 MG tablet Take 2 tablets (500 mg) on day 1, then take 1 tablet (250 mg) on days 2-5   No facility-administered encounter medications on file as of 09/16/2018.      Review of Systems  Review of Systems  Constitutional: Negative.  Negative for chills and fever.  HENT: Negative.   Respiratory: Positive for cough and wheezing. Negative for shortness of breath.   Cardiovascular: Negative.  Negative for chest pain, palpitations and leg swelling.  Gastrointestinal: Negative.   Allergic/Immunologic: Negative.   Neurological: Negative.   Psychiatric/Behavioral: Negative.        Physical Exam  BP 106/62 (BP Location: Left Arm, Patient Position: Sitting, Cuff Size: Normal)  Pulse 83   Ht 5' (1.524 m)   Wt 197 lb 3.2 oz (89.4 kg)   SpO2 99%   BMI 38.51 kg/m   Wt Readings from Last 5 Encounters:  09/16/18 197 lb 3.2 oz (89.4 kg)  09/09/18 200 lb (90.7 kg)  09/07/18 200 lb 8 oz (90.9 kg)  06/18/18 193 lb 12.8 oz (87.9 kg)  06/09/18 195 lb (88.5 kg)     Physical Exam Vitals signs and nursing note reviewed.  Constitutional:      General: She is not in acute distress.    Appearance: She is well-developed.  Cardiovascular:     Rate and Rhythm: Normal rate and regular rhythm.  Pulmonary:     Effort: Pulmonary effort is normal. No respiratory distress.     Breath sounds: Normal breath sounds. No wheezing or rhonchi.  Musculoskeletal:        General: No swelling.  Neurological:     Mental Status: She is alert and oriented to person, place, and time.     Imaging: Dg Chest 2 View  Result Date: 09/16/2018 CLINICAL DATA:  Productive cough. EXAM: CHEST - 2 VIEW COMPARISON:  03/16/2018.  FINDINGS: Mediastinum and hilar structures normal. Heart size normal. No focal infiltrate. No pleural effusion or pneumothorax. Thoracic spine degenerative changes scoliosis. IMPRESSION: No acute cardiopulmonary disease. Electronically Signed   By: Maisie Fus  Register   On: 09/16/2018 12:08     Assessment & Plan:   Upper respiratory tract infection Patient presents for follow-up visit today.  She was last seen by me on 09/09/2018 for upper respiratory tract infection.  She has completed a course of azithromycin.  She is compliant with Symbicort and Proventil as needed.  She states that she feels much better but is still has a lingering cough and wheezing.  She has been using Flonase.  She denies any recent fever.  At last visit she refused prednisone due to recent increased pressure in her eyes.  She states that she would like to try the prednisone to help relieve her cough and wheezing.  Advised her to call her eye doctor and make them aware that she will be starting prednisone.  Patient Instructions  Will check chest x ray and call with results Will order prednisone May take mucinex twice daily Continue Flonase Continue Proventil as needed Will order nebulizer as needed Continue Symbicort   Follow up with Dr. Vassie Loll in 2 months or sooner if needed        Ivonne Andrew, NP 09/16/2018

## 2018-09-16 NOTE — Assessment & Plan Note (Signed)
Patient presents for follow-up visit today.  She was last seen by me on 09/09/2018 for upper respiratory tract infection.  She has completed a course of azithromycin.  She is compliant with Symbicort and Proventil as needed.  She states that she feels much better but is still has a lingering cough and wheezing.  She has been using Flonase.  She denies any recent fever.  At last visit she refused prednisone due to recent increased pressure in her eyes.  She states that she would like to try the prednisone to help relieve her cough and wheezing.  Advised her to call her eye doctor and make them aware that she will be starting prednisone.  Patient Instructions  Will check chest x ray and call with results Will order prednisone May take mucinex twice daily Continue Flonase Continue Proventil as needed Will order nebulizer as needed Continue Symbicort   Follow up with Dr. Vassie Loll in 2 months or sooner if needed

## 2018-09-17 ENCOUNTER — Telehealth: Payer: Self-pay | Admitting: Nurse Practitioner

## 2018-09-17 NOTE — Telephone Encounter (Signed)
Notes recorded by Ivonne Andrew, NP on 09/09/2018 at 2:10 PM EDT Please call to let patient know that her panel did come back positive for RSV. This is a respiratory virus. If her symptoms worsen over the weekend please go to the ED. ____________________________________________________  Lindsey Copeland and spoke with patient, she has been made aware of results. Patient verbalized understanding and had no further questions or concerns. Nothing further needed.

## 2018-09-21 ENCOUNTER — Ambulatory Visit: Payer: Self-pay | Admitting: Pulmonary Disease

## 2018-09-29 ENCOUNTER — Telehealth: Payer: Self-pay

## 2018-09-29 NOTE — Telephone Encounter (Signed)
Contacted patient to change appointment in May due to covid19 rescheduling.  Patient agreed to change and recall for appointment placed.  Patient also asked if she could get results of recent cxr.  Do not see result note and will forward to Angus Seller NP and call patient with results as soon as final review.  Lindsey Copeland could you please review so we can contact patient?  Thank you

## 2018-09-29 NOTE — Telephone Encounter (Signed)
Spoke with Angus Seller, NP by phone.  Read CXR report to Angus Seller, NP over the phone who verified report as normal. Patient notified by phone of normal reading and Archie Patten will review and sign off tomorrow.  Patient had no further questions and nothing further needed.

## 2018-10-07 ENCOUNTER — Other Ambulatory Visit: Payer: Self-pay | Admitting: Family Medicine

## 2018-10-07 ENCOUNTER — Other Ambulatory Visit (HOSPITAL_COMMUNITY)
Admission: RE | Admit: 2018-10-07 | Discharge: 2018-10-07 | Disposition: A | Discharge status: Home / Self Care | Source: Ambulatory Visit | Attending: Family Medicine | Admitting: Family Medicine

## 2018-10-07 DIAGNOSIS — Z124 Encounter for screening for malignant neoplasm of cervix: Secondary | ICD-10-CM | POA: Insufficient documentation

## 2018-10-12 LAB — CYTOLOGY - PAP
Diagnosis: NEGATIVE
HPV: NOT DETECTED

## 2018-11-03 ENCOUNTER — Ambulatory Visit (INDEPENDENT_AMBULATORY_CARE_PROVIDER_SITE_OTHER): Admitting: Licensed Clinical Social Worker

## 2018-11-03 ENCOUNTER — Encounter: Payer: Self-pay | Admitting: Internal Medicine

## 2018-11-03 DIAGNOSIS — F3341 Major depressive disorder, recurrent, in partial remission: Secondary | ICD-10-CM

## 2018-11-16 ENCOUNTER — Ambulatory Visit: Payer: Self-pay | Admitting: Pulmonary Disease

## 2018-12-28 ENCOUNTER — Ambulatory Visit (INDEPENDENT_AMBULATORY_CARE_PROVIDER_SITE_OTHER): Payer: Medicare Other | Admitting: Licensed Clinical Social Worker

## 2018-12-28 DIAGNOSIS — F3341 Major depressive disorder, recurrent, in partial remission: Secondary | ICD-10-CM

## 2019-02-01 ENCOUNTER — Telehealth: Payer: Self-pay | Admitting: Nurse Practitioner

## 2019-02-01 NOTE — Telephone Encounter (Signed)
Patient has had 2 follow up appt cancel and recall was for 11/2018 so made her appt with Dr. Elsworth Soho on 02/03/19 at 1400. Patient's mail order will not be able to send medication for about 5+ days so patient will need samples. She thinks she has enough to get to her appt on Thursday but I told her to call us back if she doesn't.   Nothing further needed at this time.

## 2019-02-03 ENCOUNTER — Ambulatory Visit (INDEPENDENT_AMBULATORY_CARE_PROVIDER_SITE_OTHER): Payer: Medicare Other | Admitting: Pulmonary Disease

## 2019-02-03 ENCOUNTER — Other Ambulatory Visit: Payer: Self-pay

## 2019-02-03 ENCOUNTER — Encounter: Payer: Self-pay | Admitting: Pulmonary Disease

## 2019-02-03 DIAGNOSIS — D862 Sarcoidosis of lung with sarcoidosis of lymph nodes: Secondary | ICD-10-CM

## 2019-02-03 DIAGNOSIS — G4733 Obstructive sleep apnea (adult) (pediatric): Secondary | ICD-10-CM | POA: Diagnosis not present

## 2019-02-03 MED ORDER — BUDESONIDE-FORMOTEROL FUMARATE 160-4.5 MCG/ACT IN AERO: 2.0000 | INHALATION_SPRAY | Freq: Two times a day (BID) | RESPIRATORY_TRACT | 0 refills | Status: DC

## 2019-02-03 NOTE — Patient Instructions (Signed)
Stay on Symbicort Prescription will be sent for auto CPAP machine 8 to 15 cm DME

## 2019-02-03 NOTE — Addendum Note (Signed)
Addended by: Joella Prince on: 02/03/2019 04:17 PM   Modules accepted: Orders

## 2019-02-03 NOTE — Assessment & Plan Note (Signed)
Appears stable, last chest x-ray from 08/2018 was reviewed, will defer PFTs at this time She is maintained on Symbicort for mild asthma type symptoms and we will continue this

## 2019-02-03 NOTE — Addendum Note (Signed)
Addended by: Joella Prince on: 02/03/2019 03:13 PM   Modules accepted: Orders

## 2019-02-03 NOTE — Assessment & Plan Note (Signed)
She is compliant with her CPAP machine necessarily helped him toward symptoms of daytime somnolence and fatigue. We will send in prescription for new auto CPAP 8 to 15 cm

## 2019-02-03 NOTE — Progress Notes (Signed)
   Subjective:    Patient ID: Lindsey Copeland, female    DOB: [birth year], 58 y.o.   MRN: [id number]  HPI  58 yo former smoker followed for Sarcoid and moderate OSA She had asthma as a child, outgrew it and then symptoms returned after a chest cold in winter of 2013. She has Graves' disease status post radioactive iodine.  She was restarted on CPAP in 2017 after an initial failure in 2014 She had esophageal dysmotility and underwent esophageal dilatation by GI   Chief Complaint  Patient presents with  . Follow-up    improving - no cough or congestion    She is here for routine follow-up visit.  She was diagnosed with RSV in March but subsequently has done well from a breathing standpoint.  She is compliant with Symbicort and would like some samples while her mail order prescription comes through.  She denies cough or wheezing. She is compliant with social distancing and wears a mask all the time even if she goes outdoors for a little bit. She is compliant with her CPAP machine and denies pressure or dryness issues.  She wonders if she can get a new machine she has maintained on auto CPAP 5 to 15 cm with good compliance  She was diagnosed with visual migraines last year.   Significant tests/ events Esophagram 12/2017 >>  Dysmotility & stasis >> referred to GI  01/2013 PSG -wt 190 pounds, BMI 34 showed predominant hypopneas during supine sleep and with AHI of 16 events per hour. Nonsupine AHI was only 3 events per hour.   PFTs 07/2012 nml lung volumes, no obs, DLCO 71% PFT 10/2014 >>ratio 80, FVC 104%, FEV1 108%, DLCO 83%-normal 06/2017 spiro >> nml, FVC 92%   CT chest 05/2013 Mediastinal/bilateral hilar lymphadenopathy, Peribronchovascular nodularity throughout all lobes, numerous lesions in the spleen. HRCT 08/2017 >> decreased lymphadenopathy, mild 1 to 2 mm nodules along fissures consistent with sarcoidosis  Echo 09/2017 grade 1 DD  Review of Systems neg for any  significant sore throat, dysphagia, itching, sneezing, nasal congestion or excess/ purulent secretions, fever, chills, sweats, unintended wt loss, pleuritic or exertional cp, hempoptysis, orthopnea pnd or change in chronic leg swelling. Also denies presyncope, palpitations, heartburn, abdominal pain, nausea, vomiting, diarrhea or change in bowel or urinary habits, dysuria,hematuria, rash, arthralgias, visual complaints, headache, numbness weakness or ataxia.     Objective:   Physical Exam   Gen. Pleasant, obese, in no distress ENT - no lesions, no post nasal drip Neck: No JVD, no thyromegaly, no carotid bruits Lungs: no use of accessory muscles, no dullness to percussion, decreased without rales or rhonchi  Cardiovascular: Rhythm regular, heart sounds  normal, no murmurs or gallops, no peripheral edema Musculoskeletal: No deformities, no cyanosis or clubbing , no tremors        Assessment & Plan:

## 2019-03-08 ENCOUNTER — Ambulatory Visit (INDEPENDENT_AMBULATORY_CARE_PROVIDER_SITE_OTHER): Payer: Medicare Other | Admitting: Licensed Clinical Social Worker

## 2019-03-08 ENCOUNTER — Ambulatory Visit: Payer: Medicare Other | Admitting: Licensed Clinical Social Worker

## 2019-03-08 DIAGNOSIS — F324 Major depressive disorder, single episode, in partial remission: Secondary | ICD-10-CM | POA: Diagnosis not present

## 2019-04-12 ENCOUNTER — Telehealth: Payer: Self-pay | Admitting: Pulmonary Disease

## 2019-04-12 NOTE — Telephone Encounter (Signed)
Called and spoke w/ pt. Pt states she received a call from her mail order pharmacy on 04/09/2019 that her Symbicort 160 would be changed to the generic version budesonide-formoterol. Pt inquired if this was ok bc she had not heard about this change from [place]. I let her know her pharmacy may have changed her to generic for insurance coverage purposes; however, as long as it is budesonide-formoterol 160 there is no difference and she could continue taking the medication as instructed by Dr. Elsworth Soho. Pt verbalized understanding with no additional questions. Nothing further needed at this time.

## 2019-04-28 ENCOUNTER — Telehealth: Payer: Self-pay | Admitting: Pulmonary Disease

## 2019-04-28 NOTE — Telephone Encounter (Signed)
LMTCB

## 2019-05-02 MED ORDER — SYMBICORT 160-4.5 MCG/ACT IN AERO: 2.0000 | INHALATION_SPRAY | Freq: Two times a day (BID) | RESPIRATORY_TRACT | 4 refills | Status: DC

## 2019-05-02 NOTE — Telephone Encounter (Signed)
Spoke with pt. She is requesting a new prescription for brand name Symbicort. Rx has been sent in. Nothing further was needed.

## 2019-05-19 ENCOUNTER — Telehealth: Payer: Self-pay | Admitting: Nurse Practitioner

## 2019-05-19 DIAGNOSIS — R0602 Shortness of breath: Secondary | ICD-10-CM

## 2019-05-19 DIAGNOSIS — J069 Acute upper respiratory infection, unspecified: Secondary | ICD-10-CM

## 2019-05-19 NOTE — Telephone Encounter (Addendum)
I looked downstairs on the fax machine and did not see the paperwork. Will check again later.

## 2019-05-24 NOTE — Telephone Encounter (Signed)
LMTCB

## 2019-05-25 NOTE — Telephone Encounter (Signed)
I have checked the faxes and Dr. Bari Mantis box and do not see any paperwork.

## 2019-05-30 MED ORDER — ALBUTEROL SULFATE (2.5 MG/3ML) 0.083% IN NEBU: 2.5000 mg | INHALATION_SOLUTION | Freq: Four times a day (QID) | RESPIRATORY_TRACT | 6 refills | Status: DC | PRN

## 2019-05-30 NOTE — Telephone Encounter (Signed)
Call returned to [place], confirmed patient info, spoke with [place]. They need a prescription. Pt requested a nebulizer solution refill of albuterol. Refill sent.   Nothing further needed at this time.

## 2019-06-03 ENCOUNTER — Telehealth: Payer: Self-pay | Admitting: Pulmonary Disease

## 2019-06-03 NOTE — Telephone Encounter (Signed)
I called [facility] and they are checking on the status of the Rx for albuterol. I advised the pharmacist to re-fax the Rx to me so I can have Dr. Elsworth Soho sign.

## 2019-06-06 NOTE — Telephone Encounter (Signed)
LMTCB

## 2019-06-08 NOTE — Telephone Encounter (Signed)
Lindsey Copeland from [facility] checking the status of the forms faxed to [place] 06/03/2019.  Lindsey Copeland phone number is [phone].

## 2019-06-08 NOTE — Telephone Encounter (Signed)
No [place] papers up front at this time or in C pod  Hinton Dyer- did you by chance collect them and see anything on this pt  Please advise and if not we can call back and ask that they fax again, thanks!

## 2019-06-09 ENCOUNTER — Telehealth: Payer: Self-pay | Admitting: Pulmonary Disease

## 2019-06-09 DIAGNOSIS — J069 Acute upper respiratory infection, unspecified: Secondary | ICD-10-CM

## 2019-06-09 DIAGNOSIS — R0602 Shortness of breath: Secondary | ICD-10-CM

## 2019-06-09 MED ORDER — ALBUTEROL SULFATE (2.5 MG/3ML) 0.083% IN NEBU: 2.5000 mg | INHALATION_SOLUTION | Freq: Four times a day (QID) | RESPIRATORY_TRACT | 6 refills | Status: DC | PRN

## 2019-06-09 NOTE — Telephone Encounter (Signed)
Signed form faxed back to [facility] fax # [phone]. Confirmation received. Signed form sent to medical records to be scanned into chart. Nothing further needed at this time.

## 2019-06-09 NOTE — Telephone Encounter (Signed)
Spoke with the pharmacist from [facility]  And tghey need a new Rx for the albuterol nebulizer medication without [name]'s name on it and a medication list faxed to the number below. I will have Dr. Elsworth Soho sign the Rx and then fax to the number listed. Nothing further is needed.   Fax [phone]

## 2019-06-09 NOTE — Telephone Encounter (Signed)
Fax received, placed in Dr. Bari Mantis to be signed folder. When returned with signature will fax back.

## 2019-06-09 NOTE — Telephone Encounter (Signed)
Lindsey Copeland, did you receive any forms on this pt or do we need to get them to refax?

## 2019-06-10 ENCOUNTER — Telehealth: Payer: Self-pay | Admitting: Pulmonary Disease

## 2019-06-10 NOTE — Telephone Encounter (Signed)
Signed prescription was sent to [facility] 06/03/19 with confirmation received. I called [facility] and was told the prescription needs a different diagnosis code to be processed. The one used is not accepted by insurance.  Advised I will look out for the fax as I have not received it to give to Dr. Elsworth Soho to sign.

## 2019-06-10 NOTE — Telephone Encounter (Signed)
See telephone note dated 06/03/19. The signed paperwork was faxed back to [place] and confirmation was received.

## 2019-06-10 NOTE — Telephone Encounter (Signed)
Lindsey Copeland is currently working with Dr. Elsworth Soho.  Lindsey Copeland, please advise if you have received a form on pt?

## 2019-06-13 NOTE — Telephone Encounter (Signed)
Fax received diagnosis code of D86.2 written. Form placed in Dr. Bari Mantis to be signed folder. When returned will fax back to [place].

## 2019-06-14 ENCOUNTER — Telehealth: Payer: Self-pay | Admitting: Pulmonary Disease

## 2019-06-14 NOTE — Telephone Encounter (Signed)
[place] and verified pt's dx.  Nothing further needed at this time- will close encounter.

## 2019-06-14 NOTE — Telephone Encounter (Signed)
Jessica at [facility] called back, stating that insurance won't cover albuterol with a dx of sarcoidosis.  In 8/6 office visit note it shows that pt is on Symbicort for her mild asthma-like symptoms.  Janett Billow is faxing a rx to our office with a dx of mild asthma to be signed for insurance purposes.    Forwarding to [place] as FYI/to follow up on.

## 2019-06-15 ENCOUNTER — Telehealth: Payer: Self-pay | Admitting: Pulmonary Disease

## 2019-06-15 NOTE — Telephone Encounter (Signed)
Please change diagnosis to asthma

## 2019-06-15 NOTE — Telephone Encounter (Signed)
Message sent to Dr. Elsworth Soho to try an obtain alternative diagnosis codes that can be used in order for the patient to get what she needs. I still have the fax Dr. Elsworth Soho signed with his initial next to the new code. Waiting on response from Dr. Elsworth Soho.

## 2019-06-15 NOTE — Telephone Encounter (Signed)
Dr. Elsworth Soho the faxed request from [place] had the updated diagnosis of D86.2 for Sarcoidosis, which her insurance does not accept either.   Can we use pulmonary fibrosis, chronic bronchitis, bronchiectasis?

## 2019-06-15 NOTE — Telephone Encounter (Signed)
I don't anything for this patient but if I am not mistaken there is a form in Dr. Bari Mantis box up front

## 2019-06-15 NOTE — Telephone Encounter (Signed)
Placed code J45.998 (asthma), form refaxed to [phone].

## 2019-06-15 NOTE — Telephone Encounter (Signed)
Lindsey Piety, do you have the new CMN for albuterol under asthma dx?

## 2019-06-15 NOTE — Telephone Encounter (Signed)
Dr. Elsworth Soho is out of the office until 06/27/2019.  I have located the CMN in his file up front. Will get it signed and faxed back to [facility] for pt. Nothing further needed.

## 2019-06-21 ENCOUNTER — Ambulatory Visit: Payer: Medicare Other | Admitting: Licensed Clinical Social Worker

## 2019-07-12 ENCOUNTER — Ambulatory Visit (INDEPENDENT_AMBULATORY_CARE_PROVIDER_SITE_OTHER): Payer: Medicare Other | Admitting: Licensed Clinical Social Worker

## 2019-07-12 DIAGNOSIS — F324 Major depressive disorder, single episode, in partial remission: Secondary | ICD-10-CM

## 2019-08-08 ENCOUNTER — Other Ambulatory Visit: Payer: Self-pay

## 2019-08-08 ENCOUNTER — Ambulatory Visit (INDEPENDENT_AMBULATORY_CARE_PROVIDER_SITE_OTHER): Payer: Medicare Other | Admitting: Adult Health

## 2019-08-08 ENCOUNTER — Encounter: Payer: Self-pay | Admitting: Adult Health

## 2019-08-08 DIAGNOSIS — G4733 Obstructive sleep apnea (adult) (pediatric): Secondary | ICD-10-CM | POA: Diagnosis not present

## 2019-08-08 DIAGNOSIS — D862 Sarcoidosis of lung with sarcoidosis of lymph nodes: Secondary | ICD-10-CM | POA: Diagnosis not present

## 2019-08-08 MED ORDER — SYMBICORT 160-4.5 MCG/ACT IN AERO: 2.0000 | INHALATION_SPRAY | Freq: Two times a day (BID) | RESPIRATORY_TRACT | 4 refills | Status: DC

## 2019-08-08 MED ORDER — ALBUTEROL SULFATE HFA 108 (90 BASE) MCG/ACT IN AERS: 2.0000 | INHALATION_SPRAY | Freq: Four times a day (QID) | RESPIRATORY_TRACT | 0 refills | Status: DC | PRN

## 2019-08-08 NOTE — Patient Instructions (Addendum)
Continue on CPAP At bedtime   Work on healthy weight Do not drive if sleepy Continue on Symbicort twice daily.  Follow-up with Dr. Vassie Loll or Andrea Ferrer NP in 6 months and As needed   Please contact office for sooner follow up if symptoms do not improve or worsen or seek emergency care

## 2019-08-08 NOTE — Assessment & Plan Note (Addendum)
Doing well on CPAP .   Plan  Patient Instructions  Continue on CPAP At bedtime   Work on healthy weight Do not drive if sleepy Continue on Symbicort twice daily.  Follow-up with Dr. Vassie Loll or Crystallynn Noorani NP in 6 months and As needed   Please contact office for sooner follow up if symptoms do not improve or worsen or seek emergency care

## 2019-08-08 NOTE — Assessment & Plan Note (Addendum)
Stable - CXR last 08/2018 was clear.   Plan  Patient Instructions  Continue on CPAP At bedtime   Work on healthy weight Do not drive if sleepy Continue on Symbicort twice daily.  Follow-up with Dr. Vassie Loll or Rhylei Mcquaig NP in 6 months and As needed   Please contact office for sooner follow up if symptoms do not improve or worsen or seek emergency care

## 2019-08-08 NOTE — Assessment & Plan Note (Signed)
-   Weight loss 

## 2019-08-08 NOTE — Progress Notes (Addendum)
@Patient  ID: , female    DOB: March 20, 1961, 59 y.o.   MRN: [id number]  Chief Complaint  Patient presents with  . Follow-up    Sarcoid    Referring provider: [id number], MD  HPI: 59 year old female former smoker followed for sarcoidosis and moderate obstructive sleep apnea Medical history significant for Graves' disease status post radioactive iodine, esophageal dysmotility previous esophageal dilatation by GI  TEST/EVENTS :  Esophagram 12/2017 >>  Dysmotility & stasis >> referred to GI  01/2013 PSG -wt 190 pounds, BMI 34 showed predominant hypopneas during supine sleep and with AHI of 16 events per hour. Nonsupine AHI was only 3 events per hour.   PFTs 07/2012 nml lung volumes, no obs, DLCO 71% PFT 10/2014 >>ratio 80, FVC 104%, FEV1 108%, DLCO 83%-normal 06/2017 spiro >> nml, FVC 92%   CT chest 05/2013 Mediastinal/bilateral hilar lymphadenopathy, Peribronchovascular nodularity throughout all lobes, numerous lesions in the spleen. HRCT 08/2017 >>decreased lymphadenopathy, mild 1 to 2 mm nodules along fissures consistent with sarcoidosis  Echo 09/2017 grade 1 DD  CXR 32020 - clear.   08/08/2019 Follow up : Sarcoid and OSA  Patient presents for a 53-month follow-up.  Patient has underlying sarcoid.  Patient says she is doing well since last visit.  She denies any flare of cough or wheezing.  She remains on Symbicort twice daily. Remaining active, is walking several times a week.  Says smoke is a trigger for her. She does not like to be around this.  Got 1st Covid vaccine.    Patient has underlying obstructive sleep apnea.  She is on nocturnal CPAP.  Patient says she is doing well on CPAP.  She says she wears it every night.  She gets in about average of 6 hours each night.  Patient says she feels rested with no significant daytime sleepiness. CPAP download shows excellent compliance with daily average usage at 6 hours.  Patient is on auto CPAP 8 to 15 cm H2O.  AHI  1.0.  Allergies  Allergen Reactions  . Cinnamon Shortness Of Breath  . Sulfa Antibiotics     hives  . Indomethacin Palpitations  . Robaxin [Methocarbamol] Palpitations    Immunization History  Administered Date(s) Administered  . Influenza Split 04/08/2013, 03/28/2017, 04/28/2018  . Influenza Whole 04/30/2012  . Influenza,inj,Quad PF,6+ Mos 03/27/2015, 04/18/2016  . Influenza-Unspecified 03/30/2014  . PFIZER SARS-COV-2 Vaccination 07/30/2019    Past Medical History:  Diagnosis Date  . Arthritis   . Asthma   . Carpal tunnel syndrome   . COPD (chronic obstructive pulmonary disease) (HCC)    sarcoidosis  . Dysrhythmia    palpitations  . GERD (gastroesophageal reflux disease)   . Graves disease   . Hyperlipidemia   . Hyperthyroidism   . Leaky heart valve   . Lumbar spinal stenosis   . Sarcoidosis   . Sleep apnea    CPAP  . Type 2 diabetes mellitus (HCC)   . Vitamin D deficiency     Tobacco History: Social History   Tobacco Use  Smoking Status Former Smoker  . Packs/day: 0.10  . Years: 10.00  . Pack years: 1.00  . Types: Cigarettes  . Quit date: 06/30/2002  . Years since quitting: 17.1  Smokeless Tobacco Never Used   Counseling given: Not Answered   Outpatient Medications Prior to Visit  Medication Sig Dispense Refill  . acetaminophen (TYLENOL) 500 MG tablet Take 500 mg by mouth every 6 (six) hours as needed for mild pain or headache.     08/29/2002  albuterol (PROVENTIL HFA;VENTOLIN HFA) 108 (90 Base) MCG/ACT inhaler Inhale 2 puffs into the lungs every 6 (six) hours as needed for wheezing or shortness of breath. 1 Inhaler 3  . albuterol (PROVENTIL) (2.5 MG/3ML) 0.083% nebulizer solution Take 3 mLs (2.5 mg total) by nebulization every 6 (six) hours as needed for wheezing or shortness of breath (every 6 hours AND as needed for SOB). 75 mL 6  . aspirin 81 MG tablet Take 81 mg by mouth daily.    Marland Kitchen atorvastatin (LIPITOR) 80 MG tablet Take 80 mg by mouth daily.    . Calcium  Carbonate Antacid (TUMS E-X 750 PO) Take by mouth as needed.    . Cholecalciferol (VITAMIN D-3) 1000 UNITS CAPS Take 1 capsule by mouth daily.     . fluticasone (FLONASE) 50 MCG/ACT nasal spray Place 2 sprays into both nostrils daily as needed for allergies or rhinitis. 48 g 3  . gabapentin (NEURONTIN) 300 MG capsule Take by mouth.    Marland Kitchen ibuprofen (ADVIL,MOTRIN) 800 MG tablet Take 800 mg by mouth every 8 (eight) hours as needed.    Marland Kitchen ketoconazole (NIZORAL) 2 % cream Apply 1 application topically 2 (two) times daily.    Marland Kitchen Lifitegrast (XIIDRA) 5 % SOLN Place 1 drop into both eyes 2 times daily.    . metFORMIN (GLUCOPHAGE) 500 MG tablet Take 1,000 mg by mouth every evening.     . Multiple Vitamin (MULTIVITAMIN) tablet Take 1 tablet by mouth daily.    Marland Kitchen omeprazole (PRILOSEC) 20 MG capsule Take 20 mg by mouth daily as needed (indigestion).     . SUMAtriptan (IMITREX) 50 MG tablet Take 1 tablet (50 mg total) by mouth every 2 (two) hours as needed for migraine. May repeat in 2 hours if headache persists or recurs. 10 tablet 6  . SYMBICORT 160-4.5 MCG/ACT inhaler USE 2 INHALATIONS TWICE A DAY 30.6 g 4  . SYMBICORT 160-4.5 MCG/ACT inhaler Inhale 2 puffs into the lungs 2 (two) times daily. 3 Inhaler 4  . venlafaxine XR (EFFEXOR-XR) 75 MG 24 hr capsule Take 1 capsule (75 mg total) by mouth daily. 90 capsule 4   No facility-administered medications prior to visit.     Review of Systems:   Constitutional:   No  weight loss, night sweats,  Fevers, chills, fatigue, or  lassitude.  HEENT:   No headaches,  Difficulty swallowing,  Tooth/dental problems, or  Sore throat,                No sneezing, itching, ear ache, nasal congestion, post nasal drip,   CV:  No chest pain,  Orthopnea, PND, swelling in lower extremities, anasarca, dizziness, palpitations, syncope.   GI  No heartburn, indigestion, abdominal pain, nausea, vomiting, diarrhea, change in bowel habits, loss of appetite, bloody stools.   Resp: No  shortness of breath with exertion or at rest.  No excess mucus, no productive cough,  No non-productive cough,  No coughing up of blood.  No change in color of mucus.  No wheezing.  No chest wall deformity  Skin: no rash or lesions.  GU: no dysuria, change in color of urine, no urgency or frequency.  No flank pain, no hematuria   MS:  No joint pain or swelling.  No decreased range of motion.  No back pain.    Physical Exam  BP 112/76 (BP Location: Left Arm, Patient Position: Sitting, Cuff Size: Normal)   Pulse 62   Temp (!) 97.3 F (36.3 C) (Temporal)  Ht 5' (1.524 m)   Wt 195 lb 9.6 oz (88.7 kg)   SpO2 98% Comment: on RA  BMI 38.20 kg/m   GEN: A/Ox3; pleasant , NAD, BMI 38    HEENT:  [place]/AT,  EACs-clear, TMs-wnl, NOSE-clear, THROAT-clear, no lesions, no postnasal drip or exudate noted.   NECK:  Supple w/ fair ROM; no JVD; normal carotid impulses w/o bruits; no thyromegaly or nodules palpated; no lymphadenopathy.    RESP  Clear  P & A; w/o, wheezes/ rales/ or rhonchi. no accessory muscle use, no dullness to percussion  CARD:  RRR, no m/r/g, no peripheral edema, pulses intact, no cyanosis or clubbing.  GI:   Soft & nt; nml bowel sounds; no organomegaly or masses detected.   Musco: Warm bil, no deformities or joint swelling noted.   Neuro: alert, no focal deficits noted.    Skin: Warm, no lesions or rashes    Lab Results:   BNP No results found for: BNP  ProBNP No results found for: PROBNP  Imaging: No results found.    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   Plan  Patient Instructions  Continue on CPAP At bedtime   Work on healthy weight Do not drive if sleepy Continue on Symbicort twice daily.  Follow-up with Dr. Elsworth Soho or Javeah Loeza NP in 6 months and As needed   Please contact office for sooner follow up if symptoms do not improve or worsen or seek emergency care       OSA (obstructive sleep apnea) Doing well on CPAP .   Plan  Patient Instructions  Continue on CPAP At bedtime   Work on healthy weight Do not drive if sleepy Continue on Symbicort twice daily.  Follow-up with Dr. Elsworth Soho or Jerold Yoss NP in 6 months and As needed   Please contact office for sooner follow up if symptoms do not improve or worsen or seek emergency care       Morbid obesity ([place]) Weight loss      Rexene Edison, NP 08/08/2019

## 2019-08-08 NOTE — Addendum Note (Signed)
Addended by: Benjie Karvonen R on: 08/08/2019 10:31 AM   Modules accepted: Orders

## 2019-09-21 ENCOUNTER — Telehealth: Payer: Medicare Other | Admitting: Physician Assistant

## 2019-10-18 ENCOUNTER — Ambulatory Visit: Payer: Medicare Other | Admitting: Cardiology

## 2019-10-27 ENCOUNTER — Ambulatory Visit (INDEPENDENT_AMBULATORY_CARE_PROVIDER_SITE_OTHER): Payer: Medicare Other | Admitting: Psychology

## 2019-10-27 DIAGNOSIS — F4323 Adjustment disorder with mixed anxiety and depressed mood: Secondary | ICD-10-CM | POA: Diagnosis not present

## 2019-11-07 ENCOUNTER — Ambulatory Visit (INDEPENDENT_AMBULATORY_CARE_PROVIDER_SITE_OTHER): Payer: Medicare Other | Admitting: Psychology

## 2019-11-07 DIAGNOSIS — F4323 Adjustment disorder with mixed anxiety and depressed mood: Secondary | ICD-10-CM

## 2019-11-21 ENCOUNTER — Ambulatory Visit (INDEPENDENT_AMBULATORY_CARE_PROVIDER_SITE_OTHER): Payer: Medicare Other | Admitting: Psychology

## 2019-11-21 DIAGNOSIS — F4323 Adjustment disorder with mixed anxiety and depressed mood: Secondary | ICD-10-CM

## 2019-12-23 ENCOUNTER — Other Ambulatory Visit: Payer: Self-pay | Admitting: Physician Assistant

## 2019-12-23 DIAGNOSIS — R7401 Elevation of levels of liver transaminase levels: Secondary | ICD-10-CM

## 2019-12-30 ENCOUNTER — Ambulatory Visit
Admission: RE | Admit: 2019-12-30 | Discharge: 2019-12-30 | Disposition: A | Discharge status: Home / Self Care | Payer: Medicare Other | Source: Ambulatory Visit | Attending: Physician Assistant | Admitting: Physician Assistant

## 2019-12-30 DIAGNOSIS — R7401 Elevation of levels of liver transaminase levels: Secondary | ICD-10-CM

## 2020-01-23 ENCOUNTER — Ambulatory Visit (INDEPENDENT_AMBULATORY_CARE_PROVIDER_SITE_OTHER): Payer: Medicare Other | Admitting: Psychology

## 2020-01-23 DIAGNOSIS — F4323 Adjustment disorder with mixed anxiety and depressed mood: Secondary | ICD-10-CM

## 2020-02-06 ENCOUNTER — Ambulatory Visit (INDEPENDENT_AMBULATORY_CARE_PROVIDER_SITE_OTHER): Payer: Medicare Other

## 2020-02-06 ENCOUNTER — Encounter: Payer: Self-pay | Admitting: Adult Health

## 2020-02-06 ENCOUNTER — Other Ambulatory Visit: Payer: Self-pay

## 2020-02-06 ENCOUNTER — Ambulatory Visit (INDEPENDENT_AMBULATORY_CARE_PROVIDER_SITE_OTHER): Payer: Medicare Other | Admitting: Adult Health

## 2020-02-06 VITALS — BP 124/68 | HR 92 | Temp 98.0°F | Ht 60.0 in | Wt 196.6 lb

## 2020-02-06 DIAGNOSIS — G4733 Obstructive sleep apnea (adult) (pediatric): Secondary | ICD-10-CM

## 2020-02-06 DIAGNOSIS — D862 Sarcoidosis of lung with sarcoidosis of lymph nodes: Secondary | ICD-10-CM

## 2020-02-06 NOTE — Progress Notes (Signed)
@Patient  ID: , female    DOB: [birth year], 59 y.o.   MRN: [id number]  Chief Complaint  Patient presents with  . Follow-up    Sarcoid and OSA     Referring provider: [id number], MD  HPI: 59 year old female former smoker followed for sarcoidosis and moderate obstructive sleep apnea Medical history significant for Graves' disease status post radioactive iodine, esophageal dysmotility with previous esophageal dilatation by gastroenterology, DM .   TEST/EVENTS :  Esophagram7/2019 >>Dysmotility &stasis >>referred to GI  01/2013 PSG -wt 190 pounds, BMI 34 showed predominant hypopneas during supine sleep and with AHI of 16 events per hour. Nonsupine AHI was only 3 events per hour.   PFTs 07/2012 nml lung volumes, no obs, DLCO 71% PFT 10/2014 >>ratio 80, FVC 104%, FEV1 108%, DLCO 83%-normal 06/2017 spiro >> nml, FVC 92%   CT chest 05/2013 Mediastinal/bilateral hilar lymphadenopathy, Peribronchovascular nodularity throughout all lobes, numerous lesions in the spleen.  HRCT 08/2017 >>decreased lymphadenopathy, mild 1 to 2 mm nodules along fissures consistent with sarcoidosis  Echo 09/2017 grade 1 DD  CXR 32020 - clear.   02/06/2020 Follow up : Sarcoid and OSA Patient returns for a 61-month follow-up.  Patient is followed for underlying sarcoidosis.  Patient says overall breathing has been doing well.  She remains on Symbicort twice daily.  She remains active. Going to PT for back pain. Denies any cough or wheezing . Covid vaccine is up-to-date.  Patient has underlying obstructive sleep apnea.  She is on nocturnal CPAP.  She says she wears her CPAP each night.  Feels rested with no significant daytime sleepiness.  Feels that she benefits from CPAP. CPAP download shows excellent compliance with daily average usage at around 5 hours.  Patient is on auto CPAP 8 to 15 cm H2O.  AHI 0.7. Recently switched to nasal mask, much better for her. Full face was drying her eyes  out.  Recently dx with [place].     Allergies  Allergen Reactions  . Cinnamon Shortness Of Breath  . Sulfa Antibiotics     hives  . Indomethacin Palpitations  . Robaxin [Methocarbamol] Palpitations    Immunization History  Administered Date(s) Administered  . Influenza Split 04/08/2013, 03/28/2017, 04/19/2019  . Influenza Whole 04/30/2012  . Influenza,inj,Quad PF,6+ Mos 03/27/2015, 04/18/2016  . Influenza-Unspecified 03/30/2014  . PFIZER SARS-COV-2 Vaccination 07/30/2019, 08/28/2019    Past Medical History:  Diagnosis Date  . Arthritis   . Asthma   . Carpal tunnel syndrome   . COPD (chronic obstructive pulmonary disease) (HCC)    sarcoidosis  . Dysrhythmia    palpitations  . GERD (gastroesophageal reflux disease)   . Graves disease   . Hyperlipidemia   . Hyperthyroidism   . Leaky heart valve   . Lumbar spinal stenosis   . Sarcoidosis   . Sleep apnea    CPAP  . Type 2 diabetes mellitus (HCC)   . Vitamin D deficiency     Tobacco History: Social History   Tobacco Use  Smoking Status Former Smoker  . Packs/day: 0.10  . Years: 10.00  . Pack years: 1.00  . Types: Cigarettes  . Quit date: 06/30/2002  . Years since quitting: 17.6  Smokeless Tobacco Never Used   Counseling given: Not Answered   Outpatient Medications Prior to Visit  Medication Sig Dispense Refill  . albuterol (PROVENTIL) (2.5 MG/3ML) 0.083% nebulizer solution Take 3 mLs (2.5 mg total) by nebulization every 6 (six) hours as needed for wheezing or shortness of breath (  every 6 hours AND as needed for SOB). 75 mL 6  . albuterol (VENTOLIN HFA) 108 (90 Base) MCG/ACT inhaler Inhale 2 puffs into the lungs every 6 (six) hours as needed for wheezing or shortness of breath. 8 g 0  . aspirin 81 MG tablet Take 81 mg by mouth daily.    . Calcium Carbonate Antacid (TUMS E-X 750 PO) Take by mouth as needed.    . Cholecalciferol (VITAMIN D-3) 1000 UNITS CAPS Take 1 capsule by mouth daily.     . fluticasone  (FLONASE) 50 MCG/ACT nasal spray Place 2 sprays into both nostrils daily as needed for allergies or rhinitis. 48 g 3  . gabapentin (NEURONTIN) 300 MG capsule Take by mouth.    Marland Kitchen ibuprofen (ADVIL,MOTRIN) 800 MG tablet Take 800 mg by mouth every 8 (eight) hours as needed.    Marland Kitchen ketoconazole (NIZORAL) 2 % cream Apply 1 application topically 2 (two) times daily.    Marland Kitchen Lifitegrast (XIIDRA) 5 % SOLN Place 1 drop into both eyes 2 times daily.    . metFORMIN (GLUCOPHAGE) 500 MG tablet Take 1,000 mg by mouth every evening.     . Multiple Vitamin (MULTIVITAMIN) tablet Take 1 tablet by mouth daily.    Marland Kitchen omeprazole (PRILOSEC) 20 MG capsule Take 20 mg by mouth daily as needed (indigestion).     . ROCKLATAN 0.02-0.005 % SOLN Apply 1 drop to eye at bedtime.    . SUMAtriptan (IMITREX) 50 MG tablet Take 1 tablet (50 mg total) by mouth every 2 (two) hours as needed for migraine. May repeat in 2 hours if headache persists or recurs. 10 tablet 6  . SYMBICORT 160-4.5 MCG/ACT inhaler Inhale 2 puffs into the lungs 2 (two) times daily. 3 Inhaler 4  . venlafaxine XR (EFFEXOR-XR) 75 MG 24 hr capsule Take 1 capsule (75 mg total) by mouth daily. 90 capsule 4  . acetaminophen (TYLENOL) 500 MG tablet Take 500 mg by mouth every 6 (six) hours as needed for mild pain or headache.     Marland Kitchen atorvastatin (LIPITOR) 80 MG tablet Take 80 mg by mouth daily.    . SYMBICORT 160-4.5 MCG/ACT inhaler USE 2 INHALATIONS TWICE A DAY 30.6 g 4   No facility-administered medications prior to visit.     Review of Systems:   Constitutional:   No  weight loss, night sweats,  Fevers, chills, + fatigue, or  lassitude.  HEENT:   No headaches,  Difficulty swallowing,  Tooth/dental problems, or  Sore throat,                No sneezing, itching, ear ache, nasal congestion, post nasal drip,   CV:  No chest pain,  Orthopnea, PND, swelling in lower extremities, anasarca, dizziness, palpitations, syncope.   GI  No heartburn, indigestion, abdominal pain,  nausea, vomiting, diarrhea, change in bowel habits, loss of appetite, bloody stools.   Resp: No shortness of breath with exertion or at rest.  No excess mucus, no productive cough,  No non-productive cough,  No coughing up of blood.  No change in color of mucus.  No wheezing.  No chest wall deformity  Skin: no rash or lesions.  GU: no dysuria, change in color of urine, no urgency or frequency.  No flank pain, no hematuria   MS:  No joint pain or swelling.  No decreased range of motion.  +chronic  back pain.    Physical Exam  BP 124/68   Pulse 92   Temp 98 F (  36.7 C) (Oral)   Ht 5' (1.524 m)   Wt 196 lb 9.6 oz (89.2 kg)   SpO2 97%   BMI 38.40 kg/m   GEN: A/Ox3; pleasant , NAD, well nourished    HEENT:  [place]/AT,  NOSE-clear, THROAT-clear, no lesions, no postnasal drip or exudate noted.   NECK:  Supple w/ fair ROM; no JVD; normal carotid impulses w/o bruits; no thyromegaly or nodules palpated; no lymphadenopathy.    RESP  Clear  P & A; w/o, wheezes/ rales/ or rhonchi. no accessory muscle use, no dullness to percussion  CARD:  RRR, no m/r/g, no peripheral edema, pulses intact, no cyanosis or clubbing.  GI:   Soft & nt; nml bowel sounds; no organomegaly or masses detected.   Musco: Warm bil, no deformities or joint swelling noted.   Neuro: alert, no focal deficits noted.    Skin: Warm, no lesions or rashes    Lab Results:  CBC  BNP No results found for: BNP  ProBNP No results found for: PROBNP  Imaging: No results found.    PFT Results Latest Ref Rng & Units 10/31/2014  FVC-Pre L 2.61  FVC-Predicted Pre % 104  FVC-Post L 2.60  FVC-Predicted Post % 104  Pre FEV1/FVC % % 83  Post FEV1/FCV % % 85  FEV1-Pre L 2.16  FEV1-Predicted Pre % 108  FEV1-Post L 2.22  DLCO uncorrected ml/min/mmHg 17.49  DLCO UNC% % 83  DLVA Predicted % 106  TLC L 3.87  TLC % Predicted % 82  RV % Predicted % 68    No results found for: NITRICOXIDE      Assessment & Plan:    OSA (obstructive sleep apnea) Excellent control and compliance on nocturnal CPAP  Plan  Patient Instructions  Continue on CPAP At bedtime   Work on healthy weight Do not drive if sleepy Continue on Symbicort twice daily. Rinse after use.  Chest xray today .  Follow-up with Dr. Vassie Loll or Rithy Mandley NP in 6 months and As needed   Please contact office for sooner follow up if symptoms do not improve or worsen or seek emergency care       Sarcoidosis of lung with sarcoidosis of lymph nodes Sarcoid under good control.  Does not appear to be active.  Check chest x-ray today.  Continue on current regimen  Plan  Patient Instructions  Continue on CPAP At bedtime   Work on healthy weight Do not drive if sleepy Continue on Symbicort twice daily. Rinse after use.  Chest xray today .  Follow-up with Dr. Vassie Loll or Michail Boyte NP in 6 months and As needed   Please contact office for sooner follow up if symptoms do not improve or worsen or seek emergency care          Rubye Oaks, NP 02/06/2020

## 2020-02-06 NOTE — Assessment & Plan Note (Signed)
Sarcoid under good control.  Does not appear to be active.  Check chest x-ray today.  Continue on current regimen  Plan  Patient Instructions  Continue on CPAP At bedtime   Work on healthy weight Do not drive if sleepy Continue on Symbicort twice daily. Rinse after use.  Chest xray today .  Follow-up with Dr. Vassie Loll or Jaycub Noorani NP in 6 months and As needed   Please contact office for sooner follow up if symptoms do not improve or worsen or seek emergency care

## 2020-02-06 NOTE — Assessment & Plan Note (Signed)
Excellent control and compliance on nocturnal CPAP  Plan  Patient Instructions  Continue on CPAP At bedtime   Work on healthy weight Do not drive if sleepy Continue on Symbicort twice daily. Rinse after use.  Chest xray today .  Follow-up with Dr. Vassie Loll or Merridy Pascoe NP in 6 months and As needed   Please contact office for sooner follow up if symptoms do not improve or worsen or seek emergency care

## 2020-02-06 NOTE — Patient Instructions (Addendum)
Continue on CPAP At bedtime   Work on healthy weight Do not drive if sleepy Continue on Symbicort twice daily. Rinse after use.  Chest xray today .  Follow-up with Dr. Vassie Loll or Jahlisa Rossitto NP in 6 months and As needed   Please contact office for sooner follow up if symptoms do not improve or worsen or seek emergency care

## 2020-04-30 ENCOUNTER — Telehealth: Payer: Self-pay | Admitting: Pulmonary Disease

## 2020-04-30 DIAGNOSIS — G4733 Obstructive sleep apnea (adult) (pediatric): Secondary | ICD-10-CM

## 2020-04-30 NOTE — Telephone Encounter (Signed)
Called and spoke with pt letting her know the info stated by TP and she verbalized understanding. Order has been placed for the setting change. Nothing further needed.

## 2020-04-30 NOTE — Telephone Encounter (Signed)
Download shows good compliance with average usage of 5 hours.  Her control is also good.  She is on 8 to 15 cm H2O, can decrease her CPAP pressures to 8 to 12 cm H2O.  See if this helps.  She can also change from the full facemask to the nasal pillows this may help as well as if she is having lots of leaks in her mask that sometimes can cause increased pressure as well. Place an order for CPAP download in 4 weeks please

## 2020-04-30 NOTE — Telephone Encounter (Signed)
Called and spoke with pt who states she needs to have the pressure decreased on her cpap. Pt states she feels like she is swallowing too much air while wearing CPAP. States she has been waking up during the night and stated last night 10/31 she woke up 4 times due to this feeling.  Pt said it has also been giving her some mild chest pain due to the amount of air she has been having from it. Pt states she was using a full facemask but then had to switch to nasal pillows and is having problems.  Pt wants to know if her settings could be decreased.  Tammy, please advise. Download has been printed.

## 2020-05-15 ENCOUNTER — Telehealth: Payer: Self-pay | Admitting: Pulmonary Disease

## 2020-05-15 MED ORDER — SYMBICORT 160-4.5 MCG/ACT IN AERO: 2.0000 | INHALATION_SPRAY | Freq: Two times a day (BID) | RESPIRATORY_TRACT | 6 refills | Status: DC

## 2020-05-15 NOTE — Telephone Encounter (Signed)
Called and spoke with pt and she is aware of refill of the symbicort that has been sent to pharmacy.  Nothing further is needed.

## 2020-05-15 NOTE — Telephone Encounter (Signed)
Called and spoke with Patient.  Patient requested 1 Symbicort sample.  Patient stated she is expecting her Symbicort delivery in the next few days from [facility], but wanted a sample to use in case needed, before prescription arrived. Explained we no longer receive Symbicort samples at office.  Understanding stated.  Nothing further at this time.

## 2020-07-02 ENCOUNTER — Telehealth: Payer: Self-pay | Admitting: Pulmonary Disease

## 2020-07-02 NOTE — Telephone Encounter (Signed)
ATC patient unable to reach LM to call back office (x1)  

## 2020-07-02 NOTE — Telephone Encounter (Signed)
Sx since 12/26, + 12/26 Covid.   Covid vaccine x 3 . (Booster -03/2020)  Mild cold like sx currently   Recommend supportive care, fluids rest and good nutrition.  Mucinex, saline nasal spray Tylenol Advil as needed.  Continue on Symbicort as planned.  Albuterol as needed.  She is out of the window for monoclonal antibody infusion or antivirals.  Advised of red flag symptoms such as severe shortness of breath, hypoxia, nausea vomiting diarrhea, chest pain, hemoptysis, calf pain.  Advised to call back immediately or go to seek emergency room care.  .Please contact office for sooner follow up if symptoms do not improve or worsen or seek emergency care    Keep follow-up with pulmonary as planned and as needed

## 2020-07-10 NOTE — Progress Notes (Unsigned)
Cardiology Office Note    Date:  07/11/2020   ID:  Lindsey Copeland, DOB Apr 08, 1961, MRN [id number]  PCP:  Lindsey Gosselin, MD  Cardiologist:  Armanda Magic, MD   Chief Complaint  Patient presents with  . Chest Pain  . Hyperlipidemia    History of Present Illness:  Lindsey Copeland is a 60 y.o. female with a history of chest pain and SOB and multiple CRF for CAD including DM, dylipidemia and remote tobacco use. The patient also has a history of GERD. She has a history of obstructive sleep apnea and COPD and uses a CPAP machine. Dr. Vassie Loll is her pulmonologist. Nuclear stress test 2019 showed no ischemia.  She has chronic DOE from COPD and sarcoid which is under control with inhalers. 2D echo was done and showed normal LV function with grade 1 DD.  SHe is here today for evaluation of chest pain that she describes as a pinch.  She says that she finds herself rubbing the area.  She was dx with COVID 19 on 12/26 and since then her CP has gotten worse.  She denies any associated sx of nausea, diaphoresis.  She has chronic DOE related to COPD which is stable on inhalers.  She denies any  PND, orthopnea, LE edema, dizziness, palpitations or syncope. She is compliant with her meds and is tolerating meds with no SE.    Past Medical History:  Diagnosis Date  . Arthritis   . Asthma   . Carpal tunnel syndrome   . COPD (chronic obstructive pulmonary disease) (HCC)    sarcoidosis  . Dysrhythmia    palpitations  . GERD (gastroesophageal reflux disease)   . Graves disease   . Hyperlipidemia   . Hyperthyroidism   . Leaky heart valve   . Lumbar spinal stenosis   . Sarcoidosis   . Sleep apnea    CPAP  . Type 2 diabetes mellitus (HCC)   . Vitamin D deficiency     Past Surgical History:  Procedure Laterality Date  . CARPAL TUNNEL RELEASE Right 02/20/2015   Procedure: RIGHT CARPAL TUNNEL RELEASE;  Surgeon: Cindee Salt, MD;  Location: [facility];  Service: Orthopedics;  Laterality:  Right;  ANESTHESIA:  IV REGIONAL FAB  . CARPAL TUNNEL RELEASE Left 10/09/2015   Procedure: LEFT CARPAL TUNNEL RELEASE;  Surgeon: Cindee Salt, MD;  Location: [facility];  Service: Orthopedics;  Laterality: Left;  . radioactive iodine treatment    . right knee arthroscopy    . SHOULDER ARTHROSCOPY    . TUBAL LIGATION    . ULNAR NERVE REPAIR    . VIDEO BRONCHOSCOPY  07/30/2012   Procedure: VIDEO BRONCHOSCOPY WITH FLUORO;  Surgeon: Storm Frisk, MD;  Location: [facility];  Service: Cardiopulmonary;  Laterality: N/A;    Current Medications: Outpatient Medications Prior to Visit  Medication Sig Dispense Refill  . albuterol (PROVENTIL) (2.5 MG/3ML) 0.083% nebulizer solution Take 3 mLs (2.5 mg total) by nebulization every 6 (six) hours as needed for wheezing or shortness of breath (every 6 hours AND as needed for SOB). 75 mL 6  . albuterol (VENTOLIN HFA) 108 (90 Base) MCG/ACT inhaler Inhale 2 puffs into the lungs every 6 (six) hours as needed for wheezing or shortness of breath. 8 g 0  . aspirin 81 MG tablet Take 81 mg by mouth daily.    . Calcium Carbonate Antacid (TUMS E-X 750 PO) Take by mouth as needed.    . Cholecalciferol (VITAMIN D-3) 1000 UNITS  CAPS Take 1 capsule by mouth daily.     . fluticasone (FLONASE) 50 MCG/ACT nasal spray Place 2 sprays into both nostrils daily as needed for allergies or rhinitis. 48 g 3  . gabapentin (NEURONTIN) 300 MG capsule Take by mouth.    Marland Kitchen ibuprofen (ADVIL,MOTRIN) 800 MG tablet Take 800 mg by mouth every 8 (eight) hours as needed.    Marland Kitchen ketoconazole (NIZORAL) 2 % cream Apply 1 application topically 2 (two) times daily.    Marland Kitchen Lifitegrast (XIIDRA) 5 % SOLN Place 1 drop into both eyes 2 times daily.    . metFORMIN (GLUCOPHAGE) 500 MG tablet Take 1,000 mg by mouth every evening.     . Multiple Vitamin (MULTIVITAMIN) tablet Take 1 tablet by mouth daily.    Marland Kitchen omeprazole (PRILOSEC) 20 MG capsule Take 20 mg by mouth daily as needed (indigestion).      . ROCKLATAN 0.02-0.005 % SOLN Apply 1 drop to eye at bedtime.    . SUMAtriptan (IMITREX) 50 MG tablet Take 1 tablet (50 mg total) by mouth every 2 (two) hours as needed for migraine. May repeat in 2 hours if headache persists or recurs. 10 tablet 6  . SYMBICORT 160-4.5 MCG/ACT inhaler Inhale 2 puffs into the lungs 2 (two) times daily. 10.2 g 6  . venlafaxine XR (EFFEXOR-XR) 75 MG 24 hr capsule Take 1 capsule (75 mg total) by mouth daily. 90 capsule 4   No facility-administered medications prior to visit.     Allergies:   Cinnamon, Sulfa antibiotics, Indomethacin, and Robaxin [methocarbamol]   Social History   Socioeconomic History  . Marital status: Married    Spouse name: Not on file  . Number of children: 3  . Years of education: Not on file  . Highest education level: Not on file  Occupational History  . Occupation: Child psychotherapist  Tobacco Use  . Smoking status: Former Smoker    Packs/day: 0.10    Years: 10.00    Pack years: 1.00    Types: Cigarettes    Quit date: 06/30/2002    Years since quitting: 18.0  . Smokeless tobacco: Never Used  Vaping Use  . Vaping Use: Never used  Substance and Sexual Activity  . Alcohol use: Yes    Comment: occasionally  . Drug use: No  . Sexual activity: Not on file  Other Topics Concern  . Not on file  Social History Narrative  . Not on file   Social Determinants of Health   Financial Resource Strain: Not on file  Food Insecurity: Not on file  Transportation Needs: Not on file  Physical Activity: Not on file  Stress: Not on file  Social Connections: Not on file     Family History:  The patient's family history includes Asthma in her brother; Breast cancer in her sister; Diabetes in her mother and sister; Glaucoma in her mother; Heart attack in her brother; Heart failure in her mother; Pancreatic cancer in her maternal aunt.   ROS:   Please see the history of present illness.    Except what is stated in the HPI,  All other systems  reviewed and are negative.   PHYSICAL EXAM:   VS:  BP 112/80 (BP Location: Left Arm, Patient Position: Sitting, Cuff Size: Normal)   Pulse 66   Ht 5' (1.524 m)   Wt 191 lb 12.8 oz (87 kg)   SpO2 97%   BMI 37.46 kg/m     GEN: Well nourished, well developed in no  acute distress HEENT: Normal NECK: No JVD; No carotid bruits LYMPHATICS: No lymphadenopathy CARDIAC:RRR, no murmurs, rubs, gallops RESPIRATORY:  Clear to auscultation without rales, wheezing or rhonchi  ABDOMEN: Soft, non-tender, non-distended MUSCULOSKELETAL:  No edema; No deformity  SKIN: Warm and dry NEUROLOGIC:  Alert and oriented x 3 PSYCHIATRIC:  Normal affect    Wt Readings from Last 3 Encounters:  07/11/20 191 lb 12.8 oz (87 kg)  02/06/20 196 lb 9.6 oz (89.2 kg)  08/08/19 195 lb 9.6 oz (88.7 kg)      Studies/Labs Reviewed:   EKG:  EKG is ordered today and showed   Recent Labs: No results found for requested labs within last 8760 hours.   Lipid Panel No results found for: CHOL, TRIG, HDL, CHOLHDL, VLDL, LDLCALC, LDLDIRECT  Additional studies/ records that were reviewed today include:  none    ASSESSMENT:    1. Chest pain of uncertain etiology   2. Pure hypercholesterolemia   3. COVID-19      PLAN:  In order of problems listed above:  1.  Chest pain  -nuclear stress test in 2019 showed no ischemia and 2D echo was normal -CP had essentially resolved after quitting her job and taking a course in stress management from the [place]. -now with recurrent atypical chest pain starting around the holidays and now worse after COVID 19 -EKG is normal   -she does have a family hx of premature CAD with 2 brothers having MI's  2.  HLD -LDL goal < 100 -followed by PCP  3. COVID 19 -she recently had COVID 19 and now with recurrent CP -check 2D echo to assess for pericardial fluid and EF   Medication Adjustments/Labs and Tests Ordered: Current medicines are reviewed at length with the patient today.   Concerns regarding medicines are outlined above.  Medication changes, Labs and Tests ordered today are listed in the Patient Instructions below. There are no Patient Instructions on file for this visit.   Signed, Armanda Magic, MD  07/11/2020 8:56 AM    [facility] [address] [place], [place], [place]  [address] Phone: [phone]; Fax: [phone]

## 2020-07-11 ENCOUNTER — Encounter: Payer: Self-pay | Admitting: Cardiology

## 2020-07-11 ENCOUNTER — Ambulatory Visit (INDEPENDENT_AMBULATORY_CARE_PROVIDER_SITE_OTHER): Payer: Medicare Other | Admitting: Cardiology

## 2020-07-11 ENCOUNTER — Other Ambulatory Visit: Payer: Self-pay

## 2020-07-11 VITALS — BP 112/80 | HR 66 | Ht 60.0 in | Wt 191.8 lb

## 2020-07-11 DIAGNOSIS — E7800 Pure hypercholesterolemia, unspecified: Secondary | ICD-10-CM

## 2020-07-11 DIAGNOSIS — R072 Precordial pain: Secondary | ICD-10-CM | POA: Diagnosis not present

## 2020-07-11 DIAGNOSIS — U071 COVID-19: Secondary | ICD-10-CM

## 2020-07-11 DIAGNOSIS — R079 Chest pain, unspecified: Secondary | ICD-10-CM

## 2020-07-11 DIAGNOSIS — R931 Abnormal findings on diagnostic imaging of heart and coronary circulation: Secondary | ICD-10-CM

## 2020-07-11 MED ORDER — METOPROLOL TARTRATE 100 MG PO TABS: 100.0000 mg | ORAL_TABLET | Freq: Once | ORAL | 0 refills | Status: DC

## 2020-07-11 NOTE — Patient Instructions (Addendum)
Medication Instructions:  Your physician recommends that you continue on your current medications as directed. Please refer to the Current Medication list given to you today.  *If you need a refill on your cardiac medications before your next appointment, please call your pharmacy*   Testing/Procedures: Your physician has requested that you have an echocardiogram. Echocardiography is a painless test that uses sound waves to create images of your heart. It provides your doctor with information about the size and shape of your heart and how well your heart's chambers and valves are working. This procedure takes approximately one hour. There are no restrictions for this procedure.  Your physician has requested that you have a Coronary CTA scan. Please see next page for further instructions.   Follow-Up: At [facility], you and your health needs are our priority.  As part of our continuing mission to provide you with exceptional heart care, we have created designated Provider Care Teams.  These Care Teams include your primary Cardiologist (physician) and Advanced Practice Providers (APPs -  Physician Assistants and Nurse Practitioners) who all work together to provide you with the care you need, when you need it.   Your next appointment:   1 year(s)  The format for your next appointment:   In Person  Provider:   You may see Fransico Him, MD or one of the following Advanced Practice Providers on your designated Care Team:    Melina Copa, PA-C  Ermalinda Barrios, PA-C    Other Instructions Your cardiac CT will be scheduled at:   [facility] [address] [place], [place] [address] [phone]  Please arrive at the [facility] main entrance of [facility] 30 minutes prior to test start time. Proceed to the [facility] Radiology Department (first floor) to check-in and test prep.   Please follow these instructions carefully (unless otherwise directed):  On the  Night Before the Test: . Be sure to Drink plenty of water. . Do not consume any caffeinated/decaffeinated beverages or chocolate 12 hours prior to your test. . Do not take any antihistamines 12 hours prior to your test.  On the Day of the Test: . Drink plenty of water. Do not drink any water within one hour of the test. . Do not eat any food 4 hours prior to the test. . You may take your regular medications prior to the test.  . Take metoprolol (Lopressor) two hours prior to test. . FEMALES- please wear underwire-free bra if available   After the Test: . Drink plenty of water. . After receiving IV contrast, you may experience a mild flushed feeling. This is normal. . On occasion, you may experience a mild rash up to 24 hours after the test. This is not dangerous. If this occurs, you can take Benadryl 25 mg and increase your fluid intake. . If you experience trouble breathing, this can be serious. If it is severe call 911 IMMEDIATELY. If it is mild, please call our office. . If you take any of these medications: Glipizide/Metformin, Avandament, Glucavance, please do not take 48 hours after completing test unless otherwise instructed.   Once we have confirmed authorization from your insurance company, we will call you to set up a date and time for your test. Based on how quickly your insurance processes prior authorizations requests, please allow up to 4 weeks to be contacted for scheduling your Cardiac CT appointment. Be advised that routine Cardiac CT appointments could be scheduled as many as 8  weeks after your provider has ordered it.  For non-scheduling related questions, please contact the cardiac imaging nurse navigator should you have any questions/concerns: Marchia Bond, Cardiac Imaging Nurse Navigator Burley Saver, Interim Cardiac Imaging Nurse [place] and Vascular Services Direct Office Dial: [phone]   For scheduling needs, including cancellations and  rescheduling, please call [place], [phone].   Your cardiac CT will be scheduled at one of the below locations:   [facility] [address] [place], [place] [address] [phone]  [place] [address] [place], [place] [address] [phone]  If scheduled at [facility], please arrive at the [facility] main entrance of [facility] 30 minutes prior to test start time. Proceed to the [facility] Radiology Department (first floor) to check-in and test prep.  If scheduled at [facility], please arrive 15 mins early for check-in and test prep.  Please follow these instructions carefully (unless otherwise directed):  Hold all erectile dysfunction medications at least 3 days (72 hrs) prior to test.  On the Night Before the Test: . Be sure to Drink plenty of water. . Do not consume any caffeinated/decaffeinated beverages or chocolate 12 hours prior to your test. . Do not take any antihistamines 12 hours prior to your test. . If the patient has contrast allergy: ? Patient will need a prescription for Prednisone and very clear instructions (as follows): 1. Prednisone 50 mg - take 13 hours prior to test 2. Take another Prednisone 50 mg 7 hours prior to test 3. Take another Prednisone 50 mg 1 hour prior to test 4. Take Benadryl 50 mg 1 hour prior to test . Patient must complete all four doses of above prophylactic medications. . Patient will need a ride after test due to Benadryl.  On the Day of the Test: . Drink plenty of water. Do not drink any water within one hour of the test. . Do not eat any food 4 hours prior to the test. . You may take your regular medications prior to the test.  . Take metoprolol (Lopressor) two hours prior to test. . HOLD Furosemide/Hydrochlorothiazide morning of the test. . FEMALES- please wear underwire-free bra if available   *For  Clinical Staff only. Please instruct patient the following:*        -Drink plenty of water       -Hold Furosemide/hydrochlorothiazide morning of the test       -Take metoprolol (Lopressor) 2 hours prior to test (if applicable).                  -If HR is less than 55 BPM- No Lopressor                -IF HR is greater than 55 BPM and patient is less than or equal to 57 yrs old Lopressor 175m x1.                -If HR is greater than 55 BPM and patient is greater than 724yrs old Lopressor 50 mg x1.     Do not give Lopressor to patients with an allergy to lopressor or anyone with asthma or active COPD symptoms (currently taking steroids).       After the Test: . Drink plenty of water. . After receiving IV contrast, you may experience a mild flushed feeling. This is normal. . On occasion, you may experience a mild rash up  to 24 hours after the test. This is not dangerous. If this occurs, you can take Benadryl 25 mg and increase your fluid intake. . If you experience trouble breathing, this can be serious. If it is severe call 911 IMMEDIATELY. If it is mild, please call our office. . If you take any of these medications: Glipizide/Metformin, Avandament, Glucavance, please do not take 48 hours after completing test unless otherwise instructed.   Once we have confirmed authorization from your insurance company, we will call you to set up a date and time for your test. Based on how quickly your insurance processes prior authorizations requests, please allow up to 4 weeks to be contacted for scheduling your Cardiac CT appointment. Be advised that routine Cardiac CT appointments could be scheduled as many as 8 weeks after your provider has ordered it.  For non-scheduling related questions, please contact the cardiac imaging nurse navigator should you have any questions/concerns: Marchia Bond, Cardiac Imaging Nurse Navigator Burley Saver, Interim Cardiac Imaging Nurse [place] and  Vascular Services Direct Office Dial: [phone]   For scheduling needs, including cancellations and rescheduling, please call [place], [phone].

## 2020-07-11 NOTE — Addendum Note (Signed)
Addended by: Theresia Majors on: 07/11/2020 09:02 AM   Modules accepted: Orders

## 2020-07-25 ENCOUNTER — Other Ambulatory Visit: Payer: Self-pay

## 2020-07-25 ENCOUNTER — Other Ambulatory Visit: Payer: Medicare Other | Admitting: *Deleted

## 2020-07-25 DIAGNOSIS — E7800 Pure hypercholesterolemia, unspecified: Secondary | ICD-10-CM

## 2020-07-25 DIAGNOSIS — R079 Chest pain, unspecified: Secondary | ICD-10-CM

## 2020-07-25 DIAGNOSIS — U071 COVID-19: Secondary | ICD-10-CM

## 2020-07-26 ENCOUNTER — Telehealth (HOSPITAL_COMMUNITY): Payer: Self-pay | Admitting: Emergency Medicine

## 2020-07-26 LAB — BASIC METABOLIC PANEL
BUN/Creatinine Ratio: 21 (ref 12–28)
BUN: 17 mg/dL (ref 8–27)
CO2: 23 mmol/L (ref 20–29)
Calcium: 9.2 mg/dL (ref 8.7–10.3)
Chloride: 105 mmol/L (ref 96–106)
Creatinine, Ser: 0.8 mg/dL (ref 0.57–1.00)
GFR calc Af Amer: 92 mL/min/{1.73_m2} (ref >59)
GFR calc non Af Amer: 80 mL/min/{1.73_m2} (ref >59)
Glucose: 87 mg/dL (ref 65–99)
Potassium: 4.4 mmol/L (ref 3.5–5.2)
Sodium: 143 mmol/L (ref 134–144)

## 2020-07-26 NOTE — Telephone Encounter (Signed)
Reaching out to patient to offer assistance regarding upcoming cardiac imaging study; pt verbalizes understanding of appt date/time, parking situation and where to check in, pre-test NPO status and medications ordered, and verified current allergies; name and call back number provided for further questions should they arise Hadlee Burback RN Navigator Cardiac Imaging [place] [facility] [phone] office [phone] cell 

## 2020-07-30 ENCOUNTER — Other Ambulatory Visit: Payer: Self-pay

## 2020-07-30 ENCOUNTER — Encounter: Payer: Medicare Other | Admitting: *Deleted

## 2020-07-30 ENCOUNTER — Ambulatory Visit (HOSPITAL_COMMUNITY)
Admission: RE | Admit: 2020-07-30 | Discharge: 2020-07-30 | Disposition: A | Discharge status: Home / Self Care | Payer: Medicare Other | Source: Ambulatory Visit | Attending: Cardiology | Admitting: Cardiology

## 2020-07-30 DIAGNOSIS — Z006 Encounter for examination for normal comparison and control in clinical research program: Secondary | ICD-10-CM

## 2020-07-30 DIAGNOSIS — R072 Precordial pain: Secondary | ICD-10-CM | POA: Insufficient documentation

## 2020-07-30 MED ORDER — NITROGLYCERIN 0.4 MG SL SUBL
SUBLINGUAL_TABLET | SUBLINGUAL | Status: AC
  Filled 2020-07-30: qty 2

## 2020-07-30 MED ORDER — IOHEXOL 350 MG/ML SOLN
80.0000 mL | Freq: Once | INTRAVENOUS | Status: AC | PRN
  Administered 2020-07-30: 80 mL via INTRAVENOUS

## 2020-07-30 MED ORDER — NITROGLYCERIN 0.4 MG SL SUBL
0.8000 mg | SUBLINGUAL_TABLET | Freq: Once | SUBLINGUAL | Status: AC
  Administered 2020-07-30: 0.8 mg via SUBLINGUAL

## 2020-07-30 NOTE — Research (Signed)
Subject Name: Lindsey Copeland  Subject met inclusion and exclusion criteria.  The informed consent form, study requirements and expectations were reviewed with the subject and questions and concerns were addressed prior to the signing of the consent form.  The subject verbalized understanding of the trial requirements.  The subject agreed to participate in the IDENTIFY trial and signed the informed consent at 0849 on 07/30/20  The informed consent was obtained prior to performance of any protocol-specific procedures for the subject.  A copy of the signed informed consent was given to the subject and a copy was placed in the subject's medical record.   [name]

## 2020-07-31 ENCOUNTER — Ambulatory Visit (HOSPITAL_COMMUNITY): Payer: Medicare Other | Attending: Internal Medicine

## 2020-07-31 DIAGNOSIS — E7800 Pure hypercholesterolemia, unspecified: Secondary | ICD-10-CM | POA: Diagnosis present

## 2020-07-31 DIAGNOSIS — U071 COVID-19: Secondary | ICD-10-CM

## 2020-07-31 DIAGNOSIS — R079 Chest pain, unspecified: Secondary | ICD-10-CM | POA: Diagnosis not present

## 2020-07-31 LAB — ECHOCARDIOGRAM COMPLETE
Area-P 1/2: 3.31 cm2
S' Lateral: 2.9 cm

## 2020-08-23 ENCOUNTER — Ambulatory Visit: Payer: Medicare Other | Admitting: Pulmonary Disease

## 2020-08-27 ENCOUNTER — Ambulatory Visit (INDEPENDENT_AMBULATORY_CARE_PROVIDER_SITE_OTHER): Payer: Medicare Other | Admitting: Pulmonary Disease

## 2020-08-27 ENCOUNTER — Other Ambulatory Visit: Payer: Self-pay

## 2020-08-27 ENCOUNTER — Encounter: Payer: Self-pay | Admitting: Pulmonary Disease

## 2020-08-27 DIAGNOSIS — J069 Acute upper respiratory infection, unspecified: Secondary | ICD-10-CM

## 2020-08-27 DIAGNOSIS — G4733 Obstructive sleep apnea (adult) (pediatric): Secondary | ICD-10-CM | POA: Diagnosis not present

## 2020-08-27 DIAGNOSIS — D862 Sarcoidosis of lung with sarcoidosis of lymph nodes: Secondary | ICD-10-CM | POA: Diagnosis not present

## 2020-08-27 DIAGNOSIS — J45998 Other asthma: Secondary | ICD-10-CM

## 2020-08-27 DIAGNOSIS — R0602 Shortness of breath: Secondary | ICD-10-CM

## 2020-08-27 MED ORDER — ALBUTEROL SULFATE (2.5 MG/3ML) 0.083% IN NEBU: 2.5000 mg | INHALATION_SOLUTION | Freq: Four times a day (QID) | RESPIRATORY_TRACT | 6 refills | Status: AC | PRN

## 2020-08-27 MED ORDER — ALBUTEROL SULFATE (2.5 MG/3ML) 0.083% IN NEBU: 2.5000 mg | INHALATION_SOLUTION | Freq: Four times a day (QID) | RESPIRATORY_TRACT | 6 refills | Status: DC | PRN

## 2020-08-27 MED ORDER — SYMBICORT 160-4.5 MCG/ACT IN AERO: 2.0000 | INHALATION_SPRAY | Freq: Two times a day (BID) | RESPIRATORY_TRACT | 6 refills | Status: DC

## 2020-08-27 NOTE — Assessment & Plan Note (Signed)
Independently reviewed CT, no significant lung disease. Sarcoid appears to be in remission, no extrapulmonary sites involved

## 2020-08-27 NOTE — Patient Instructions (Signed)
Refills on symbicort & albuterol nebs CPAP is working well -please use at least 6 hours every night

## 2020-08-27 NOTE — Assessment & Plan Note (Signed)
Asthma, mild persistent, symptoms controlled, no nocturnal symptoms. We will provide refills on Symbicort and albuterol nebs to keep handy during an emergency. We will attempt stepdown in the future

## 2020-08-27 NOTE — Assessment & Plan Note (Signed)
CPAP download was reviewed which shows excellent control of events on auto settings 8 to 12 cm with average pressure of 11 cm, mild leak and compliance sporadic but more than 4 hours on average  Weight loss encouraged, compliance with goal of at least 4-6 hrs every night is the expectation. Advised against medications with sedative side effects Cautioned against driving when sleepy - understanding that sleepiness will vary on a day to day basis

## 2020-08-27 NOTE — Addendum Note (Signed)
Addended by: Melonie [place] on: 08/27/2020 04:23 PM   Modules accepted: Orders

## 2020-08-27 NOTE — Progress Notes (Signed)
   Subjective:    Patient ID: Lindsey Copeland, female    DOB: 11/09/1960, 60 y.o.   MRN: [id number]  HPI  60 yo former smoker followed for Sarcoid and moderate OSA She had asthma as a child, outgrew it and then symptoms returned after a chest cold in winter of 2013.   She was restarted on CPAP in 2017 after an initial failure in 2014  PMH -  Graves' disease s/p RAI , esophageal dysmotility s/p esophageal dilatation  Visual migraines glaucoma   Chief Complaint  Patient presents with  . Follow-up    No complaints currently   She had Covid infection in December, was vaccinated and boosted, very mild illness, used albuterol nebs. Occasional sputum early morning minimal yellow, no dyspnea or wheezing. She had coronary CT because she had chest pain and saw cardiologist, we reviewed.  Needs refills on medications.  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 Nonsupine AHI was only 3 events per hour.   PFTs 07/2012 nml lung volumes, no obs, DLCO 71% PFT 10/2014 >>ratio 80, FVC 104%, FEV1 108%, DLCO 83%-normal 06/2017 spiro >> nml, FVC 92%   CT chest 05/2013 Mediastinal/bilateral hilar lymphadenopathy, Peribronchovascular nodularity throughout all lobes, numerous lesions in the spleen. HRCT 08/2017 >>decreased lymphadenopathy, mild 1 to 2 mm nodules along fissures consistent with sarcoidosis  CT coronaries 06/2020 no significant lung disease  Echo 09/2017 grade 1 DD  Review of Systems neg for any significant sore throat, dysphagia, itching, sneezing, nasal congestion or excess/ purulent secretions, fever, chills, sweats, unintended wt loss, pleuritic or exertional cp, hempoptysis, orthopnea pnd  or change in chronic leg swelling. Also denies presyncope, palpitations, heartburn, abdominal pain, nausea, vomiting, diarrhea or change in bowel or urinary habits, dysuria,hematuria, rash, arthralgias, visual complaints, headache, numbness weakness or ataxia.     Objective:   Physical Exam  Gen. Pleasant, obese, in no distress ENT - no lesions, no post nasal drip Neck: No JVD, no thyromegaly, no carotid bruits Lungs: no use of accessory muscles, no dullness to percussion, decreased without rales or rhonchi  Cardiovascular: Rhythm regular, heart sounds  normal, no murmurs or gallops, no peripheral edema Musculoskeletal: No deformities, no cyanosis or clubbing , no tremors       Assessment & Plan:

## 2020-10-24 ENCOUNTER — Other Ambulatory Visit: Payer: Self-pay | Admitting: Adult Health

## 2020-11-12 ENCOUNTER — Telehealth: Payer: Self-pay

## 2020-11-12 DIAGNOSIS — Z006 Encounter for examination for normal comparison and control in clinical research program: Secondary | ICD-10-CM

## 2020-11-12 NOTE — Telephone Encounter (Signed)
Called patient for 90 day Identify phone call pt stated she is still having the cardiac symptoms and she has followed up with PCP, I reminded the patient that we would be calling her back around January for a year follow up phone call.  

## 2021-02-04 ENCOUNTER — Other Ambulatory Visit: Payer: Self-pay | Admitting: Adult Health

## 2021-03-26 ENCOUNTER — Telehealth: Payer: Self-pay | Admitting: Pulmonary Disease

## 2021-03-26 MED ORDER — SYMBICORT 160-4.5 MCG/ACT IN AERO: INHALATION_SPRAY | RESPIRATORY_TRACT | 0 refills | Status: DC

## 2021-03-26 NOTE — Telephone Encounter (Signed)
I have sent the rx for the symbicort to the pharmacy.  I have called and LM on VM to make the pt aware.  Nothing further is needed.

## 2021-04-10 ENCOUNTER — Ambulatory Visit (INDEPENDENT_AMBULATORY_CARE_PROVIDER_SITE_OTHER): Payer: Medicare Other | Admitting: Primary Care

## 2021-04-10 ENCOUNTER — Other Ambulatory Visit: Payer: Self-pay

## 2021-04-10 ENCOUNTER — Ambulatory Visit (INDEPENDENT_AMBULATORY_CARE_PROVIDER_SITE_OTHER): Payer: Medicare Other

## 2021-04-10 ENCOUNTER — Encounter: Payer: Self-pay | Admitting: Primary Care

## 2021-04-10 VITALS — BP 110/64 | HR 68 | Temp 98.5°F | Ht 60.0 in | Wt 197.0 lb

## 2021-04-10 DIAGNOSIS — J45901 Unspecified asthma with (acute) exacerbation: Secondary | ICD-10-CM

## 2021-04-10 DIAGNOSIS — D862 Sarcoidosis of lung with sarcoidosis of lymph nodes: Secondary | ICD-10-CM

## 2021-04-10 DIAGNOSIS — R051 Acute cough: Secondary | ICD-10-CM

## 2021-04-10 DIAGNOSIS — G4733 Obstructive sleep apnea (adult) (pediatric): Secondary | ICD-10-CM

## 2021-04-10 MED ORDER — PREDNISONE 10 MG PO TABS: ORAL_TABLET | ORAL | 0 refills | Status: DC

## 2021-04-10 MED ORDER — AMOXICILLIN-POT CLAVULANATE 875-125 MG PO TABS: 1.0000 | ORAL_TABLET | Freq: Two times a day (BID) | ORAL | 0 refills | Status: DC

## 2021-04-10 NOTE — Assessment & Plan Note (Signed)
-   CXR today showed no evidence of interstitial lung disease or adenopathy to suggest active sarcoidosis. Checking ACE level

## 2021-04-10 NOTE — Assessment & Plan Note (Signed)
-   Patient is 78% complaint with CPAP > 4 hours from 03/11/21-04/09/21  - Pressure 8-12cm h20 (11.3cm h20- 95%); AHI 1.3 - No changes, continue to encourage consistent use of CPAP

## 2021-04-10 NOTE — Patient Instructions (Signed)
   CXR showed no acute process. Specifically, no evidence of interstitial lung disease or adenopathy to suggest sarcoidosis  Treating for acute asthmatic bronchitis   Recommendations: Start Augmentin and prednisone taper  Continue Symbicort as directed Use albuterol 2 puffs every 6 hours as needed for shortness of breath  Take delsym cough syrup at bedtime  Follow-up: Call if not better in 7-10 days

## 2021-04-10 NOTE — Progress Notes (Signed)
@Patient  ID: , female    DOB: 05/19/1961, 60 y.o.   MRN: [id number]  Chief Complaint  Patient presents with   Follow-up    Patient reports that she started having some shortness of breath (at rest and exertion) and some productive cough that is yellow, she had a neg covid test,.     Referring provider: [id number], MD  HPI: 60 year old female, former smoker quit in January 2004. PMH significant for asthma, sarcoid of lung, moderate OSA, GERD, graves disease, hyperlipidemia, obesity. Patient of Dr. February 2004, last seen on 08/27/20. Started on CPAP in 2017 after initial failure in 2014.  Previous [facility] pulmonary encounter: 08/27/20- DR. 08/29/20  She had Covid infection in December, was vaccinated and boosted, very mild illness, used albuterol nebs.Occasional sputum early morning minimal yellow, no dyspnea or wheezing. She had coronary CT because she had chest pain and saw cardiologist, we reviewed.  Needs refills on medications.  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 She reports symptoms of increased sob and productive cough with yellow mucus production x 4-6 weeks. Associated chest tightness/heaviness.  Wakes up at night with phlegm in her throat which has made it difficult for her to consistently wear CPAP. Symptoms started around Labor day. She went out of town and was staying with family. She has been using Symbicort as prescribed twice daily, needing albuterol rescue inhaler two times a day on average. She tested negative with home covid test. Denies fever, chills, chest pain, hemoptysis.     Allergies  Allergen Reactions   Cinnamon Shortness Of Breath   Sulfa Antibiotics Hives    hives   Indomethacin Palpitations   Robaxin [Methocarbamol] Palpitations    Immunization History  Administered Date(s) Administered   Influenza Split  04/08/2013, 03/30/2014, 04/16/2016, 03/28/2017, 04/19/2019, 03/29/2020   Influenza Whole 04/30/2012   Influenza,inj,Quad PF,6+ Mos 03/27/2015, 04/18/2016, 03/23/2017, 05/19/2018, 03/15/2019   Influenza-Unspecified 03/30/2014   PFIZER Comirnaty(Gray Top)Covid-19 Tri-Sucrose Vaccine 10/16/2020   PFIZER(Purple Top)SARS-COV-2 Vaccination 07/30/2019, 08/28/2019, 04/20/2020   Pneumococcal Polysaccharide-23 08/21/2017   Td 12/15/2002   Tdap 12/16/2011    Past Medical History:  Diagnosis Date   Arthritis    Asthma    Carpal tunnel syndrome    COPD (chronic obstructive pulmonary disease) (HCC)    sarcoidosis   Dysrhythmia    palpitations   GERD (gastroesophageal reflux disease)    Graves disease    Hyperlipidemia    Hyperthyroidism    Leaky heart valve    Lumbar spinal stenosis    Sarcoidosis    Sleep apnea    CPAP   Type 2 diabetes mellitus (HCC)    Vitamin D deficiency     Tobacco History: Social History   Tobacco Use  Smoking Status Former   Packs/day: 0.10   Years: 10.00   Pack years: 1.00   Types: Cigarettes   Quit date: 06/30/2002   Years since quitting: 18.7  Smokeless Tobacco Never   Counseling given: Not Answered   Outpatient Medications Prior to Visit  Medication Sig Dispense Refill   albuterol (PROVENTIL) (2.5 MG/3ML) 0.083% nebulizer solution Take 3 mLs (2.5 mg total) by nebulization every 6 (six) hours as needed for wheezing or shortness of breath (every 6 hours AND as needed for SOB). 75 mL 6   aspirin 81 MG tablet Take 81 mg by mouth  daily.     Calcium Carbonate Antacid (TUMS E-X 750 PO) Take by mouth as needed.     Cholecalciferol (VITAMIN D-3) 1000 UNITS CAPS Take 1 capsule by mouth daily.      fluticasone (FLONASE) 50 MCG/ACT nasal spray Place 2 sprays into both nostrils daily as needed for allergies or rhinitis. 48 g 3   gabapentin (NEURONTIN) 300 MG capsule Take by mouth.     ibuprofen (ADVIL,MOTRIN) 800 MG tablet Take 800 mg by mouth every 8 (eight)  hours as needed.     ketoconazole (NIZORAL) 2 % cream Apply 1 application topically 2 (two) times daily.     Lifitegrast (XIIDRA) 5 % SOLN Place 1 drop into both eyes 2 times daily.     metFORMIN (GLUCOPHAGE) 500 MG tablet Take 1,000 mg by mouth every evening.      Multiple Vitamin (MULTIVITAMIN) tablet Take 1 tablet by mouth daily.     omeprazole (PRILOSEC) 20 MG capsule Take 20 mg by mouth daily as needed (indigestion).      PROAIR HFA 108 (90 Base) MCG/ACT inhaler USE 2 INHALATIONS EVERY 6 HOURS AS NEEDED FOR WHEEZING OR SHORTNESS OF BREATH 8.5 g 13   SUMAtriptan (IMITREX) 50 MG tablet Take 1 tablet (50 mg total) by mouth every 2 (two) hours as needed for migraine. May repeat in 2 hours if headache persists or recurs. 10 tablet 6   SYMBICORT 160-4.5 MCG/ACT inhaler USE 2 INHALATIONS TWICE A DAY 10 g 0   venlafaxine XR (EFFEXOR-XR) 75 MG 24 hr capsule Take 1 capsule (75 mg total) by mouth daily. 90 capsule 4   No facility-administered medications prior to visit.    Review of Systems  Review of Systems  Constitutional:  Positive for diaphoresis. Negative for fever.  HENT:  Positive for congestion and postnasal drip.   Respiratory:  Positive for cough, chest tightness and shortness of breath. Negative for wheezing.     Physical Exam  BP 110/64 (BP Location: Left Arm, Patient Position: Sitting, Cuff Size: Normal)   Pulse 68   Temp 98.5 F (36.9 C) (Oral)   Ht 5' (1.524 m)   Wt 197 lb (89.4 kg)   SpO2 96%   BMI 38.47 kg/m  Physical Exam Constitutional:      Appearance: Normal appearance.  HENT:     Head: Normocephalic and atraumatic.  Cardiovascular:     Rate and Rhythm: Normal rate and regular rhythm.  Pulmonary:     Effort: Pulmonary effort is normal.     Breath sounds: No wheezing, rhonchi or rales.  Skin:    General: Skin is warm and dry.  Neurological:     General: No focal deficit present.     Mental Status: She is alert and oriented to person, place, and time.  Mental status is at baseline.  Psychiatric:        Mood and Affect: Mood normal.        Behavior: Behavior normal.        Thought Content: Thought content normal.        Judgment: Judgment normal.     Lab Results:  CBC    Component Value Date/Time   HGB 13.6 10/09/2015 0730   HCT 40.0 10/09/2015 0730    BMET    Component Value Date/Time   NA 143 07/25/2020 1112   K 4.4 07/25/2020 1112   CL 105 07/25/2020 1112   CO2 23 07/25/2020 1112   GLUCOSE 87 07/25/2020 1112   GLUCOSE 93  10/09/2015 0730   BUN 17 07/25/2020 1112   CREATININE 0.80 07/25/2020 1112   CALCIUM 9.2 07/25/2020 1112   GFRNONAA 80 07/25/2020 1112   GFRAA 92 07/25/2020 1112    BNP No results found for: BNP  ProBNP No results found for: PROBNP  Imaging: DG Chest 2 View  Result Date: 04/10/2021 CLINICAL DATA:  Cough, history of sarcoid EXAM: CHEST - 2 VIEW COMPARISON:  02/06/2020 FINDINGS: Frontal and lateral views of the chest demonstrate an unremarkable cardiac silhouette. No airspace disease, effusion, or pneumothorax. Specifically, no evidence of interstitial lung disease or adenopathy to suggest sarcoidosis. No acute bony abnormalities. IMPRESSION: 1. Stable chest, no acute process. Electronically Signed   By: Sharlet Salina M.D.   On: 04/10/2021 16:03     Assessment & Plan:   OSA (obstructive sleep apnea) - Patient is 78% complaint with CPAP > 4 hours from 03/11/21-04/09/21  - Pressure 8-12cm h20 (11.3cm h20- 95%); AHI 1.3 - No changes, continue to encourage consistent use of CPAP  Asthmatic bronchitis with acute exacerbation - Symptoms are consistent with acute bronchitis/asthma exacerbtion. Sending in RX Augmentin 1 tab twice daily x 7 days and prednisone taper (40mg  x 2 days, 30mg  x 2 days, 20mg  x 2 days, 10mg  x 2 day). Continue Symbicort 160 two puffs twice daily and prn albuterol hfa 2 puffs q 6 hours for breakthrough sob/wheezing. Patient to call office if symptoms do not improve or worsen.    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 Checking ACE level   , NP 04/10/2021

## 2021-04-10 NOTE — Assessment & Plan Note (Addendum)
-   Symptoms are consistent with acute bronchitis/asthma exacerbtion. Sending in RX Augmentin 1 tab twice daily x 7 days and prednisone taper (40mg  x 2 days, 30mg  x 2 days, 20mg  x 2 days, 10mg  x 2 day). Continue Symbicort 160 two puffs twice daily and prn albuterol hfa 2 puffs q 6 hours for breakthrough sob/wheezing. Patient to call office if symptoms do not improve or worsen.

## 2021-04-11 LAB — CBC WITH DIFFERENTIAL/PLATELET
Basophils Absolute: 0.1 10*3/uL (ref 0.0–0.1)
Basophils Relative: 0.5 % (ref 0.0–3.0)
Eosinophils Absolute: 0.2 10*3/uL (ref 0.0–0.7)
Eosinophils Relative: 1.4 % (ref 0.0–5.0)
HCT: 38 % (ref 36.0–46.0)
Hemoglobin: 12.1 g/dL (ref 12.0–15.0)
Lymphocytes Relative: 30.1 % (ref 12.0–46.0)
Lymphs Abs: 3.5 10*3/uL (ref 0.7–4.0)
MCHC: 31.9 g/dL (ref 30.0–36.0)
MCV: 85.9 fl (ref 78.0–100.0)
Monocytes Absolute: 0.7 10*3/uL (ref 0.1–1.0)
Monocytes Relative: 6.4 % (ref 3.0–12.0)
Neutro Abs: 7.1 10*3/uL (ref 1.4–7.7)
Neutrophils Relative %: 61.6 % (ref 43.0–77.0)
Platelets: 227 10*3/uL (ref 150.0–400.0)
RBC: 4.43 Mil/uL (ref 3.87–5.11)
RDW: 14.7 % (ref 11.5–15.5)
WBC: 11.6 10*3/uL — ABNORMAL HIGH (ref 4.0–10.5)

## 2021-04-12 LAB — ANGIOTENSIN CONVERTING ENZYME: Angiotensin-Converting Enzyme: 42 U/L (ref 9–67)

## 2021-04-25 ENCOUNTER — Other Ambulatory Visit: Payer: Self-pay | Admitting: Pulmonary Disease

## 2021-08-06 ENCOUNTER — Ambulatory Visit: Payer: Medicare Other | Admitting: Cardiology

## 2021-08-20 ENCOUNTER — Ambulatory Visit (INDEPENDENT_AMBULATORY_CARE_PROVIDER_SITE_OTHER): Payer: Medicare Other | Admitting: Pulmonary Disease

## 2021-08-20 ENCOUNTER — Other Ambulatory Visit: Payer: Self-pay

## 2021-08-20 ENCOUNTER — Encounter: Payer: Self-pay | Admitting: Pulmonary Disease

## 2021-08-20 DIAGNOSIS — G4733 Obstructive sleep apnea (adult) (pediatric): Secondary | ICD-10-CM | POA: Diagnosis not present

## 2021-08-20 DIAGNOSIS — J45998 Other asthma: Secondary | ICD-10-CM | POA: Diagnosis not present

## 2021-08-20 MED ORDER — SYMBICORT 160-4.5 MCG/ACT IN AERO: INHALATION_SPRAY | RESPIRATORY_TRACT | 2 refills | Status: DC

## 2021-08-20 NOTE — Patient Instructions (Signed)
°  Try to use CPAP at least 6h every night Refills on symbicort CPAP supplies will be renewed
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
## 2021-08-20 NOTE — Assessment & Plan Note (Signed)
CPAP download was reviewed which shows good control of events on auto settings 8 to 12 cm with average pressure of 11 cm, average compliance is 4 hours per night. We discussed alternative therapy including hypoglossal nerve stimulation -she does not like the idea of an implant   Weight loss encouraged, compliance with goal of at least 4-6 hrs every night is the expectation. Advised against medications with sedative side effects Cautioned against driving when sleepy - understanding that sleepiness will vary on a day to day basis

## 2021-08-20 NOTE — Assessment & Plan Note (Signed)
Continue Symbicort, refills will be provided. Use albuterol for rescue. We discussed environmental control

## 2021-08-26 ENCOUNTER — Telehealth: Payer: Self-pay | Admitting: Pulmonary Disease

## 2021-08-26 NOTE — Telephone Encounter (Signed)
Lm for patient.  

## 2021-09-09 NOTE — Progress Notes (Unsigned)
Cardiology Office Note:    Date:  09/09/2021   ID:  Heidi Maclin, DOB September 11, 1960, MRN [id number]  PCP:  Jarrett Soho, PA-C   [facility] Providers Cardiologist:  Armanda Magic, MD { Click to update primary MD,subspecialty MD or APP then REFRESH:1}    Referring MD: Jarrett Soho, PA-C   No chief complaint on file. ***  History of Present Illness:    Kalenna Millett is a 61 y.o. female with a hx of OSA, sarcoidosis, GERD, hyperlipidemia,   She was last seen in our office by Dr. Mayford Knife on 07/11/20 for evaluation of chest pain and shortness of breath.  Additionally she has multiple risk factors for CAD including diabetes, dyslipidemia, and remote tobacco use.  Nuclear stress test in 2019 showed no ischemia.  She described chest pain as a pinching feeling that had worsened since COVID infection 06/24/2020 Coronary CT revealed calcium score of zero Echocardiogram revealed normal LVEF 60 to 65%, G1 DD, trivial MR.     Past Medical History:  Diagnosis Date   Arthritis    Asthma    Carpal tunnel syndrome    COPD (chronic obstructive pulmonary disease) (HCC)    sarcoidosis   Dysrhythmia    palpitations   GERD (gastroesophageal reflux disease)    Graves disease    Hyperlipidemia    Hyperthyroidism    Leaky heart valve    Lumbar spinal stenosis    Sarcoidosis    Sleep apnea    CPAP   Type 2 diabetes mellitus (HCC)    Vitamin D deficiency     Past Surgical History:  Procedure Laterality Date   CARPAL TUNNEL RELEASE Right 02/20/2015   Procedure: RIGHT CARPAL TUNNEL RELEASE;  Surgeon: Cindee Salt, MD;  Location: [facility];  Service: Orthopedics;  Laterality: Right;  ANESTHESIA:  IV REGIONAL FAB   CARPAL TUNNEL RELEASE Left 10/09/2015   Procedure: LEFT CARPAL TUNNEL RELEASE;  Surgeon: Cindee Salt, MD;  Location: [facility];  Service: Orthopedics;  Laterality: Left;   radioactive iodine treatment     right knee arthroscopy     SHOULDER  ARTHROSCOPY     TUBAL LIGATION     ULNAR NERVE REPAIR     VIDEO BRONCHOSCOPY  07/30/2012   Procedure: VIDEO BRONCHOSCOPY WITH FLUORO;  Surgeon: Storm Frisk, MD;  Location: [facility];  Service: Cardiopulmonary;  Laterality: N/A;    Current Medications: No outpatient medications have been marked as taking for the 3/63/23 encounter (Appointment) with Lissa Hoard, Zachary George, NP.     Allergies:   Cinnamon, Sulfa antibiotics, Indomethacin, and Robaxin [methocarbamol]   Social History   Socioeconomic History   Marital status: Married    Spouse name: Not on file   Number of children: 3   Years of education: Not on file   Highest education level: Not on file  Occupational History   Occupation: Child psychotherapist  Tobacco Use   Smoking status: Former    Packs/day: 0.10    Years: 10.00    Pack years: 1.00    Types: Cigarettes    Quit date: 06/30/2002    Years since quitting: 19.2   Smokeless tobacco: Never  Vaping Use   Vaping Use: Never used  Substance and Sexual Activity   Alcohol use: Yes    Comment: occasionally   Drug use: No   Sexual activity: Not on file  Other Topics Concern   Not on file  Social History Narrative   Not on file  Social Determinants of Health   Financial Resource Strain: Not on file  Food Insecurity: Not on file  Transportation Needs: Not on file  Physical Activity: Not on file  Stress: Not on file  Social Connections: Not on file     Family History: The patient's ***family history includes Asthma in her brother; Breast cancer in her sister; Diabetes in her mother and sister; Glaucoma in her mother; Heart attack in her brother; Heart failure in her mother; Pancreatic cancer in her maternal aunt. There is no history of Colon cancer or Stomach cancer.  ROS:   Please see the history of present illness.    *** All other systems reviewed and are negative.  Labs/Other Studies Reviewed:    The following studies were reviewed today:  Echo  07/2020  Left Ventricle: Left ventricular ejection fraction, by estimation, is 60  to 65%. The left ventricle has normal function. The left ventricle has no  regional wall motion abnormalities. The average left ventricular global  longitudinal strain is -22.5 %.  The global longitudinal strain is normal. The left ventricular internal  cavity size was normal in size. There is no left ventricular hypertrophy.  Left ventricular diastolic parameters are consistent with Grade I  diastolic dysfunction (impaired  relaxation). Indeterminate filling pressures.   Right Ventricle: The right ventricular size is normal. No increase in  right ventricular wall thickness. Right ventricular systolic function is  normal. There is normal pulmonary artery systolic pressure. The tricuspid  regurgitant velocity is 2.81 m/s, and   with an assumed right atrial pressure of 3 mmHg, the estimated right  ventricular systolic pressure is 34.6 mmHg.   Left Atrium: Left atrial size was normal in size.   Right Atrium: Right atrial size was normal in size.   Pericardium: There is no evidence of pericardial effusion.   Mitral Valve: The mitral valve is grossly normal. Trivial mitral valve  regurgitation.   Tricuspid Valve: The tricuspid valve is grossly normal. Tricuspid valve  regurgitation is trivial.   Aortic Valve: The aortic valve is tricuspid. Aortic valve regurgitation is  not visualized.   Pulmonic Valve: The pulmonic valve was normal in structure. Pulmonic valve  regurgitation is trivial.   Aorta: The aortic root and ascending aorta are structurally normal, with  no evidence of dilitation.   Venous: The inferior vena cava is normal in size with greater than 50%  respiratory variability, suggesting right atrial pressure of 3 mmHg.   IAS/Shunts: No atrial level shunt detected by color flow Doppler.   Cor CT 07/30/20  1. Coronary calcium score of 0. This was 0 percentile for age and sex matched  control.   2.  Normal coronary origin with right dominance.   3.  No evidence of CAD.  CAD RADS 0.   4.  Consider non atherosclerotic causes of chest pain.    Recent Labs: 04/10/2021: Hemoglobin 12.1; Platelets 227.0  Recent Lipid Panel No results found for: CHOL, TRIG, HDL, CHOLHDL, VLDL, LDLCALC, LDLDIRECT   Risk Assessment/Calculations:   {Does this patient have ATRIAL FIBRILLATION?:[phone]}       Physical Exam:    VS:  There were no vitals taken for this visit.    Wt Readings from Last 3 Encounters:  08/20/21 194 lb (88 kg)  04/10/21 197 lb (89.4 kg)  08/27/20 195 lb 3.2 oz (88.5 kg)     GEN: *** Well nourished, well developed in no acute distress HEENT: Normal NECK: No JVD; No carotid bruits CARDIAC: ***RRR,  no murmurs, rubs, gallops RESPIRATORY:  Clear to auscultation without rales, wheezing or rhonchi  ABDOMEN: Soft, non-tender, non-distended MUSCULOSKELETAL:  No edema; No deformity. *** pedal pulses, ***bilaterally SKIN: Warm and dry NEUROLOGIC:  Alert and oriented x 3 PSYCHIATRIC:  Normal affect   EKG:  EKG is *** ordered today.  The ekg ordered today demonstrates ***  Diagnoses:    No diagnosis found. Assessment and Plan:     ***          {Are you ordering a CV Procedure (e.g. stress test, cath, DCCV, TEE, etc)?   Press F2        :[id number]}    Medication Adjustments/Labs and Tests Ordered: Current medicines are reviewed at length with the patient today.  Concerns regarding medicines are outlined above.  No orders of the defined types were placed in this encounter.  No orders of the defined types were placed in this encounter.   There are no Patient Instructions on file for this visit.   Signed, Levi AlandSwinyer, Kenitra Leventhal M, NP  09/09/2021 3:28 PM    [facility]

## 2021-09-10 ENCOUNTER — Ambulatory Visit (INDEPENDENT_AMBULATORY_CARE_PROVIDER_SITE_OTHER): Payer: Medicare Other | Admitting: Nurse Practitioner

## 2021-09-10 ENCOUNTER — Other Ambulatory Visit: Payer: Self-pay

## 2021-09-10 ENCOUNTER — Encounter: Payer: Self-pay | Admitting: Nurse Practitioner

## 2021-09-10 VITALS — BP 122/70 | HR 75 | Ht 60.0 in | Wt 192.0 lb

## 2021-09-10 DIAGNOSIS — I371 Nonrheumatic pulmonary valve insufficiency: Secondary | ICD-10-CM

## 2021-09-10 DIAGNOSIS — E785 Hyperlipidemia, unspecified: Secondary | ICD-10-CM

## 2021-09-10 DIAGNOSIS — I361 Nonrheumatic tricuspid (valve) insufficiency: Secondary | ICD-10-CM

## 2021-09-10 DIAGNOSIS — R079 Chest pain, unspecified: Secondary | ICD-10-CM

## 2021-09-10 DIAGNOSIS — I340 Nonrheumatic mitral (valve) insufficiency: Secondary | ICD-10-CM | POA: Diagnosis not present

## 2021-09-10 DIAGNOSIS — I5032 Chronic diastolic (congestive) heart failure: Secondary | ICD-10-CM

## 2021-09-10 DIAGNOSIS — R0609 Other forms of dyspnea: Secondary | ICD-10-CM

## 2021-09-10 DIAGNOSIS — Z7189 Other specified counseling: Secondary | ICD-10-CM

## 2021-09-10 NOTE — Patient Instructions (Signed)
Medication Instructions:  ? ?Your physician recommends that you continue on your current medications as directed. Please refer to the Current Medication list given to you today. ? ? ?*If you need a refill on your cardiac medications before your next appointment, please call your pharmacy* ? ? ?Follow-Up: ?At [facility], you and your health needs are our priority.  As part of our continuing mission to provide you with exceptional heart care, we have created designated Provider Care Teams.  These Care Teams include your primary Cardiologist (physician) and Advanced Practice Providers (APPs -  Physician Assistants and Nurse Practitioners) who all work together to provide you with the care you need, when you need it. ? ?We recommend signing up for the patient portal called "MyChart".  Sign up information is provided on this After Visit Summary.  MyChart is used to connect with patients for Virtual Visits (Telemedicine).  Patients are able to view lab/test results, encounter notes, upcoming appointments, etc.  Non-urgent messages can be sent to your provider as well.   ?To learn more about what you can do with MyChart, go to [email or web address].   ? ?Your next appointment:   ?1 year(s) ? ?The format for your next appointment:   ?In Person ? ?Provider:   ?Armanda Magic, MD   ? ? ?Other Instructions ? ?Your physician wants you to follow-up in: 1 year with Dr.Turner.  You will receive a reminder letter in the mail two months in advance. If you don't receive a letter, please call our office to schedule the follow-up appointment. ?  ?

## 2021-10-09 NOTE — Telephone Encounter (Signed)
Called and spoke with patient. She stated that she was able to get the Symbicort converted to a 3 mo supply and received her shipment a few days ago.  ? ?Nothing further needed at time of call.  ?

## 2021-10-28 ENCOUNTER — Ambulatory Visit (INDEPENDENT_AMBULATORY_CARE_PROVIDER_SITE_OTHER): Payer: Medicare Other

## 2021-10-28 ENCOUNTER — Encounter: Payer: Self-pay | Admitting: Nurse Practitioner

## 2021-10-28 ENCOUNTER — Ambulatory Visit (INDEPENDENT_AMBULATORY_CARE_PROVIDER_SITE_OTHER): Payer: Medicare Other | Admitting: Nurse Practitioner

## 2021-10-28 VITALS — BP 122/68 | HR 74 | Temp 98.6°F | Ht 60.0 in | Wt 190.0 lb

## 2021-10-28 DIAGNOSIS — J45901 Unspecified asthma with (acute) exacerbation: Secondary | ICD-10-CM

## 2021-10-28 DIAGNOSIS — J309 Allergic rhinitis, unspecified: Secondary | ICD-10-CM | POA: Insufficient documentation

## 2021-10-28 DIAGNOSIS — G4733 Obstructive sleep apnea (adult) (pediatric): Secondary | ICD-10-CM

## 2021-10-28 DIAGNOSIS — J302 Other seasonal allergic rhinitis: Secondary | ICD-10-CM | POA: Diagnosis not present

## 2021-10-28 MED ORDER — PREDNISONE 10 MG PO TABS: ORAL_TABLET | ORAL | 0 refills | Status: DC

## 2021-10-28 MED ORDER — DOXYCYCLINE HYCLATE 100 MG PO TABS: 100.0000 mg | ORAL_TABLET | Freq: Two times a day (BID) | ORAL | 0 refills | Status: DC
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
## 2021-10-28 NOTE — Progress Notes (Signed)
CXR clear and without any evidence of pneumonia. Thanks.

## 2021-10-28 NOTE — Patient Instructions (Addendum)
Continue Symbicort 2 puffs Twice daily. Brush tongue and rinse mouth afterwards  ?Continue Albuterol inhaler 2 puffs or 3 mL neb every 6 hours as needed for shortness of breath or wheezing. Notify if symptoms persist despite rescue inhaler/neb use. ?Continue flonase 2 sprays each nostril daily  ?Continue omeprazole 20 mg daily as needed for indigestion  ?Continue Zyrtec daily  ?Continue CPAP nightly ? ?-Mucinex DM 600 mg Twice daily for chest congestion/cough ?-Prednisone taper. 4 tabs for 2 days, then 3 tabs for 2 days, 2 tabs for 2 days, then 1 tab for 2 days, then stop. Take in AM with food ?-Doxycycline 1 tab Twice daily for 7 days. Take with food. Wear sunscreen and avoid direct sun exposure while taking  ?-Saline nasal spray 2-3 times a day  ?-Chlorpheniramine 4 mg at bedtime as needed for cough  ? ?Follow up in 4-6 weeks with Dr. Elsworth Soho or Alanson Aly. If symptoms do not improve or worsen, please contact office for sooner follow up or seek emergency care. ?
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
## 2021-10-28 NOTE — Assessment & Plan Note (Signed)
Well-controlled on current regimen.  Continue intranasal steroid for postnasal drainage control and trigger prevention with Zyrtec. ?

## 2021-10-28 NOTE — Assessment & Plan Note (Signed)
Continues to receive good benefit.  Previous download showed excellent compliance. ?

## 2021-11-28 ENCOUNTER — Ambulatory Visit (INDEPENDENT_AMBULATORY_CARE_PROVIDER_SITE_OTHER): Payer: Medicare Other | Admitting: Pulmonary Disease

## 2021-11-28 ENCOUNTER — Encounter: Payer: Self-pay | Admitting: Pulmonary Disease

## 2021-11-28 DIAGNOSIS — J45901 Unspecified asthma with (acute) exacerbation: Secondary | ICD-10-CM | POA: Diagnosis not present

## 2021-11-28 DIAGNOSIS — G4733 Obstructive sleep apnea (adult) (pediatric): Secondary | ICD-10-CM | POA: Diagnosis not present

## 2021-11-28 DIAGNOSIS — D862 Sarcoidosis of lung with sarcoidosis of lymph nodes: Secondary | ICD-10-CM | POA: Diagnosis not present

## 2021-11-28 MED ORDER — AMOXICILLIN-POT CLAVULANATE 875-125 MG PO TABS: 1.0000 | ORAL_TABLET | Freq: Two times a day (BID) | ORAL | 0 refills | Status: DC

## 2021-11-28 NOTE — Assessment & Plan Note (Signed)
Compliant with CPAP 

## 2021-11-28 NOTE — Assessment & Plan Note (Signed)
No evidence of lymphadenopathy or sarcoid flare
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
## 2021-11-28 NOTE — Patient Instructions (Addendum)
  X augmentin 875 bid x 7 days  Continue on symbicort & albuterol as needed  X schedule spirometry   Call if no better in 1 week for another round of prednisone
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
## 2021-12-10 ENCOUNTER — Telehealth: Payer: Self-pay | Admitting: Pulmonary Disease

## 2021-12-12 MED ORDER — SYMBICORT 160-4.5 MCG/ACT IN AERO: INHALATION_SPRAY | RESPIRATORY_TRACT | 2 refills | Status: DC

## 2021-12-12 NOTE — Telephone Encounter (Signed)
Called patient but she did not answer. Symbicort has been sent to her pharmacy.

## 2022-02-11 ENCOUNTER — Other Ambulatory Visit: Payer: Self-pay | Admitting: Adult Health

## 2022-02-17 ENCOUNTER — Ambulatory Visit (INDEPENDENT_AMBULATORY_CARE_PROVIDER_SITE_OTHER): Payer: Medicare Other | Admitting: Adult Health

## 2022-02-17 ENCOUNTER — Encounter: Payer: Self-pay | Admitting: Adult Health

## 2022-02-17 DIAGNOSIS — G4733 Obstructive sleep apnea (adult) (pediatric): Secondary | ICD-10-CM

## 2022-02-17 DIAGNOSIS — D862 Sarcoidosis of lung with sarcoidosis of lymph nodes: Secondary | ICD-10-CM

## 2022-02-17 DIAGNOSIS — J45998 Other asthma: Secondary | ICD-10-CM | POA: Diagnosis not present

## 2022-02-17 MED ORDER — SYMBICORT 160-4.5 MCG/ACT IN AERO
INHALATION_SPRAY | RESPIRATORY_TRACT | 2 refills | Status: DC
Start: 2022-02-17 — End: 2022-02-21

## 2022-02-17 NOTE — Assessment & Plan Note (Signed)
Healthy weight loss 

## 2022-02-17 NOTE — Assessment & Plan Note (Signed)
Recent mild flare with upper respiratory infection.  Seems to be improved and returning back to baseline. Continue current maintenance regimen  Plan  Patient Instructions  Continue on CPAP At bedtime  , try to wear each night for >6hr .  Work on healthy weight Do not drive if sleepy Saline nasal rinses As needed   Zyrtec 10mg  At bedtime as needed.   Continue on Symbicort twice daily. Rinse after use.  Albuterol inhaler As needed  .  Follow-up with Dr. or Ashlie Mcmenamy NP in 6 months and As needed   Please contact office for sooner follow up if symptoms do not improve or worsen or seek emergency care

## 2022-02-17 NOTE — Progress Notes (Signed)
@Patient  ID: , female    DOB: 1961/06/17, 61 y.o.   MRN: [id number]  Chief Complaint  Patient presents with   Follow-up    Referring provider: [id number]  HPI: 61 year old female former smoker followed for sarcoidosis, asthma and moderate obstructive sleep apnea Medical history significant for Graves' disease status post radioactive iodine, esophageal dysmotility with previous esophageal dilatation by GI, diabetes, migraines  TEST/EVENTS :  Esophagram  12/2017 >>  Dysmotility & stasis >> referred to GI   01/2013 PSG -wt 190 pounds, BMI 34 showed predominant hypopneas during supine sleep and with AHI of 16 events per hour. Nonsupine AHI was only 3 events per hour.     PFTs 07/2012 nml lung volumes, no obs, DLCO 71% PFT 10/2014 >> ratio 80, FVC 104%, FEV1 108%, DLCO 83%-normal 06/2017 spiro >> nml, FVC 92%     CT chest 05/2013 Mediastinal/bilateral hilar lymphadenopathy, Peribronchovascular nodularity throughout all lobes, numerous lesions in the spleen.   HRCT 08/2017 >> decreased lymphadenopathy, mild 1 to 2 mm nodules along fissures consistent with sarcoidosis   Echo 09/2017 grade 1 DD   CXR 32020 - clear.,  Oct 28, 2021 chest x-ray is clear  CT coronaries 06/2020 no significant lung disease  02/17/2022 Follow up : Sarcoid , asthma, OSA  Patient returns for a 66-month follow-up.  Patient has underlying sarcoidosis and asthma.  Says she is recently had a bronchitis/URI. Seen at Urgent care 4-6 weeks ago. 3-month  She was treated with Augmentin.  Says symptoms are starting to improve.  Had a lot of sinus congestion and cough. She remains on Symbicort twice daily.  She denies any hemoptysis chest pain orthopnea PND or increased leg swelling activity level remains at baseline.  Chest x-ray in May showed clear lungs. Coughing up clear mucus. Nasal congestion is improving. Taking Zyrtec.  Spirometry November 28, 2021 showed normal lung function with FEV1 at 149%, ratio  94  Patient has underlying of sleep apnea.  Patient says since having a bronchitis over the last week or 2 she is had decreased CPAP usage.  She says she is starting to feel better she is try to use her CPAP more.  CPAP download shows 70% compliance.  Daily average usage at 4.5 hours.  Patient is on auto CPAP 8 to 12 cm H2O.  AHI is 1.2/hour.  Daily average pressure at 10.8 cm H2O. Uses nasal mask.      Allergies  Allergen Reactions   Sulfa Antibiotics Hives    hives   Indomethacin Palpitations   Robaxin [Methocarbamol] Palpitations    Immunization History  Administered Date(s) Administered   Influenza Split 04/08/2013, 03/30/2014, 04/16/2016, 03/28/2017, 04/19/2019, 03/29/2020, 04/30/2021   Influenza Whole 04/30/2012   Influenza,inj,Quad PF,6+ Mos 03/27/2015, 04/18/2016, 03/23/2017, 05/19/2018, 03/15/2019   Influenza-Unspecified 03/30/2014   PFIZER Comirnaty(Gray Top)Covid-19 Tri-Sucrose Vaccine 10/16/2020   PFIZER(Purple Top)SARS-COV-2 Vaccination 07/30/2019, 08/28/2019, 04/20/2020   Pfizer Covid-19 Vaccine Bivalent Booster 75yrs & up 05/28/2021   Pneumococcal Polysaccharide-23 08/21/2017   Td 12/15/2002   Tdap 12/16/2011    Past Medical History:  Diagnosis Date   Arthritis    Asthma    Carpal tunnel syndrome    COPD (chronic obstructive pulmonary disease) (HCC)    sarcoidosis   Dysrhythmia    palpitations   GERD (gastroesophageal reflux disease)    Graves disease    Hyperlipidemia    Hyperthyroidism    Leaky heart valve    Lumbar spinal stenosis    Sarcoidosis  Sleep apnea    CPAP   Type 2 diabetes mellitus (HCC)    Vitamin D deficiency     Tobacco History: Social History   Tobacco Use  Smoking Status Former   Packs/day: 0.10   Years: 10.00   Total pack years: 1.00   Types: Cigarettes   Quit date: 06/30/2002   Years since quitting: 19.6  Smokeless Tobacco Never   Counseling given: Not Answered   Outpatient Medications Prior to Visit  Medication  Sig Dispense Refill   albuterol (PROVENTIL) (2.5 MG/3ML) 0.083% nebulizer solution Take 3 mLs (2.5 mg total) by nebulization every 6 (six) hours as needed for wheezing or shortness of breath (every 6 hours AND as needed for SOB). 75 mL 6   aspirin 81 MG tablet Take 81 mg by mouth daily.     brimonidine (ALPHAGAN) 0.15 % ophthalmic solution      Calcium Carbonate Antacid (TUMS E-X 750 PO) Take by mouth as needed.     Cholecalciferol (VITAMIN D-3) 1000 UNITS CAPS Take 1 capsule by mouth daily.      fluticasone (FLONASE) 50 MCG/ACT nasal spray Place 2 sprays into both nostrils daily as needed for allergies or rhinitis. 48 g 3   gabapentin (NEURONTIN) 100 MG capsule Take 100 mg by mouth at bedtime.     ibuprofen (ADVIL,MOTRIN) 800 MG tablet Take 800 mg by mouth every 8 (eight) hours as needed.     ketoconazole (NIZORAL) 2 % cream Apply 1 application topically 2 (two) times daily.     latanoprost (XALATAN) 0.005 % ophthalmic solution 1 drop at bedtime.     Lifitegrast (XIIDRA) 5 % SOLN Place 1 drop into both eyes 2 times daily.     metFORMIN (GLUCOPHAGE) 500 MG tablet Take 1,000 mg by mouth 2 (two) times daily with a meal.     Multiple Vitamin (MULTIVITAMIN) tablet Take 1 tablet by mouth daily.     omeprazole (PRILOSEC) 20 MG capsule Take 20 mg by mouth daily as needed (indigestion).      pravastatin (PRAVACHOL) 80 MG tablet Take 80 mg by mouth daily.     PROAIR HFA 108 (90 Base) MCG/ACT inhaler USE 2 INHALATIONS EVERY 6 HOURS AS NEEDED FOR WHEEZING OR SHORTNESS OF BREATH 8.5 g 13   SUMAtriptan (IMITREX) 50 MG tablet Take 1 tablet (50 mg total) by mouth every 2 (two) hours as needed for migraine. May repeat in 2 hours if headache persists or recurs. 10 tablet 6   SYMBICORT 160-4.5 MCG/ACT inhaler INHALE 2 PUFFS BY MOUTH TWICE DAILY 10.2 g 2   venlafaxine XR (EFFEXOR-XR) 75 MG 24 hr capsule Take 1 capsule (75 mg total) by mouth daily. 90 capsule 4   amoxicillin-clavulanate (AUGMENTIN) 875-125 MG tablet  Take 1 tablet by mouth 2 (two) times daily. (Patient not taking: Reported on 02/17/2022) 14 tablet 0   No facility-administered medications prior to visit.     Review of Systems:   Constitutional:   No  weight loss, night sweats,  Fevers, chills,  +fatigue, or  lassitude.  HEENT:   No headaches,  Difficulty swallowing,  Tooth/dental problems, or  Sore throat,                No sneezing, itching, ear ache,  +nasal congestion, post nasal drip,   CV:  No chest pain,  Orthopnea, PND, swelling in lower extremities, anasarca, dizziness, palpitations, syncope.   GI  No heartburn, indigestion, abdominal pain, nausea, vomiting, diarrhea, change in bowel habits, loss  of appetite, bloody stools.   Resp: .  No chest wall deformity  Skin: no rash or lesions.  GU: no dysuria, change in color of urine, no urgency or frequency.  No flank pain, no hematuria   MS:  No joint pain or swelling.  No decreased range of motion.  No back pain.    Physical Exam  BP 120/76 (BP Location: Left Arm, Cuff Size: Normal)   Pulse 86   Temp 98.1 F (36.7 C) (Oral)   Ht 5' (1.524 m)   Wt 188 lb 3.2 oz (85.4 kg)   SpO2 98%   BMI 36.76 kg/m   GEN: A/Ox3; pleasant , NAD, well nourished    HEENT:  [place]/AT,  NOSE-clear, THROAT-clear, no lesions, no postnasal drip or exudate noted.   NECK:  Supple w/ fair ROM; no JVD; normal carotid impulses w/o bruits; no thyromegaly or nodules palpated; no lymphadenopathy.    RESP  Clear  P & A; w/o, wheezes/ rales/ or rhonchi. no accessory muscle use, no dullness to percussion  CARD:  RRR, no m/r/g, no peripheral edema, pulses intact, no cyanosis or clubbing.  GI:   Soft & nt; nml bowel sounds; no organomegaly or masses detected.   Musco: Warm bil, no deformities or joint swelling noted.   Neuro: alert, no focal deficits noted.    Skin: Warm, no lesions or rashes    Lab Results:   BNP No results found for: "BNP"  ProBNP No results found for:  "PROBNP"  Imaging: No results found.       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  Work on healthy weight Do not drive if sleepy Saline nasal rinses As needed   Zyrtec 10mg  At bedtime as needed.   Continue on Symbicort twice daily. Rinse after use.  Albuterol inhaler As needed  .  Follow-up with Dr. or Labib Cwynar NP in 6 months and As needed   Please contact office for sooner follow up if symptoms do not improve or worsen or seek emergency care      OSA (obstructive sleep apnea) Encouraged on usage  - discussed how weight can impact sleep and risk for sleep disordered breathing - discussed options to assist with weight loss: combination of diet modification, cardiovascular and strength training exercises   - had an extensive discussion regarding the adverse health consequences related to untreated sleep disordered breathing - specifically discussed the risks for hypertension, coronary artery disease, cardiac dysrhythmias, cerebrovascular disease, and diabetes - lifestyle modification discussed   - discussed how sleep disruption can increase risk of accidents, particularly when driving -  safe driving practices were discussed    Plan  Patient Instructions  Continue on CPAP At bedtime  , try to wear each night for >6hr .  Work on healthy  weight Do not drive if sleepy Saline nasal rinses As needed   Zyrtec 10mg  At bedtime as needed.   Continue on Symbicort twice daily. Rinse after use.  Albuterol inhaler As needed  .  Follow-up with Dr. or Yliana Gravois NP in 6 months and As needed   Please contact office for sooner follow up if symptoms do not improve or worsen or seek emergency care      Morbid obesity (HCC) Healthy weight loss.   Asthma, persistent controlled Recent mild flare with upper respiratory infection.  Seems to be improved and returning back to baseline. Continue current maintenance regimen  Plan  Patient Instructions  Continue on CPAP At bedtime  , try to wear each night for >6hr .  Work on healthy weight Do not drive if sleepy Saline nasal rinses As needed   Zyrtec 10mg  At bedtime as needed.   Continue on Symbicort twice daily. Rinse after use.  Albuterol inhaler As needed  .  Follow-up with Dr. Vassie Loll or Raine Blodgett NP in 6 months and As needed   Please contact office for sooner follow up if symptoms do not improve or worsen or seek emergency care        , NP 02/17/2022

## 2022-02-17 NOTE — Patient Instructions (Signed)
Continue on CPAP At bedtime  , try to wear each night for >6hr .  Work on healthy weight Do not drive if sleepy Saline nasal rinses As needed   Zyrtec 10mg  At bedtime as needed.   Continue on Symbicort twice daily. Rinse after use.  Albuterol inhaler As needed  .  Follow-up with Dr. or Damion Kant NP in 6 months and As needed   Please contact office for sooner follow up if symptoms do not improve or worsen or seek emergency care

## 2022-02-17 NOTE — Assessment & Plan Note (Signed)
Encouraged on usage  - discussed how weight can impact sleep and risk for sleep disordered breathing - discussed options to assist with weight loss: combination of diet modification, cardiovascular and strength training exercises   - had an extensive discussion regarding the adverse health consequences related to untreated sleep disordered breathing - specifically discussed the risks for hypertension, coronary artery disease, cardiac dysrhythmias, cerebrovascular disease, and diabetes - lifestyle modification discussed   - discussed how sleep disruption can increase risk of accidents, particularly when driving - safe driving practices were discussed    Plan  Patient Instructions  Continue on CPAP At bedtime  , try to wear each night for >6hr .  Work on healthy weight Do not drive if sleepy Saline nasal rinses As needed   Zyrtec 10mg  At bedtime as needed.   Continue on Symbicort twice daily. Rinse after use.  Albuterol inhaler As needed  .  Follow-up with Dr. or Azara Gemme NP in 6 months and As needed   Please contact office for sooner follow up if symptoms do not improve or worsen or seek emergency care

## 2022-02-17 NOTE — Assessment & Plan Note (Signed)
Does not appear to be active  Chest xray May clear   Plan  Patient Instructions  Continue on CPAP At bedtime  , try to wear each night for >6hr .  Work on healthy weight Do not drive if sleepy Saline nasal rinses As needed   Zyrtec 10mg  At bedtime as needed.   Continue on Symbicort twice daily. Rinse after use.  Albuterol inhaler As needed  .  Follow-up with Dr. or Leahna Hewson NP in 6 months and As needed   Please contact office for sooner follow up if symptoms do not improve or worsen or seek emergency care

## 2022-02-21 ENCOUNTER — Other Ambulatory Visit: Payer: Self-pay | Admitting: *Deleted

## 2022-02-21 MED ORDER — SYMBICORT 160-4.5 MCG/ACT IN AERO: INHALATION_SPRAY | RESPIRATORY_TRACT | 3 refills | Status: DC

## 2022-02-25 ENCOUNTER — Telehealth: Payer: Self-pay | Admitting: Internal Medicine

## 2022-02-26 NOTE — Telephone Encounter (Signed)
Called and spoke with one of the technicians regarding the prescription for the Symbicort.  Insurance does not cover the name brand, but will cover the generic.  They want to know if we want to change to the generic or do the PA.  Advised ok to change to the generic and to leave the directions as previously written.  She stated she would have it filled and get it shipped to the patient.  Nothing further needed.

## 2022-02-26 NOTE — Telephone Encounter (Signed)
Called and spoke with patient and advised her that insurance will not cover the name brand Symbicort, but will cover the generic.  I let her know it may look a little different, however, it is the same medication.  She verbalized understanding and appreciated the call letting her know. Nothing further needed.

## 2022-05-30 ENCOUNTER — Ambulatory Visit: Payer: Medicare Other | Admitting: Nurse Practitioner

## 2022-08-20 ENCOUNTER — Ambulatory Visit (INDEPENDENT_AMBULATORY_CARE_PROVIDER_SITE_OTHER): Payer: Medicare Other | Admitting: Adult Health

## 2022-08-20 ENCOUNTER — Encounter: Payer: Self-pay | Admitting: Adult Health

## 2022-08-20 VITALS — BP 108/60 | HR 95 | Temp 98.2°F | Ht 60.0 in | Wt 194.8 lb

## 2022-08-20 DIAGNOSIS — D862 Sarcoidosis of lung with sarcoidosis of lymph nodes: Secondary | ICD-10-CM

## 2022-08-20 DIAGNOSIS — G4733 Obstructive sleep apnea (adult) (pediatric): Secondary | ICD-10-CM | POA: Diagnosis not present

## 2022-08-20 DIAGNOSIS — J45998 Other asthma: Secondary | ICD-10-CM | POA: Diagnosis not present

## 2022-08-20 DIAGNOSIS — J302 Other seasonal allergic rhinitis: Secondary | ICD-10-CM | POA: Diagnosis not present

## 2022-08-20 MED ORDER — ALBUTEROL SULFATE HFA 108 (90 BASE) MCG/ACT IN AERS: 2.0000 | INHALATION_SPRAY | Freq: Four times a day (QID) | RESPIRATORY_TRACT | 3 refills | Status: DC | PRN

## 2022-08-20 MED ORDER — SYMBICORT 160-4.5 MCG/ACT IN AERO: INHALATION_SPRAY | RESPIRATORY_TRACT | 3 refills | Status: DC

## 2022-08-20 NOTE — Assessment & Plan Note (Signed)
Excellent control compliance.  Continue on current settings.  Plan Patient Instructions  Continue on CPAP At bedtime  , try to wear each night for >6hr .  Work on healthy weight Do not drive if sleepy Saline nasal rinses As needed   Saline nasal gel At bedtime   Zyrtec 34m At bedtime as needed.   Continue on Symbicort 2 puffs twice daily. Rinse after use.  Albuterol inhaler As needed  .  Follow-up with Dr. AElsworth Sohoor Cartel Mauss NP in 6 months and As needed   Please contact office for sooner follow up if symptoms do not improve or worsen or seek emergency care

## 2022-08-20 NOTE — Patient Instructions (Addendum)
Continue on CPAP At bedtime  , try to wear each night for >6hr .  Work on healthy weight Do not drive if sleepy Saline nasal rinses As needed   Saline nasal gel At bedtime   Zyrtec 69m At bedtime as needed.   Continue on Symbicort 2 puffs twice daily. Rinse after use.  Albuterol inhaler As needed  .  Follow-up with Dr. AElsworth Sohoor Marilouise Densmore NP in 6 months and As needed   Please contact office for sooner follow up if symptoms do not improve or worsen or seek emergency care

## 2022-08-20 NOTE — Assessment & Plan Note (Signed)
Well-controlled continue on current regimen

## 2022-08-20 NOTE — Assessment & Plan Note (Signed)
Excellent control continue on current maintenance regimen asthma action plan discussed  Plan  Patient Instructions  Continue on CPAP At bedtime  , try to wear each night for >6hr .  Work on healthy weight Do not drive if sleepy Saline nasal rinses As needed   Saline nasal gel At bedtime   Zyrtec 68m At bedtime as needed.   Continue on Symbicort 2 puffs twice daily. Rinse after use.  Albuterol inhaler As needed  .  Follow-up with Dr. AElsworth Sohoor Charyl Minervini NP in 6 months and As needed   Please contact office for sooner follow up if symptoms do not improve or worsen or seek emergency care

## 2022-08-20 NOTE — Assessment & Plan Note (Signed)
Does not appear to be having any active symptoms.  Continue on current regimen

## 2022-08-20 NOTE — Progress Notes (Signed)
$@PatientB$  ID: Lindsey Copeland, female    DOB: 07-Nov-1960, 62 y.o.   MRN: [id number]  Chief Complaint  Patient presents with   Follow-up    Referring provider: Lenetta Quaker  HPI: 62 year old female former smoker followed for sarcoidosis, asthma and moderate obstructive sleep apnea Medical history significant for Graves' disease status post radioactive iodine, esophageal dysmotility with previous esophageal dilation, diabetes and migraines  TEST/EVENTS :  Esophagram  12/2017 >>  Dysmotility & stasis >> referred to GI   01/2013 PSG -wt 190 pounds, BMI 34 showed predominant hypopneas during supine sleep and with AHI of 16 events per hour. Nonsupine AHI was only 3 events per hour.     PFTs 07/2012 nml lung volumes, no obs, DLCO 71% PFT 10/2014 >> ratio 80, FVC 104%, FEV1 108%, DLCO 83%-normal 06/2017 spiro >> nml, FVC 92% Spirometry November 28, 2021 showed normal lung function with FEV1 at 149%, ratio 94      CT chest 05/2013 Mediastinal/bilateral hilar lymphadenopathy, Peribronchovascular nodularity throughout all lobes, numerous lesions in the spleen.   HRCT 08/2017 >> decreased lymphadenopathy, mild 1 to 2 mm nodules along fissures consistent with sarcoidosis   Echo 09/2017 grade 1 DD   CXR 32020 - clear.,  Oct 28, 2021 chest x-ray is clear   CT coronaries 06/2020 no significant lung disease  08/20/2022 Follow up : Sarcoid, Asthma, OSA  Patient returns for a 90-monthfollow-up.  Says overall she has been doing well with her breathing.  She has underlying sarcoid and asthma.  Remains on Symbicort twice daily.  Patient says she has had no flare of cough or wheezing.  No increased albuterol use.  Says she has been having some increased nasal drainage since walking outside recently.  She does not Zyrtec but has not started it yet.  Patient does have sleep apnea is on nocturnal CPAP.  Patient says she wears her CPAP every single night.  Cannot sleep without it.  Feels rested and that she  benefits from CPAP with decreased daytime sleepiness. CPAP download shows excellent compliance with 90% usage.  Daily average usage is 6 hours.  Patient is on auto CPAP 8 to 12 cm H2O.  AHI is 1.0/hour.  Allergies  Allergen Reactions   Sulfa Antibiotics Hives    hives   Indomethacin Palpitations   Robaxin [Methocarbamol] Palpitations    Immunization History  Administered Date(s) Administered   Influenza Split 04/08/2013, 03/30/2014, 04/16/2016, 03/28/2017, 04/19/2019, 03/29/2020, 04/30/2021   Influenza Whole 04/30/2012   Influenza,inj,Quad PF,6+ Mos 03/27/2015, 04/18/2016, 03/23/2017, 05/19/2018, 03/15/2019, 02/28/2021   Influenza-Unspecified 03/30/2014   PFIZER Comirnaty(Gray Top)Covid-19 Tri-Sucrose Vaccine 10/16/2020   PFIZER(Purple Top)SARS-COV-2 Vaccination 07/30/2019, 08/28/2019, 04/20/2020   Pfizer Covid-19 Vaccine Bivalent Booster 1101yr& up 05/28/2021   Pneumococcal Polysaccharide-23 08/21/2017   Td 12/15/2002   Tdap 12/16/2011    Past Medical History:  Diagnosis Date   Arthritis    Asthma    Carpal tunnel syndrome    COPD (chronic obstructive pulmonary disease) (HCC)    sarcoidosis   Dysrhythmia    palpitations   GERD (gastroesophageal reflux disease)    Graves disease    Hyperlipidemia    Hyperthyroidism    Leaky heart valve    Lumbar spinal stenosis    Sarcoidosis    Sleep apnea    CPAP   Type 2 diabetes mellitus ([place]   Vitamin D deficiency     Tobacco History: Social History   Tobacco Use  Smoking Status Former   Packs/day:  0.10   Years: 10.00   Total pack years: 1.00   Types: Cigarettes   Quit date: 06/30/2002   Years since quitting: 20.1  Smokeless Tobacco Never   Counseling given: Not Answered   Outpatient Medications Prior to Visit  Medication Sig Dispense Refill   albuterol (PROVENTIL) (2.5 MG/3ML) 0.083% nebulizer solution Take 3 mLs (2.5 mg total) by nebulization every 6 (six) hours as needed for wheezing or shortness of breath  (every 6 hours AND as needed for SOB). 75 mL 6   aspirin 81 MG tablet Take 81 mg by mouth daily.     brimonidine (ALPHAGAN) 0.15 % ophthalmic solution      Calcium Carbonate Antacid (TUMS E-X 750 PO) Take by mouth as needed.     Cholecalciferol (VITAMIN D-3) 1000 UNITS CAPS Take 1 capsule by mouth daily.      fluticasone (FLONASE) 50 MCG/ACT nasal spray Place 2 sprays into both nostrils daily as needed for allergies or rhinitis. 48 g 3   gabapentin (NEURONTIN) 100 MG capsule Take 100 mg by mouth at bedtime.     ibuprofen (ADVIL,MOTRIN) 800 MG tablet Take 800 mg by mouth every 8 (eight) hours as needed.     ketoconazole (NIZORAL) 2 % cream Apply 1 application topically 2 (two) times daily.     latanoprost (XALATAN) 0.005 % ophthalmic solution 1 drop at bedtime.     Lifitegrast (XIIDRA) 5 % SOLN Place 1 drop into both eyes 2 times daily.     metFORMIN (GLUCOPHAGE) 500 MG tablet Take 1,000 mg by mouth 2 (two) times daily with a meal.     Multiple Vitamin (MULTIVITAMIN) tablet Take 1 tablet by mouth daily.     omeprazole (PRILOSEC) 20 MG capsule Take 20 mg by mouth daily as needed (indigestion).      pravastatin (PRAVACHOL) 80 MG tablet Take 80 mg by mouth daily.     SUMAtriptan (IMITREX) 50 MG tablet Take 1 tablet (50 mg total) by mouth every 2 (two) hours as needed for migraine. May repeat in 2 hours if headache persists or recurs. 10 tablet 6   venlafaxine XR (EFFEXOR-XR) 75 MG 24 hr capsule Take 1 capsule (75 mg total) by mouth daily. 90 capsule 4   PROAIR HFA 108 (90 Base) MCG/ACT inhaler USE 2 INHALATIONS EVERY 6 HOURS AS NEEDED FOR WHEEZING OR SHORTNESS OF BREATH 8.5 g 13   SYMBICORT 160-4.5 MCG/ACT inhaler INHALE 2 PUFFS BY MOUTH TWICE DAILY 30.6 g 3   No facility-administered medications prior to visit.     Review of Systems:   Constitutional:   No  weight loss, night sweats,  Fevers, chills, fatigue, or  lassitude.  HEENT:   No headaches,  Difficulty swallowing,  Tooth/dental  problems, or  Sore throat,                No sneezing, itching, ear ache,  +nasal congestion, post nasal drip,   CV:  No chest pain,  Orthopnea, PND, swelling in lower extremities, anasarca, dizziness, palpitations, syncope.   GI  No heartburn, indigestion, abdominal pain, nausea, vomiting, diarrhea, change in bowel habits, loss of appetite, bloody stools.   Resp: No shortness of breath with exertion or at rest.  No excess mucus, no productive cough,  No non-productive cough,  No coughing up of blood.  No change in color of mucus.  No wheezing.  No chest wall deformity  Skin: no rash or lesions.  GU: no dysuria, change in color of urine, no  urgency or frequency.  No flank pain, no hematuria   MS:  No joint pain or swelling.  No decreased range of motion.  No back pain.    Physical Exam  BP 108/60 (BP Location: Left Arm, Patient Position: Sitting, Cuff Size: Normal)   Pulse 95   Temp 98.2 F (36.8 C) (Oral)   Ht 5' (1.524 m)   Wt 194 lb 12.8 oz (88.4 kg)   SpO2 99%   BMI 38.04 kg/m   GEN: A/Ox3; pleasant , NAD, well nourished    HEENT:  /AT,  , NOSE-clear, THROAT-clear, no lesions, no postnasal drip or exudate noted.   NECK:  Supple w/ fair ROM; no JVD; normal carotid impulses w/o bruits; no thyromegaly or nodules palpated; no lymphadenopathy.    RESP  Clear  P & A; w/o, wheezes/ rales/ or rhonchi. no accessory muscle use, no dullness to percussion  CARD:  RRR, no m/r/g, no peripheral edema, pulses intact, no cyanosis or clubbing.  GI:   Soft & nt; nml bowel sounds; no organomegaly or masses detected.   Musco: Warm bil, no deformities or joint swelling noted.   Neuro: alert, no focal deficits noted.    Skin: Warm, no lesions or rashes    Lab Results:  CBC    Component Value Date/Time   WBC 11.6 (H) 04/10/2021 1618   RBC 4.43 04/10/2021 1618   HGB 12.1 04/10/2021 1618   HCT 38.0 04/10/2021 1618   PLT 227.0 04/10/2021 1618   MCV 85.9 04/10/2021 1618   MCHC  31.9 04/10/2021 1618   RDW 14.7 04/10/2021 1618   LYMPHSABS 3.5 04/10/2021 1618   MONOABS 0.7 04/10/2021 1618   EOSABS 0.2 04/10/2021 1618   BASOSABS 0.1 04/10/2021 1618    BMET    Component Value Date/Time   NA 143 07/25/2020 1112   K 4.4 07/25/2020 1112   CL 105 07/25/2020 1112   CO2 23 07/25/2020 1112   GLUCOSE 87 07/25/2020 1112   GLUCOSE 93 10/09/2015 0730   BUN 17 07/25/2020 1112   CREATININE 0.80 07/25/2020 1112   CALCIUM 9.2 07/25/2020 1112   GFRNONAA 80 07/25/2020 1112   GFRAA 92 07/25/2020 1112    BNP No results found for: "BNP"  ProBNP No results found for: "PROBNP"  Imaging: No results found.       Latest Ref Rng & Units 10/31/2014   11:03 AM  PFT Results  FVC-Pre L 2.61   FVC-Predicted Pre % 104   FVC-Post L 2.60   FVC-Predicted Post % 104   Pre FEV1/FVC % % 83   Post FEV1/FCV % % 85   FEV1-Pre L 2.16   FEV1-Predicted Pre % 108   FEV1-Post L 2.22   DLCO uncorrected ml/min/mmHg 17.49   DLCO UNC% % 83   DLVA Predicted % 106   TLC L 3.87   TLC % Predicted % 82   RV % Predicted % 68     No results found for: "NITRICOXIDE"      Assessment & Plan:   Asthma, persistent controlled Excellent control continue on current maintenance regimen asthma action plan discussed  Plan  Patient Instructions  Continue on CPAP At bedtime  , try to wear each night for >6hr .  Work on healthy weight Do not drive if sleepy Saline nasal rinses As needed   Saline nasal gel At bedtime   Zyrtec 47m At bedtime as needed.   Continue on Symbicort 2 puffs twice daily. Rinse after use.  Albuterol inhaler As needed  .  Follow-up with Dr. Elsworth Soho or Christena Sunderlin NP in 6 months and As needed   Please contact office for sooner follow up if symptoms do not improve or worsen or seek emergency care     Allergic rhinitis Well-controlled continue on current regimen  OSA (obstructive sleep apnea) Excellent control compliance.  Continue on current settings.  Plan Patient  Instructions  Continue on CPAP At bedtime  , try to wear each night for >6hr .  Work on healthy weight Do not drive if sleepy Saline nasal rinses As needed   Saline nasal gel At bedtime   Zyrtec 28m At bedtime as needed.   Continue on Symbicort 2 puffs twice daily. Rinse after use.  Albuterol inhaler As needed  .  Follow-up with Dr. 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  Continue on current regimen     TRexene Edison NP 08/20/2022

## 2022-09-11 ENCOUNTER — Ambulatory Visit: Payer: Medicare Other | Admitting: Physician Assistant

## 2022-09-23 ENCOUNTER — Encounter: Payer: Self-pay | Admitting: Physician Assistant

## 2022-09-23 ENCOUNTER — Ambulatory Visit: Payer: Medicare Other | Attending: Physician Assistant | Admitting: Physician Assistant

## 2022-09-23 VITALS — BP 120/70 | HR 62 | Ht 60.0 in | Wt 190.6 lb

## 2022-09-23 DIAGNOSIS — I5032 Chronic diastolic (congestive) heart failure: Secondary | ICD-10-CM | POA: Diagnosis present

## 2022-09-23 DIAGNOSIS — E782 Mixed hyperlipidemia: Secondary | ICD-10-CM | POA: Insufficient documentation

## 2022-09-23 DIAGNOSIS — R0609 Other forms of dyspnea: Secondary | ICD-10-CM | POA: Diagnosis not present

## 2022-09-23 DIAGNOSIS — Z7189 Other specified counseling: Secondary | ICD-10-CM | POA: Insufficient documentation

## 2022-09-23 DIAGNOSIS — R079 Chest pain, unspecified: Secondary | ICD-10-CM | POA: Diagnosis not present

## 2022-09-23 NOTE — Progress Notes (Signed)
Office Visit    Patient Name: Lindsey Copeland Date of Encounter: 09/23/2022  PCP:  Marda Stalker, [place]  Cardiologist:  Fransico Him, MD  Advanced Practice Provider:  No care team member to display Electrophysiologist:  None   HPI    Camala Bonifas is a 62 y.o. female with past medical history significant for OSA on CPAP, sarcoidosis, GERD, hyperlipidemia, Graves' disease, remote tobacco use, chest pain presents today for annual follow-up visit.  She was seen by Dr. Radford Pax 07/11/2020 for evaluation of chest pain and shortness of breath.  Additionally, she had multiple risk factors for CAD including diabetes, dyslipidemia, and remote history of tobacco use.  Nuclear stress test in 2019 showed no ischemia.  She described chest pain as pinching that it worsened since COVID infection 06/24/2020.  Reported chronic DOE from COPD and sarcoidosis which is followed by pulmonary.  Previous history of chest pain improved with changing jobs and having a course and stress management from the [place].  Had coronary CT which revealed calcium score of 0.  Echocardiogram revealed normal LVEF 60 to 65%, grade 1 DD, trivial MR.  She was seen by Stephan Minister, NP 08/2021 and at that time she was feeling well without any chest pain or shortness of breath.  She denied dizziness, lightheadedness, presyncope, syncope, edema, orthopnea, PND.  Was walking 30 minutes 3 to 4 days a week without concerns for activity intolerance.  Felt that her breathing had improved over the last couple of months and did not have any specific cardiac concerns at that time.  Today, she tells me that she has some stress due to sickness in her family.  Her husband was diagnosed with a brain tumor but they were told it is not cancerous.  She does have some chest pain from time to time and had about over 2 months ago.  She states that it did not last long and was at a time with extreme emotional distress.   She still enjoys walking and now that the weather is getting nicer has been walking a few days a week.  Sometimes she does get fatigued after a bout of chest pain but it usually associated with anxiety. We discussed repeating an echo next year.  Reports no shortness of breath nor dyspnea on exertion. No edema, orthopnea, PND. Reports no palpitations.   Past Medical History    Past Medical History:  Diagnosis Date   Arthritis    Asthma    Carpal tunnel syndrome    COPD (chronic obstructive pulmonary disease) (HCC)    sarcoidosis   Dysrhythmia    palpitations   GERD (gastroesophageal reflux disease)    Graves disease    Hyperlipidemia    Hyperthyroidism    Leaky heart valve    Lumbar spinal stenosis    Sarcoidosis    Sleep apnea    CPAP   Type 2 diabetes mellitus ([place])    Vitamin D deficiency    Past Surgical History:  Procedure Laterality Date   CARPAL TUNNEL RELEASE Right 02/20/2015   Procedure: RIGHT CARPAL TUNNEL RELEASE;  Surgeon: Daryll Brod, MD;  Location: [facility];  Service: Orthopedics;  Laterality: Right;  ANESTHESIA:  [facility]   CARPAL TUNNEL RELEASE Left 10/09/2015   Procedure: LEFT CARPAL TUNNEL RELEASE;  Surgeon: Daryll Brod, MD;  Location: [facility];  Service: Orthopedics;  Laterality: Left;   radioactive iodine treatment     right knee  arthroscopy     SHOULDER ARTHROSCOPY     TUBAL LIGATION     ULNAR NERVE REPAIR     VIDEO BRONCHOSCOPY  07/30/2012   Procedure: VIDEO BRONCHOSCOPY WITH FLUORO;  Surgeon: Elsie Stain, MD;  Location: [facility];  Service: Cardiopulmonary;  Laterality: N/A;    Allergies  Allergies  Allergen Reactions   Sulfa Antibiotics Hives    hives   Indomethacin Palpitations   Robaxin [Methocarbamol] Palpitations    EKGs/Labs/Other Studies Reviewed:   The following studies were reviewed today: Echo 07/2020   Left Ventricle: Left ventricular ejection fraction, by estimation, is 60  to 65%.  The left ventricle has normal function. The left ventricle has no  regional wall motion abnormalities. The average left ventricular global  longitudinal strain is -22.5 %.  The global longitudinal strain is normal. The left ventricular internal  cavity size was normal in size. There is no left ventricular hypertrophy.  Left ventricular diastolic parameters are consistent with Grade I  diastolic dysfunction (impaired  relaxation). Indeterminate filling pressures.  Right Ventricle: The right ventricular size is normal. No increase in  right ventricular wall thickness. Right ventricular systolic function is  normal. There is normal pulmonary artery systolic pressure. The tricuspid  regurgitant velocity is 2.81 m/s, and   with an assumed right atrial pressure of 3 mmHg, the estimated right  ventricular systolic pressure is [id number] mmHg.  Left Atrium: Left atrial size was normal in size.  Right Atrium: Right atrial size was normal in size.  Pericardium: There is no evidence of pericardial effusion.  Mitral Valve: The mitral valve is grossly normal. Trivial mitral valve  regurgitation.  Tricuspid Valve: The tricuspid valve is grossly normal. Tricuspid valve  regurgitation is trivial.  Aortic Valve: The aortic valve is tricuspid. Aortic valve regurgitation is  not visualized.  Pulmonic Valve: The pulmonic valve was normal in structure. Pulmonic valve  regurgitation is trivial.  Aorta: The aortic root and ascending aorta are structurally normal, with  no evidence of dilitation.  Venous: The inferior vena cava is normal in size with greater than 50%  respiratory variability, suggesting right atrial pressure of 3 mmHg.  IAS/Shunts: No atrial level shunt detected by color flow Doppler.      Cor CT 07/30/20   1. Coronary calcium score of 0. This was 0 percentile for age and sex matched control.   2.  Normal coronary origin with right dominance.   3.  No evidence of CAD.  CAD RADS 0.   4.   Consider non atherosclerotic causes of chest pain.    EKG:  EKG is  ordered today.  The ekg ordered today demonstrates NSR, rate 63 bpm.  Recent Labs: No results found for requested labs within last 365 days.  Recent Lipid Panel No results found for: "CHOL", "TRIG", "HDL", "CHOLHDL", "VLDL", "LDLCALC", "LDLDIRECT"  Home Medications   Current Meds  Medication Sig   albuterol (PROAIR HFA) 108 (90 Base) MCG/ACT inhaler Inhale 2 puffs into the lungs every 6 (six) hours as needed for wheezing or shortness of breath. Fill insurance preference   albuterol (PROVENTIL) (2.5 MG/3ML) 0.083% nebulizer solution Take 3 mLs (2.5 mg total) by nebulization every 6 (six) hours as needed for wheezing or shortness of breath (every 6 hours AND as needed for SOB).   aspirin 81 MG tablet Take 81 mg by mouth daily.   Blood Glucose Monitoring Suppl (FREESTYLE FREEDOM LITE) w/Device KIT as directed finger stick once a day (DX e11.69  brimonidine (ALPHAGAN) 0.15 % ophthalmic solution    Calcium Carbonate Antacid (TUMS E-X 750 PO) Take by mouth as needed.   Cholecalciferol (VITAMIN D-3) 1000 UNITS CAPS Take 1 capsule by mouth daily.    diphenhydrAMINE (BENADRYL) 25 MG tablet as needed.   fexofenadine (ALLEGRA) 180 MG tablet Take by mouth as needed.   fluticasone (FLONASE) 50 MCG/ACT nasal spray Place 2 sprays into both nostrils daily as needed for allergies or rhinitis.   gabapentin (NEURONTIN) 100 MG capsule Take 100 mg by mouth at bedtime.   ibuprofen (ADVIL,MOTRIN) 800 MG tablet Take 800 mg by mouth every 8 (eight) hours as needed.   ketoconazole (NIZORAL) 2 % cream Apply 1 application topically 2 (two) times daily.   Lancets (FREESTYLE) lancets as directed finger stick once a dayDX E11.69)   Lancets (FREESTYLE) lancets as directed finger stick once a day prn for 90 days   latanoprost (XALATAN) 0.005 % ophthalmic solution 1 drop at bedtime.   Lifitegrast (XIIDRA) 5 % SOLN Place 1 drop into both eyes 2 times  daily.   metFORMIN (GLUCOPHAGE) 500 MG tablet Take 1,000 mg by mouth 2 (two) times daily with a meal.   Multiple Vitamin (MULTIVITAMIN) tablet Take 1 tablet by mouth daily.   omeprazole (PRILOSEC) 20 MG capsule Take 20 mg by mouth daily as needed (indigestion).    pravastatin (PRAVACHOL) 80 MG tablet Take 80 mg by mouth daily.   SUMAtriptan (IMITREX) 50 MG tablet Take 1 tablet (50 mg total) by mouth every 2 (two) hours as needed for migraine. May repeat in 2 hours if headache persists or recurs.   SYMBICORT 160-4.5 MCG/ACT inhaler INHALE 2 PUFFS BY MOUTH TWICE DAILY   tiZANidine (ZANAFLEX) 4 MG capsule Take by mouth as needed.   [DISCONTINUED] venlafaxine XR (EFFEXOR-XR) 75 MG 24 hr capsule Take 1 capsule (75 mg total) by mouth daily.     Review of Systems      All other systems reviewed and are otherwise negative except as noted above.  Physical Exam    VS:  BP 120/70   Pulse 62   Ht 5' (1.524 m)   Wt 190 lb 9.6 oz (86.5 kg)   SpO2 96%   BMI 37.22 kg/m  , BMI Body mass index is 37.22 kg/m.  Wt Readings from Last 3 Encounters:  09/23/22 190 lb 9.6 oz (86.5 kg)  08/20/22 194 lb 12.8 oz (88.4 kg)  02/17/22 188 lb 3.2 oz (85.4 kg)     GEN: Well nourished, well developed, in no acute distress. HEENT: normal. Neck: Supple, no JVD, carotid bruits, or masses. Cardiac: RRR, no murmurs, rubs, or gallops. No clubbing, cyanosis, edema.  Radials/PT 2+ and equal bilaterally.  Respiratory:  Respirations regular and unlabored, clear to auscultation bilaterally. GI: Soft, nontender, nondistended. MS: No deformity or atrophy. Skin: Warm and dry, no rash. Neuro:  Strength and sensation are intact. Psych: Normal affect.  Assessment & Plan    Chest pain -This is chronic for her and occurred 1 to over 2 months ago during a time of extreme stress -She remains on aspirin and pravastatin without issue -If her pain becomes more frequent or lasts for longer amounts of time she is to reach out  to [place]  Chronic HFpEF -no recent edema -no SOB -She continues to exercise by walking several days a week  Mild valvular dysfunction -No murmur heard on exam today -Last echo was 2 years ago with mild diastolic dysfunction and trivial mitral regurgitation -Plan  to repeat echocardiogram next year  Hyperlipidemia LDL goal less than 100 -fax [place] a copy of labs next month -last LDL was slightly above goal, if remains high will consider adding zetia to her statin         Disposition: Follow up 1 year with Fransico Him, MD or APP.  Signed, Elgie Collard, PA-C 09/23/2022, 3:59 PM [facility]

## 2022-09-23 NOTE — Patient Instructions (Signed)
Medication Instructions:  Your physician recommends that you continue on your current medications as directed. Please refer to the Current Medication list given to you today.  *If you need a refill on your cardiac medications before your next appointment, please call your pharmacy*  Lab Work: Have your primary care provider fax [place] a copy of your labs when you have them drawn next month [phone] If you have labs (blood work) drawn today and your tests are completely normal, you will receive your results only by: [place] (if you have MyChart) OR A paper copy in the mail If you have any lab test that is abnormal or we need to change your treatment, we will call you to review the results.  Follow-Up: At [facility], you and your health needs are our priority.  As part of our continuing mission to provide you with exceptional heart care, we have created designated Provider Care Teams.  These Care Teams include your primary Cardiologist (physician) and Advanced Practice Providers (APPs -  Physician Assistants and Nurse Practitioners) who all work together to provide you with the care you need, when you need it.  We recommend signing up for the patient portal called "MyChart".  Sign up information is provided on this After Visit Summary.  MyChart is used to connect with patients for Virtual Visits (Telemedicine).  Patients are able to view lab/test results, encounter notes, upcoming appointments, etc.  Non-urgent messages can be sent to your provider as well.   To learn more about what you can do with MyChart, go to [email or web address].    Your next appointment:   1 year(s)  Provider:   Fransico Him, MD   Other Instructions Low-Sodium Eating Plan Sodium, which is an element that makes up salt, helps you maintain a healthy balance of fluids in your body. Too much sodium can increase your blood pressure and cause fluid and waste to be held in your body. Your health care  provider or dietitian may recommend following this plan if you have high blood pressure (hypertension), kidney disease, liver disease, or heart failure. Eating less sodium can help lower your blood pressure, reduce swelling, and protect your heart, liver, and kidneys. What are tips for following this plan? Reading food labels The Nutrition Facts label lists the amount of sodium in one serving of the food. If you eat more than one serving, you must multiply the listed amount of sodium by the number of servings. Choose foods with less than 140 mg of sodium per serving. Avoid foods with 300 mg of sodium or more per serving. Shopping  Look for lower-sodium products, often labeled as "low-sodium" or "no salt added." Always check the sodium content, even if foods are labeled as "unsalted" or "no salt added." Buy fresh foods. Avoid canned foods and pre-made or frozen meals. Avoid canned, cured, or processed meats. Buy breads that have less than 80 mg of sodium per slice. Cooking  Eat more home-cooked food and less restaurant, buffet, and fast food. Avoid adding salt when cooking. Use salt-free seasonings or herbs instead of table salt or sea salt. Check with your health care provider or pharmacist before using salt substitutes. Cook with plant-based oils, such as canola, sunflower, or olive oil. Meal planning When eating at a restaurant, ask that your food be prepared with less salt or no salt, if possible. Avoid dishes labeled as brined, pickled, cured, smoked, or made with soy sauce, miso, or teriyaki sauce. Avoid foods that contain MSG (monosodium  glutamate). MSG is sometimes added to [place] food, bouillon, and some canned foods. Make meals that can be grilled, baked, poached, roasted, or steamed. These are generally made with less sodium. General information Most people on this plan should limit their sodium intake to 1,500-2,000 mg (milligrams) of sodium each day. What foods should I  eat? Fruits Fresh, frozen, or canned fruit. Fruit juice. Vegetables Fresh or frozen vegetables. "No salt added" canned vegetables. "No salt added" tomato sauce and paste. Low-sodium or reduced-sodium tomato and vegetable juice. Grains Low-sodium cereals, including oats, puffed wheat and rice, and shredded wheat. Low-sodium crackers. Unsalted rice. Unsalted pasta. Low-sodium bread. Whole-grain breads and whole-grain pasta. Meats and other proteins Fresh or frozen (no salt added) meat, poultry, seafood, and fish. Low-sodium canned tuna and salmon. Unsalted nuts. Dried peas, beans, and lentils without added salt. Unsalted canned beans. Eggs. Unsalted nut butters. Dairy Milk. Soy milk. Cheese that is naturally low in sodium, such as ricotta cheese, fresh mozzarella, or Swiss cheese. Low-sodium or reduced-sodium cheese. Cream cheese. Yogurt. Seasonings and condiments Fresh and dried herbs and spices. Salt-free seasonings. Low-sodium mustard and ketchup. Sodium-free salad dressing. Sodium-free light mayonnaise. Fresh or refrigerated horseradish. Lemon juice. Vinegar. Other foods Homemade, reduced-sodium, or low-sodium soups. Unsalted popcorn and pretzels. Low-salt or salt-free chips. The items listed above may not be a complete list of foods and beverages you can eat. Contact a dietitian for more information. What foods should I avoid? Vegetables Sauerkraut, pickled vegetables, and relishes. Olives. [place] fries. Onion rings. Regular canned vegetables (not low-sodium or reduced-sodium). Regular canned tomato sauce and paste (not low-sodium or reduced-sodium). Regular tomato and vegetable juice (not low-sodium or reduced-sodium). Frozen vegetables in sauces. Grains Instant hot cereals. Bread stuffing, pancake, and biscuit mixes. Croutons. Seasoned rice or pasta mixes. Noodle soup cups. Boxed or frozen macaroni and cheese. Regular salted crackers. Self-rising flour. Meats and other proteins Meat or  fish that is salted, canned, smoked, spiced, or pickled. Precooked or cured meat, such as sausages or meat loaves. [place] Salines. Ham. Pepperoni. Hot dogs. Corned beef. Chipped beef. Salt pork. Jerky. Pickled herring. Anchovies and sardines. Regular canned tuna. Salted nuts. Dairy Processed cheese and cheese spreads. Hard cheeses. Cheese curds. Blue cheese. Feta cheese. String cheese. Regular cottage cheese. Buttermilk. Canned milk. Fats and oils Salted butter. Regular margarine. Ghee. Bacon fat. Seasonings and condiments Onion salt, garlic salt, seasoned salt, table salt, and sea salt. Canned and packaged gravies. Worcestershire sauce. Tartar sauce. Barbecue sauce. Teriyaki sauce. Soy sauce, including reduced-sodium. Steak sauce. Fish sauce. Oyster sauce. Cocktail sauce. Horseradish that you find on the shelf. Regular ketchup and mustard. Meat flavorings and tenderizers. Bouillon cubes. Hot sauce. Pre-made or packaged marinades. Pre-made or packaged taco seasonings. Relishes. Regular salad dressings. Salsa. Other foods Salted popcorn and pretzels. Corn chips and puffs. Potato and tortilla chips. Canned or dried soups. Pizza. Frozen entrees and pot pies. The items listed above may not be a complete list of foods and beverages you should avoid. Contact a dietitian for more information. Summary Eating less sodium can help lower your blood pressure, reduce swelling, and protect your heart, liver, and kidneys. Most people on this plan should limit their sodium intake to 1,500-2,000 mg (milligrams) of sodium each day. Canned, boxed, and frozen foods are high in sodium. Restaurant foods, fast foods, and pizza are also very high in sodium. You also get sodium by adding salt to food. Try to cook at home, eat more fresh fruits and vegetables, and eat less fast  food and canned, processed, or prepared foods. This information is not intended to replace advice given to you by your health care provider. Make sure you  discuss any questions you have with your health care provider. Document Revised: 05/23/2019 Document Reviewed: 05/18/2019 Elsevier Patient Education  [place] Many factors influence your heart health, including eating and exercise habits. Heart health is also called coronary health. Coronary risk increases with abnormal blood fat (lipid) levels. A heart-healthy eating plan includes limiting unhealthy fats, increasing healthy fats, limiting salt (sodium) intake, and making other diet and lifestyle changes. What is my plan? Your health care provider may recommend that: You limit your fat intake to _________% or less of your total calories each day. You limit your saturated fat intake to _________% or less of your total calories each day. You limit the amount of cholesterol in your diet to less than _________ mg per day. You limit the amount of sodium in your diet to less than _________ mg per day. What are tips for following this plan? Cooking Cook foods using methods other than frying. Baking, boiling, grilling, and broiling are all good options. Other ways to reduce fat include: Removing the skin from poultry. Removing all visible fats from meats. Steaming vegetables in water or broth. Meal planning  At meals, imagine dividing your plate into fourths: Fill one-half of your plate with vegetables and green salads. Fill one-fourth of your plate with whole grains. Fill one-fourth of your plate with lean protein foods. Eat 2-4 cups of vegetables per day. One cup of vegetables equals 1 cup (91 g) broccoli or cauliflower florets, 2 medium carrots, 1 large bell pepper, 1 large sweet potato, 1 large tomato, 1 medium white potato, 2 cups (150 g) raw leafy greens. Eat 1-2 cups of fruit per day. One cup of fruit equals 1 small apple, 1 large banana, 1 cup (237 g) mixed fruit, 1 large orange,  cup (82 g) dried fruit, 1 cup (240 mL) 100% fruit juice. Eat more  foods that contain soluble fiber. Examples include apples, broccoli, carrots, beans, peas, and barley. Aim to get 25-30 g of fiber per day. Increase your consumption of legumes, nuts, and seeds to 4-5 servings per week. One serving of dried beans or legumes equals  cup (90 g) cooked, 1 serving of nuts is  oz (12 almonds, 24 pistachios, or 7 walnut halves), and 1 serving of seeds equals  oz (8 g). Fats Choose healthy fats more often. Choose monounsaturated and polyunsaturated fats, such as olive and canola oils, avocado oil, flaxseeds, walnuts, almonds, and seeds. Eat more omega-3 fats. Choose salmon, mackerel, sardines, tuna, flaxseed oil, and ground flaxseeds. Aim to eat fish at least 2 times each week. Check food labels carefully to identify foods with trans fats or high amounts of saturated fat. Limit saturated fats. These are found in animal products, such as meats, butter, and cream. Plant sources of saturated fats include palm oil, palm kernel oil, and coconut oil. Avoid foods with partially hydrogenated oils in them. These contain trans fats. Examples are stick margarine, some tub margarines, cookies, crackers, and other baked goods. Avoid fried foods. General information Eat more home-cooked food and less restaurant, buffet, and fast food. Limit or avoid alcohol. Limit foods that are high in added sugar and simple starches such as foods made using white refined flour (white breads, pastries, sweets). Lose weight if you are overweight. Losing just 5-10% of your body weight can help  your overall health and prevent diseases such as diabetes and heart disease. Monitor your sodium intake, especially if you have high blood pressure. Talk with your health care provider about your sodium intake. Try to incorporate more vegetarian meals weekly. What foods should I eat? Fruits All fresh, canned (in natural juice), or frozen fruits. Vegetables Fresh or frozen vegetables (raw, steamed, roasted, or  grilled). Green salads. Grains Most grains. Choose whole wheat and whole grains most of the time. Rice and pasta, including brown rice and pastas made with whole wheat. Meats and other proteins Lean, well-trimmed beef, veal, pork, and lamb. Chicken and [place] without skin. All fish and shellfish. Wild duck, rabbit, pheasant, and venison. Egg whites or low-cholesterol egg substitutes. Dried beans, peas, lentils, and tofu. Seeds and most nuts. Dairy Low-fat or nonfat cheeses, including ricotta and mozzarella. Skim or 1% milk (liquid, powdered, or evaporated). Buttermilk made with low-fat milk. Nonfat or low-fat yogurt. Fats and oils Non-hydrogenated (trans-free) margarines. Vegetable oils, including soybean, sesame, sunflower, olive, avocado, peanut, safflower, corn, canola, and cottonseed. Salad dressings or mayonnaise made with a vegetable oil. Beverages Water (mineral or sparkling). Coffee and tea. Unsweetened ice tea. Diet beverages. Sweets and desserts Sherbet, gelatin, and fruit ice. Small amounts of dark chocolate. Limit all sweets and desserts. Seasonings and condiments All seasonings and condiments. The items listed above may not be a complete list of foods and beverages you can eat. Contact a dietitian for more options. What foods should I avoid? Fruits Canned fruit in heavy syrup. Fruit in cream or butter sauce. Fried fruit. Limit coconut. Vegetables Vegetables cooked in cheese, cream, or butter sauce. Fried vegetables. Grains Breads made with saturated or trans fats, oils, or whole milk. Croissants. Sweet rolls. Donuts. High-fat crackers, such as cheese crackers and chips. Meats and other proteins Fatty meats, such as hot dogs, ribs, sausage, bacon, rib-eye roast or steak. High-fat deli meats, such as salami and bologna. Caviar. Domestic duck and goose. Organ meats, such as liver. Dairy Cream, sour cream, cream cheese, and creamed cottage cheese. Whole-milk cheeses. Whole or 2%  milk (liquid, evaporated, or condensed). Whole buttermilk. Cream sauce or high-fat cheese sauce. Whole-milk yogurt. Fats and oils Meat fat, or shortening. Cocoa butter, hydrogenated oils, palm oil, coconut oil, palm kernel oil. Solid fats and shortenings, including bacon fat, salt pork, lard, and butter. Nondairy cream substitutes. Salad dressings with cheese or sour cream. Beverages Regular sodas and any drinks with added sugar. Sweets and desserts Frosting. Pudding. Cookies. Cakes. Pies. Milk chocolate or white chocolate. Buttered syrups. Full-fat ice cream or ice cream drinks. The items listed above may not be a complete list of foods and beverages to avoid. Contact a dietitian for more information. Summary Heart-healthy meal planning includes limiting unhealthy fats, increasing healthy fats, limiting salt (sodium) intake and making other diet and lifestyle changes. Lose weight if you are overweight. Losing just 5-10% of your body weight can help your overall health and prevent diseases such as diabetes and heart disease. Focus on eating a balance of foods, including fruits and vegetables, low-fat or nonfat dairy, lean protein, nuts and legumes, whole grains, and heart-healthy oils and fats. This information is not intended to replace advice given to you by your health care provider. Make sure you discuss any questions you have with your health care provider. Document Revised: 07/22/2021 Document Reviewed: 07/22/2021 Elsevier Patient Education  [place].

## 2022-10-28 ENCOUNTER — Telehealth: Payer: Self-pay | Admitting: Pulmonary Disease

## 2022-10-28 ENCOUNTER — Other Ambulatory Visit: Payer: Self-pay

## 2022-10-28 MED ORDER — SYMBICORT 160-4.5 MCG/ACT IN AERO: INHALATION_SPRAY | RESPIRATORY_TRACT | 0 refills | Status: DC

## 2022-10-28 NOTE — Telephone Encounter (Signed)
Patient would like a new script called in for Symbicort.  Patient would like it sent to the [facility] on [address] in [place].  Please advise.

## 2022-10-28 NOTE — Telephone Encounter (Signed)
Patient needed a one time fill of Symbicort sent to pharmacy. She advises she waited to late [facility]. I advised patient to call back in about 1-2 weeks before running out so we can get the refills sent. She verbalized understanding. NFN

## 2023-01-08 ENCOUNTER — Other Ambulatory Visit: Payer: Self-pay | Admitting: Internal Medicine

## 2023-01-13 NOTE — Telephone Encounter (Signed)
SYMBICORT 160-4.5 MCG/ACT inhaler [[id number]]  pt. Calling needs refills sent to pharmacy she is totally out

## 2023-01-14 MED ORDER — SYMBICORT 160-4.5 MCG/ACT IN AERO: INHALATION_SPRAY | RESPIRATORY_TRACT | 3 refills | Status: DC

## 2023-01-14 NOTE — Telephone Encounter (Signed)
Left a voicemail for patient to let her know medication was sent into her pharmacy . Nothing else further needed.

## 2023-02-12 ENCOUNTER — Other Ambulatory Visit (HOSPITAL_BASED_OUTPATIENT_CLINIC_OR_DEPARTMENT_OTHER): Payer: Self-pay

## 2023-02-12 MED ORDER — SYMBICORT 160-4.5 MCG/ACT IN AERO: INHALATION_SPRAY | RESPIRATORY_TRACT | 3 refills | Status: DC

## 2023-03-16 ENCOUNTER — Encounter (HOSPITAL_BASED_OUTPATIENT_CLINIC_OR_DEPARTMENT_OTHER): Payer: Self-pay | Admitting: Pulmonary Disease

## 2023-03-16 ENCOUNTER — Ambulatory Visit (HOSPITAL_BASED_OUTPATIENT_CLINIC_OR_DEPARTMENT_OTHER): Payer: Medicare Other | Admitting: Pulmonary Disease

## 2023-03-16 VITALS — BP 128/70 | HR 81 | Ht 60.0 in | Wt 196.0 lb

## 2023-03-16 DIAGNOSIS — J45998 Other asthma: Secondary | ICD-10-CM

## 2023-03-16 DIAGNOSIS — Z23 Encounter for immunization: Secondary | ICD-10-CM | POA: Diagnosis not present

## 2023-03-16 DIAGNOSIS — D862 Sarcoidosis of lung with sarcoidosis of lymph nodes: Secondary | ICD-10-CM | POA: Diagnosis not present

## 2023-03-16 DIAGNOSIS — G4733 Obstructive sleep apnea (adult) (pediatric): Secondary | ICD-10-CM | POA: Diagnosis not present

## 2023-03-16 MED ORDER — SYMBICORT 160-4.5 MCG/ACT IN AERO: INHALATION_SPRAY | RESPIRATORY_TRACT | 3 refills | Status: DC

## 2023-03-16 NOTE — Progress Notes (Signed)
Subjective:    Patient ID: Lindsey Copeland, female    DOB: 1961-05-06, 62 y.o.   MRN: [id number]  HPI  62 yo former smoker followed for Sarcoid and moderate OSA  She had asthma as a child, outgrew it and then symptoms returned after a chest cold in winter of 2013.     She was restarted on CPAP in 2017 after an initial failure in 2014   PMH -  Graves' disease s/p RAI , esophageal dysmotility s/p esophageal dilatation  Visual migraines Glaucoma  39-month follow-up visit. She needs refills on Symbicort.  Breathing is doing okay.  She had to use albuterol when it was very hot.  When Symbicort gets to the yellow mark she feels it is not as effective to take 2 puffs. CPAP is working well she had a recent vacation to [place] in [place] and took her machine with her. No problems with mask or pressure Breathing is generally worse during spring and fall  Significant tests/ events reviewed   Esophagram  12/2017 >>  Dysmotility & stasis >> referred to GI   01/2013 PSG -wt 190 pounds, BMI 34 showed predominant hypopneas during supine sleep and with AHI of 16 events per hour. Nonsupine AHI was only 3 events per hour.     Spirometry 11/2021 no airway obstruction PFTs 07/2012 nml lung volumes, no obs, DLCO 71% PFT 10/2014 >> ratio 80, FVC 104%, FEV1 108%, DLCO 83%-normal 06/2017 spiro >> nml, FVC 92%     CT chest 05/2013 Mediastinal/bilateral hilar lymphadenopathy, Peribronchovascular nodularity throughout all lobes, numerous lesions in the spleen. HRCT 08/2017 >> decreased lymphadenopathy, mild 1 to 2 mm nodules along fissures consistent with sarcoidosis   CT coronaries 06/2020 no significant lung disease  Review of Systems neg for any significant sore throat, dysphagia, itching, sneezing, nasal congestion or excess/ purulent secretions, fever, chills, sweats, unintended wt loss, pleuritic or exertional cp, hempoptysis, orthopnea pnd or change in chronic leg swelling. Also denies presyncope,  palpitations, heartburn, abdominal pain, nausea, vomiting, diarrhea or change in bowel or urinary habits, dysuria,hematuria, rash, arthralgias, visual complaints, headache, numbness weakness or ataxia.     Objective:   Physical Exam  Gen. Pleasant, obese, in no distress ENT - no lesions, no post nasal drip Neck: No JVD, no thyromegaly, no carotid bruits Lungs: no use of accessory muscles, no dullness to percussion, decreased without rales or rhonchi  Cardiovascular: Rhythm regular, heart sounds  normal, no murmurs or gallops, no peripheral edema Musculoskeletal: No deformities, no cyanosis or clubbing , no tremors       Assessment & Plan:

## 2023-03-16 NOTE — Assessment & Plan Note (Signed)
CPAP download was reviewed which shows excellent control of events on auto settings 8 to 12 cm with average pressure of 11 cm.  CPAP is definitely helped improve her daytime somnolence and fatigue. CPAP supplies will be renewed for a year Weight loss encouraged, compliance with goal of at least 4-6 hrs every night is the expectation. Advised against medications with sedative side effects Cautioned against driving when sleepy - understanding that sleepiness will vary on a day to day basis

## 2023-03-16 NOTE — Assessment & Plan Note (Signed)
Refills on Symbicort. Use albuterol for rescue

## 2023-03-16 NOTE — Assessment & Plan Note (Signed)
Appears stable, inactive based on CT coronaries from 2022

## 2023-03-16 NOTE — Patient Instructions (Signed)
x flu and RSV shot recommended  x refills on Symbicort x 5

## 2023-03-23 ENCOUNTER — Telehealth: Payer: Self-pay | Admitting: Pulmonary Disease

## 2023-03-23 NOTE — Telephone Encounter (Signed)
PT states [facility] has an issue with her Symbicort RX and they told her to call us to let us know. She did not know what the issue is, they didn't say. I am thinking [facility] sent [place] a fax. Please look into issue.  Her # is [phone]

## 2023-03-30 ENCOUNTER — Other Ambulatory Visit: Payer: Self-pay | Admitting: *Deleted

## 2023-03-30 MED ORDER — BUDESONIDE-FORMOTEROL FUMARATE 160-4.5 MCG/ACT IN AERO: INHALATION_SPRAY | RESPIRATORY_TRACT | 3 refills | Status: DC

## 2023-03-30 NOTE — Telephone Encounter (Signed)
LMTCB with correct pharmacy information. [facility] in chart. I looks like rx says sample and not normal but awaiting call back from patient.

## 2023-03-30 NOTE — Telephone Encounter (Signed)
Refill sent Patient states issue is ins prefers generic over brand.[facility]

## 2023-04-30 ENCOUNTER — Other Ambulatory Visit (HOSPITAL_BASED_OUTPATIENT_CLINIC_OR_DEPARTMENT_OTHER): Payer: Self-pay

## 2023-04-30 MED ORDER — BUDESONIDE-FORMOTEROL FUMARATE 160-4.5 MCG/ACT IN AERO: INHALATION_SPRAY | RESPIRATORY_TRACT | 3 refills | Status: DC

## 2023-06-01 ENCOUNTER — Ambulatory Visit: Payer: Medicare Other | Admitting: Internal Medicine

## 2023-06-01 ENCOUNTER — Encounter: Payer: Self-pay | Admitting: Internal Medicine

## 2023-06-01 VITALS — BP 112/70 | HR 68 | Ht 60.0 in | Wt 193.2 lb

## 2023-06-01 DIAGNOSIS — R051 Acute cough: Secondary | ICD-10-CM | POA: Diagnosis not present

## 2023-06-01 DIAGNOSIS — J019 Acute sinusitis, unspecified: Secondary | ICD-10-CM | POA: Diagnosis not present

## 2023-06-01 MED ORDER — PREDNISONE 10 MG PO TABS: ORAL_TABLET | ORAL | 0 refills | Status: DC

## 2023-06-01 MED ORDER — AZITHROMYCIN 250 MG PO TABS: ORAL_TABLET | ORAL | 0 refills | Status: DC

## 2023-06-01 NOTE — Patient Instructions (Addendum)
ICD-10-CM   1. Acute non-recurrent sinusitis, unspecified location  J01.90     2. Acute cough  R05.1       It appears that you are having severe sinus infection associated with reactive cough  Plan - Stop doxycycline - START Z PAK - Please take prednisone 40 mg x1 day, then 30 mg x1 day, then 20 mg x1 day, then 10 mg x1 day, and then 5 mg x1 day and stop -Buy over-the-counter OXYMETAZOLINE or Afrin nasal spray and use it as needed but only for 3 days to open up the nose -Also get saline nasal spray and use it multiple times daily to break up all the mucus  -If this does not work you can buy over-the-counter Nettie pot and then use it -Hopefully in a week you are doing much better -If you are getting worse please call us or go to the ER  -Continue other medications as usual  Follow-up - Return to see Dr. Vassie Loll as scheduled as before

## 2023-06-01 NOTE — Progress Notes (Signed)
Patient ID: Anaclara Bufkin, female    DOB: 11/28/1960, 62 y.o.   MRN: [id number]  HPI  62 yo former smoker followed for Sarcoid and moderate OSA  She had asthma as a child, outgrew it and then symptoms returned after a chest cold in winter of 2013.     She was restarted on CPAP in 2017 after an initial failure in 2014   PMH -  Graves' disease s/p RAI , esophageal dysmotility s/p esophageal dilatation  Visual migraines Glaucoma  33-month follow-up visit. She needs refills on Symbicort.  Breathing is doing okay.  She had to use albuterol when it was very hot.  When Symbicort gets to the yellow mark she feels it is not as effective to take 2 puffs. CPAP is working well she had a recent vacation to [place] in [place] and took her machine with her. No problems with mask or pressure Breathing is generally worse during spring and fall  Significant tests/ events reviewed   Esophagram  12/2017 >>  Dysmotility & stasis >> referred to GI   01/2013 PSG -wt 190 pounds, BMI 34 showed predominant hypopneas during supine sleep and with AHI of 16 events per hour. Nonsupine AHI was only 3 events per hour.     Spirometry 11/2021 no airway obstruction PFTs 07/2012 nml lung volumes, no obs, DLCO 71% PFT 10/2014 >> ratio 80, FVC 104%, FEV1 108%, DLCO 83%-normal 06/2017 spiro >> nml, FVC 92%     CT chest 05/2013 Mediastinal/bilateral hilar lymphadenopathy, Peribronchovascular nodularity throughout all lobes, numerous lesions in the spleen. HRCT 08/2017 >> decreased lymphadenopathy, mild 1 to 2 mm nodules along fissures consistent with sarcoidosis   CT coronaries 06/2020 no significant lung disease  OV 06/01/2023  Subjective:  Patient ID: Jodie Echevaria, female , DOB: 1961/06/19 , age 37 y.o. , MRN: [id number] , ADDRESS: [id number] [place] [address] PCP Jarrett Soho, PA-C Patient Care Team: Jarrett Soho, PA-C as PCP - General (Family Medicine) Quintella Reichert, MD as PCP - Cardiology  (Cardiology) Storm Frisk, MD as Attending Physician (Pulmonary Disease)  This Provider for this visit: Treatment Team:  Attending Provider: Kalman Shan, MD    06/01/2023 -   Chief Complaint  Patient presents with   Acute Visit    Pt c/o Cough with yellow sputum, Bilateral Ear Pain, Wheezing, and SOB for 2 weeks.     HPI Vernida Pownell 62 y.o. -patient of Dr. Vassie Loll with sleep apnea and also history of sarcoidosis.  She is here for an acute visit to see Dr. Marchelle Gearing.  She says she started with a sinus infection 2 weeks ago and it is not getting better.  For the last 7 days she is felt feverish.  On 05/27/2023 she is a primary care was given doxycycline.  Tomorrow is the last day.  She is also taking benzonatate cough syrup but overall is not better significant cough with yellow mucus otalgia present.  The nose is fully blocked she has not been using any Afrin nasal spray or saline nasal spray or Nettie pot.  She is coughing quite a bit and also having some wheezing.  She not able to use her CPAP but no chills no admissions to the hospital.     PFT     Latest Ref Rng & Units 10/31/2014   11:03 AM  ILD indicators  FVC-Pre L 2.61   FVC-Predicted Pre % 104   FVC-Post L 2.60   FVC-Predicted Post % 104  TLC L 3.87   TLC Predicted % 82   DLCO uncorrected ml/min/mmHg 17.49   DLCO UNC %Pred % 83       LAB RESULTS last 96 hours No results found.  LAB RESULTS last 90 days No results found for this or any previous visit (from the past 2160 hour(s)).       has a past medical history of Arthritis, Asthma, Carpal tunnel syndrome, COPD (chronic obstructive pulmonary disease) (HCC), Dysrhythmia, GERD (gastroesophageal reflux disease), Graves disease, Hyperlipidemia, Hyperthyroidism, Leaky heart valve, Lumbar spinal stenosis, Sarcoidosis, Sleep apnea, Type 2 diabetes mellitus (HCC), and Vitamin D deficiency.   reports that she quit smoking about 20 years ago. Her smoking use  included cigarettes. She started smoking about 30 years ago. She has a 1 pack-year smoking history. She has never used smokeless tobacco.  Past Surgical History:  Procedure Laterality Date   CARPAL TUNNEL RELEASE Right 02/20/2015   Procedure: RIGHT CARPAL TUNNEL RELEASE;  Surgeon: Cindee Salt, MD;  Location: [facility];  Service: Orthopedics;  Laterality: Right;  ANESTHESIA:  IV REGIONAL FAB   CARPAL TUNNEL RELEASE Left 10/09/2015   Procedure: LEFT CARPAL TUNNEL RELEASE;  Surgeon: Cindee Salt, MD;  Location: [facility];  Service: Orthopedics;  Laterality: Left;   radioactive iodine treatment     right knee arthroscopy     SHOULDER ARTHROSCOPY     TUBAL LIGATION     ULNAR NERVE REPAIR     VIDEO BRONCHOSCOPY  07/30/2012   Procedure: VIDEO BRONCHOSCOPY WITH FLUORO;  Surgeon: Storm Frisk, MD;  Location: [facility];  Service: Cardiopulmonary;  Laterality: N/A;    Allergies  Allergen Reactions   Sulfa Antibiotics Hives    hives   Indomethacin Palpitations   Robaxin [Methocarbamol] Palpitations    Immunization History  Administered Date(s) Administered   Influenza Split 04/08/2013, 03/30/2014, 04/16/2016, 03/28/2017, 04/19/2019, 03/29/2020, 04/30/2021   Influenza Whole 04/30/2012   Influenza, Seasonal, Injecte, Preservative Fre 03/16/2023   Influenza,inj,Quad PF,6+ Mos 03/27/2015, 04/18/2016, 03/23/2017, 05/19/2018, 03/15/2019, 02/28/2021   Influenza-Unspecified 03/30/2014   PFIZER Comirnaty(Gray Top)Covid-19 Tri-Sucrose Vaccine 10/16/2020   PFIZER(Purple Top)SARS-COV-2 Vaccination 07/30/2019, 08/28/2019, 04/20/2020   Pfizer Covid-19 Vaccine Bivalent Booster 79yrs & up 05/28/2021   Pneumococcal Polysaccharide-23 08/21/2017   Td 12/15/2002   Tdap 12/16/2011    Family History  Problem Relation Age of Onset   Asthma Brother    Diabetes Mother    Heart failure Mother    Glaucoma Mother    Heart attack Brother    Diabetes Sister    Breast cancer  Sister    Pancreatic cancer Maternal Aunt    Colon cancer Neg Hx    Stomach cancer Neg Hx      Current Outpatient Medications:    azithromycin (ZITHROMAX) 250 MG tablet, 500 mg azithromycin on day 1 followed by 250 mg for the next 4 days daily, Disp: 6 each, Rfl: 0   doxycycline (VIBRA-TABS) 100 MG tablet, Take 100 mg by mouth 2 (two) times daily., Disp: , Rfl:    albuterol (PROAIR HFA) 108 (90 Base) MCG/ACT inhaler, Inhale 2 puffs into the lungs every 6 (six) hours as needed for wheezing or shortness of breath. Fill insurance preference, Disp: 3 each, Rfl: 3   albuterol (PROVENTIL) (2.5 MG/3ML) 0.083% nebulizer solution, Take 3 mLs (2.5 mg total) by nebulization every 6 (six) hours as needed for wheezing or shortness of breath (every 6 hours AND as needed for SOB)., Disp: 75 mL, Rfl: 6  aspirin 81 MG tablet, Take 81 mg by mouth daily., Disp: , Rfl:    benzonatate (TESSALON) 100 MG capsule, Take 100 mg by mouth 3 (three) times daily as needed., Disp: , Rfl:    Blood Glucose Monitoring Suppl (FREESTYLE FREEDOM LITE) w/Device KIT, as directed finger stick once a day (DX e11.69, Disp: , Rfl:    brimonidine (ALPHAGAN) 0.15 % ophthalmic solution, , Disp: , Rfl:    budesonide-formoterol (SYMBICORT) 160-4.5 MCG/ACT inhaler, INHALE 2 PUFFS BY MOUTH TWICE DAILY, Disp: 30.6 g, Rfl: 3   Calcium Carbonate Antacid (TUMS E-X 750 PO), Take by mouth as needed., Disp: , Rfl:    Cholecalciferol (VITAMIN D-3) 1000 UNITS CAPS, Take 1 capsule by mouth daily. , Disp: , Rfl:    diphenhydrAMINE (BENADRYL) 25 MG tablet, as needed., Disp: , Rfl:    fexofenadine (ALLEGRA) 180 MG tablet, Take by mouth as needed., Disp: , Rfl:    fluticasone (FLONASE) 50 MCG/ACT nasal spray, Place 2 sprays into both nostrils daily as needed for allergies or rhinitis., Disp: 48 g, Rfl: 3   gabapentin (NEURONTIN) 100 MG capsule, Take 100 mg by mouth at bedtime., Disp: , Rfl:    ibuprofen (ADVIL,MOTRIN) 800 MG tablet, Take 800 mg by mouth  every 8 (eight) hours as needed., Disp: , Rfl:    ketoconazole (NIZORAL) 2 % cream, Apply 1 application topically 2 (two) times daily., Disp: , Rfl:    Lancets (FREESTYLE) lancets, as directed finger stick once a dayDX E11.69), Disp: , Rfl:    Lancets (FREESTYLE) lancets, as directed finger stick once a day prn for 90 days, Disp: , Rfl:    latanoprost (XALATAN) 0.005 % ophthalmic solution, 1 drop at bedtime., Disp: , Rfl:    Lifitegrast (XIIDRA) 5 % SOLN, Place 1 drop into both eyes 2 times daily., Disp: , Rfl:    metFORMIN (GLUCOPHAGE) 500 MG tablet, Take 1,000 mg by mouth 2 (two) times daily with a meal., Disp: , Rfl:    Multiple Vitamin (MULTIVITAMIN) tablet, Take 1 tablet by mouth daily., Disp: , Rfl:    omeprazole (PRILOSEC) 20 MG capsule, Take 20 mg by mouth daily as needed (indigestion). , Disp: , Rfl:    pravastatin (PRAVACHOL) 80 MG tablet, Take 80 mg by mouth daily., Disp: , Rfl:    predniSONE (DELTASONE) 10 MG tablet, Please take prednisone 40 mg x1 day, then 30 mg x1 day, then 20 mg x1 day, then 10 mg x1 day, and then 5 mg x1 day and stop, Disp: 11 tablet, Rfl: 0   SUMAtriptan (IMITREX) 50 MG tablet, Take 1 tablet (50 mg total) by mouth every 2 (two) hours as needed for migraine. May repeat in 2 hours if headache persists or recurs., Disp: 10 tablet, Rfl: 6   tiZANidine (ZANAFLEX) 4 MG capsule, Take by mouth as needed., Disp: , Rfl:       Objective:   Vitals:   06/01/23 1416  BP: 112/70  Pulse: 68  SpO2: 97%  Weight: 193 lb 3.2 oz (87.6 kg)  Height: 5' (1.524 m)    Estimated body mass index is 37.73 kg/m as calculated from the following:   Height as of this encounter: 5' (1.524 m).   Weight as of this encounter: 193 lb 3.2 oz (87.6 kg).  @WEIGHTCHANGE @  [employer]   06/01/23 1416  Weight: 193 lb 3.2 oz (87.6 kg)     Physical Exam   General: No distress.  Sounds like she has a sinus infection O2  at rest: no Cane present: no Sitting in wheel chair: no Frail:  no Obese: YES Neuro: Alert and Oriented x 3. GCS 15. Speech normal Psych: Pleasant Resp:  Barrel Chest - no.  Wheeze - no, Crackles - no, No overt respiratory distress CVS: Normal heart sounds. Murmurs - no Ext: Stigmata of Connective Tissue Disease - no HEENT: Normal upper airway. PEERL +. No post nasal drip        Assessment:       ICD-10-CM   1. Acute non-recurrent sinusitis, unspecified location  J01.90     2. Acute cough  R05.1          Plan:     Patient Instructions     ICD-10-CM   1. Acute non-recurrent sinusitis, unspecified location  J01.90     2. Acute cough  R05.1       It appears that you are having severe sinus infection associated with reactive cough  Plan - Stop doxycycline - START Z PAK - Please take prednisone 40 mg x1 day, then 30 mg x1 day, then 20 mg x1 day, then 10 mg x1 day, and then 5 mg x1 day and stop -Buy over-the-counter OXYMETAZOLINE or Afrin nasal spray and use it as needed but only for 3 days to open up the nose -Also get saline nasal spray and use it multiple times daily to break up all the mucus  -If this does not work you can buy over-the-counter Nettie pot and then use it -Hopefully in a week you are doing much better -If you are getting worse please call us or go to the ER  -Continue other medications as usual  Follow-up - Return to see Dr. Vassie Loll as scheduled as before     FOLLOWUP Return for [place] per prior schedule.    SIGNATURE    Dr. Kalman Shan, M.D., F.C.C.P,  Pulmonary and Critical Care Medicine Staff Physician, [facility] Director - Interstitial Lung Disease  Program  Pulmonary Fibrosis [facility] at [facility] [place], [place], [address]  Pager: [phone], If no answer or between  15:00h - 7:00h: call [phone] Telephone: [phone]  2:48 PM 06/01/2023

## 2023-07-30 ENCOUNTER — Encounter (HOSPITAL_BASED_OUTPATIENT_CLINIC_OR_DEPARTMENT_OTHER): Payer: Self-pay | Admitting: Pulmonary Disease

## 2023-07-30 ENCOUNTER — Ambulatory Visit (HOSPITAL_BASED_OUTPATIENT_CLINIC_OR_DEPARTMENT_OTHER): Payer: Medicare Other | Admitting: Pulmonary Disease

## 2023-07-30 VITALS — BP 116/70 | HR 69 | Ht 60.0 in | Wt 194.7 lb

## 2023-07-30 DIAGNOSIS — J45909 Unspecified asthma, uncomplicated: Secondary | ICD-10-CM

## 2023-07-30 DIAGNOSIS — G4733 Obstructive sleep apnea (adult) (pediatric): Secondary | ICD-10-CM

## 2023-07-30 DIAGNOSIS — H9319 Tinnitus, unspecified ear: Secondary | ICD-10-CM | POA: Diagnosis not present

## 2023-07-30 DIAGNOSIS — D869 Sarcoidosis, unspecified: Secondary | ICD-10-CM | POA: Diagnosis not present

## 2023-07-30 DIAGNOSIS — J45998 Other asthma: Secondary | ICD-10-CM

## 2023-07-30 MED ORDER — BENZONATATE 100 MG PO CAPS: 100.0000 mg | ORAL_CAPSULE | Freq: Three times a day (TID) | ORAL | 3 refills | Status: AC | PRN

## 2023-07-30 NOTE — Progress Notes (Signed)
Subjective:    Patient ID: Lindsey Copeland, female    DOB: 06-29-1961, 63 y.o.   MRN: [id number]  HPI  63 yo former smoker followed for Sarcoid and moderate OSA  She had asthma as a child, outgrew it and then symptoms returned after a chest cold in winter of 2013.     She was restarted on CPAP in 2017 after an initial failure in 2014   PMH -  Graves' disease s/p RAI , esophageal dysmotility s/p esophageal dilatation  Visual migraines Glaucoma  Chief Complaint  Patient presents with  . Follow-up    Runny nose with coughing. Also ringing in ears.  Needs refill on benzonatate.    Discussed the use of AI scribe software for clinical note transcription with the patient, who gave verbal consent to proceed.  History of Present Illness   The patient, with a history of asthma, sarcoidosis, and sleep apnea, presents with persistent ear ringing and a productive cough. The ear ringing, which has been ongoing for two years, varies in volume and is associated with sensitivity to loud noises. The patient reports that the symptom started after a COVID-19 infection and has not resolved. She has previously seen an ENT specialist, who found no abnormalities and suggested a hearing aid for difficulty hearing high pitches.  The patient also reports a productive cough, particularly in the mornings, which she manages with warm tea or water. She has been diagnosed with asthma and accepts the cough as a part of her condition. The cough has been somewhat alleviated by benzonatate, which she would like to refill. She was treatewd for a sinus infection with doxy, zpak & prednisone in december  The patient's asthma is managed with twice-daily Symbicort, which she reports as necessary for her comfort. She has not needed a nebulizer in the past month. She also reports improved sleep apnea symptoms with consistent CPAP use over the past two weeks.       CPAP download shows good control of events on auto settings 8  to 12 cm with average pressure 11 maximum pressure 11.7 cm she has mild leak.  She is compliant with a few missed nights average of 5.5 hours per night  Significant tests/ events reviewed   Esophagram  12/2017 >>  Dysmotility & stasis >> referred to GI   01/2013 PSG -wt 190 pounds, BMI 34 showed predominant hypopneas during supine sleep and with AHI of 16 events per hour. Nonsupine AHI was only 3 events per hour.     Spirometry 11/2021 no airway obstruction PFTs 07/2012 nml lung volumes, no obs, DLCO 71% PFT 10/2014 >> ratio 80, FVC 104%, FEV1 108%, DLCO 83%-normal 06/2017 spiro >> nml, FVC 92%     CT chest 05/2013 Mediastinal/bilateral hilar lymphadenopathy, Peribronchovascular nodularity throughout all lobes, numerous lesions in the spleen. HRCT 08/2017 >> decreased lymphadenopathy, mild 1 to 2 mm nodules along fissures consistent with sarcoidosis   CT coronaries 06/2020 no significant lung disease  Review of Systems neg for any significant sore throat, dysphagia, itching, sneezing, nasal congestion or excess/ purulent secretions, fever, chills, sweats, unintended wt loss, pleuritic or exertional cp, hempoptysis, orthopnea pnd or change in chronic leg swelling. Also denies presyncope, palpitations, heartburn, abdominal pain, nausea, vomiting, diarrhea or change in bowel or urinary habits, dysuria,hematuria, rash, arthralgias, visual complaints, headache, numbness weakness or ataxia.     Objective:   Physical Exam   Gen. Pleasant, obese, in no distress ENT - no lesions, no post nasal drip Neck:  No JVD, no thyromegaly, no carotid bruits Lungs: no use of accessory muscles, no dullness to percussion, decreased without rales or rhonchi  Cardiovascular: Rhythm regular, heart sounds  normal, no murmurs or gallops, no peripheral edema Musculoskeletal: No deformities, no cyanosis or clubbing , no tremors      Assessment & Plan:   Assessment and Plan    Asthma Asthma is well-managed with  Symbicort twice daily. No nebulizer use in the past month. Uses a humidifier and air purifier at home, which helps. - Continue Symbicort twice daily - Refill benzonatate for  productive cough every morning. - Continue current management with warm tea and ginger-lemon tea  Sarcoidosis Sarcoidosis is well-managed. No specific issues discussed. - Continue current management  OSA on CPAP Sleep apnea managed with CPAP. Reports gaps in usage due to recent illness but has been consistent for the past two weeks. CPAP usage shows low event numbers and good seal. Emphasized the importance of consistent CPAP use. - Continue CPAP therapy - Ensure CPAP supplies are adequate  Tinnitus Tinnitus with fluctuating volume, ongoing for two years, possibly post-COVID. Reports occasional ear pain and decreased tolerance to loud noises. Previous ENT evaluation showed normal hearing except for high pitches. Discussed the need for further evaluation to determine underlying cause and potential treatment options. - Refer to a different ENT specialist for further evaluation.

## 2023-07-30 NOTE — Patient Instructions (Addendum)
Refer to a different ENT specialist for further evaluation of ringing  X refill on benzonatate

## 2023-09-29 ENCOUNTER — Encounter: Payer: Self-pay | Admitting: Cardiology

## 2023-09-29 ENCOUNTER — Ambulatory Visit: Payer: Medicare Other | Attending: Cardiology | Admitting: Cardiology

## 2023-09-29 VITALS — BP 110/78 | HR 66 | Ht 60.0 in | Wt 197.8 lb

## 2023-09-29 DIAGNOSIS — I5189 Other ill-defined heart diseases: Secondary | ICD-10-CM | POA: Insufficient documentation

## 2023-09-29 DIAGNOSIS — E7800 Pure hypercholesterolemia, unspecified: Secondary | ICD-10-CM | POA: Diagnosis present

## 2023-09-29 DIAGNOSIS — R0609 Other forms of dyspnea: Secondary | ICD-10-CM | POA: Diagnosis present

## 2023-09-29 DIAGNOSIS — Z87898 Personal history of other specified conditions: Secondary | ICD-10-CM | POA: Diagnosis present

## 2023-09-29 DIAGNOSIS — I340 Nonrheumatic mitral (valve) insufficiency: Secondary | ICD-10-CM | POA: Diagnosis present

## 2023-09-29 NOTE — Addendum Note (Signed)
 Addended by: Macie Burows on: 09/29/2023 10:35 AM   Modules accepted: Orders

## 2023-09-29 NOTE — Progress Notes (Signed)
 Cardiology Office Note    Date:  09/29/2023   ID:  Lindsey Copeland, DOB 11-08-1960, MRN [id number]  PCP:  Jarrett Soho, PA-C  Cardiologist:  Armanda Magic, MD   Chief Complaint  Patient presents with   Follow-up    Hx of CP, HLD    History of Present Illness:  Lindsey Copeland is a 63 y.o. female with a history of chest pain and SOB and multiple CRF for CAD including DM, dylipidemia and remote tobacco use. The patient also has a history of GERD.  She has a history of obstructive sleep apnea and COPD and uses a CPAP machine.  Dr. Vassie Loll is her pulmonologist.  Nuclear stress test 2019 showed no ischemia.  She has chronic DOE from COPD and sarcoid which is under control with inhalers. 2D echo was done and showed normal LV function with grade 1 DD.  She is here today for followup and is doing well.  She has chronic DOE related to her COPD but is stable and she walks a mile a day with no problems. She denies any chest pain or pressure, PND, orthopnea, LE edema, dizziness, palpitations or syncope. She is compliant with her meds and is tolerating meds with no SE.   Past Medical History:  Diagnosis Date   Arthritis    Asthma    Carpal tunnel syndrome    COPD (chronic obstructive pulmonary disease) (HCC)    sarcoidosis   Dysrhythmia    palpitations   GERD (gastroesophageal reflux disease)    Graves disease    Hyperlipidemia    Hyperthyroidism    Leaky heart valve    Lumbar spinal stenosis    Sarcoidosis    Sleep apnea    CPAP   Type 2 diabetes mellitus (HCC)    Vitamin D deficiency     Past Surgical History:  Procedure Laterality Date   CARPAL TUNNEL RELEASE Right 02/20/2015   Procedure: RIGHT CARPAL TUNNEL RELEASE;  Surgeon: Cindee Salt, MD;  Location: [facility];  Service: Orthopedics;  Laterality: Right;  ANESTHESIA:  IV REGIONAL FAB   CARPAL TUNNEL RELEASE Left 10/09/2015   Procedure: LEFT CARPAL TUNNEL RELEASE;  Surgeon: Cindee Salt, MD;  Location: [facility];  Service: Orthopedics;  Laterality: Left;   radioactive iodine treatment     right knee arthroscopy     SHOULDER ARTHROSCOPY     TUBAL LIGATION     ULNAR NERVE REPAIR     VIDEO BRONCHOSCOPY  07/30/2012   Procedure: VIDEO BRONCHOSCOPY WITH FLUORO;  Surgeon: Storm Frisk, MD;  Location: [facility];  Service: Cardiopulmonary;  Laterality: N/A;    Current Medications: Outpatient Medications Prior to Visit  Medication Sig Dispense Refill   albuterol (PROAIR HFA) 108 (90 Base) MCG/ACT inhaler Inhale 2 puffs into the lungs every 6 (six) hours as needed for wheezing or shortness of breath. Fill insurance preference 3 each 3   albuterol (PROVENTIL) (2.5 MG/3ML) 0.083% nebulizer solution Take 3 mLs (2.5 mg total) by nebulization every 6 (six) hours as needed for wheezing or shortness of breath (every 6 hours AND as needed for SOB). 75 mL 6   aspirin 81 MG tablet Take 81 mg by mouth daily.     benzonatate (TESSALON) 100 MG capsule Take 1 capsule (100 mg total) by mouth 3 (three) times daily as needed. 20 capsule 3   Blood Glucose Monitoring Suppl (FREESTYLE FREEDOM LITE) w/Device KIT as directed finger stick once a day (DX e11.69  brimonidine (ALPHAGAN) 0.15 % ophthalmic solution      budesonide-formoterol (SYMBICORT) 160-4.5 MCG/ACT inhaler INHALE 2 PUFFS BY MOUTH TWICE DAILY 30.6 g 3   Calcium Carbonate Antacid (TUMS E-X 750 PO) Take by mouth as needed.     Cholecalciferol (VITAMIN D-3) 1000 UNITS CAPS Take 1 capsule by mouth daily.      diphenhydrAMINE (BENADRYL) 25 MG tablet as needed.     fexofenadine (ALLEGRA) 180 MG tablet Take by mouth as needed.     fluticasone (FLONASE) 50 MCG/ACT nasal spray Place 2 sprays into both nostrils daily as needed for allergies or rhinitis. 48 g 3   gabapentin (NEURONTIN) 100 MG capsule Take 100 mg by mouth at bedtime.     ibuprofen (ADVIL,MOTRIN) 800 MG tablet Take 800 mg by mouth every 8 (eight) hours as needed.     ketoconazole  (NIZORAL) 2 % cream Apply 1 application topically 2 (two) times daily.     Lancets (FREESTYLE) lancets as directed finger stick once a dayDX E11.69)     Lancets (FREESTYLE) lancets as directed finger stick once a day prn for 90 days     latanoprost (XALATAN) 0.005 % ophthalmic solution 1 drop at bedtime.     Lifitegrast (XIIDRA) 5 % SOLN Place 1 drop into both eyes 2 times daily.     metFORMIN (GLUCOPHAGE) 500 MG tablet Take 1,000 mg by mouth 2 (two) times daily with a meal.     Multiple Vitamin (MULTIVITAMIN) tablet Take 1 tablet by mouth daily.     omeprazole (PRILOSEC) 20 MG capsule Take 20 mg by mouth daily as needed (indigestion).      pravastatin (PRAVACHOL) 80 MG tablet Take 80 mg by mouth daily.     SUMAtriptan (IMITREX) 50 MG tablet Take 1 tablet (50 mg total) by mouth every 2 (two) hours as needed for migraine. May repeat in 2 hours if headache persists or recurs. 10 tablet 6   tiZANidine (ZANAFLEX) 4 MG capsule Take by mouth as needed.     No facility-administered medications prior to visit.     Allergies:   Sulfa antibiotics, Penicillins, Indomethacin, and Robaxin [methocarbamol]   Social History   Socioeconomic History   Marital status: Married    Spouse name: Not on file   Number of children: 3   Years of education: Not on file   Highest education level: Not on file  Occupational History   Occupation: Child psychotherapist  Tobacco Use   Smoking status: Former    Current packs/day: 0.00    Average packs/day: 0.1 packs/day for 10.0 years (1.0 ttl pk-yrs)    Types: Cigarettes    Start date: 06/30/1992    Quit date: 06/30/2002    Years since quitting: 21.2   Smokeless tobacco: Never  Vaping Use   Vaping status: Never Used  Substance and Sexual Activity   Alcohol use: Yes    Comment: occasionally   Drug use: No   Sexual activity: Not on file  Other Topics Concern   Not on file  Social History Narrative   Not on file   Social Drivers of Health   Financial Resource  Strain: Not on file  Food Insecurity: Not on file  Transportation Needs: Not on file  Physical Activity: Not on file  Stress: Not on file  Social Connections: Not on file     Family History:  The patient's family history includes Asthma in her brother; Breast cancer in her sister; Diabetes in her mother and sister;  Glaucoma in her mother; Heart attack in her brother; Heart failure in her mother; Pancreatic cancer in her maternal aunt.   ROS:   Please see the history of present illness.    Except what is stated in the HPI,  All other systems reviewed and are negative.   PHYSICAL EXAM:   VS:  BP 110/78   Pulse 66   Ht 5' (1.524 m)   Wt 197 lb 12.8 oz (89.7 kg)   SpO2 99%   BMI 38.63 kg/m     GEN: Well nourished, well developed in no acute distress HEENT: Normal NECK: No JVD; No carotid bruits LYMPHATICS: No lymphadenopathy CARDIAC:RRR, no murmurs, rubs, gallops RESPIRATORY:  Clear to auscultation without rales, wheezing or rhonchi  ABDOMEN: Soft, non-tender, non-distended MUSCULOSKELETAL:  No edema; No deformity  SKIN: Warm and dry NEUROLOGIC:  Alert and oriented x 3 PSYCHIATRIC:  Normal affect  Wt Readings from Last 3 Encounters:  09/29/23 197 lb 12.8 oz (89.7 kg)  07/30/23 194 lb 11.2 oz (88.3 kg)  06/01/23 193 lb 3.2 oz (87.6 kg)      Studies/Labs Reviewed:    Recent Labs: No results found for requested labs within last 365 days.   Lipid Panel No results found for: "CHOL", "TRIG", "HDL", "CHOLHDL", "VLDL", "LDLCALC", "LDLDIRECT"  Additional studies/ records that were reviewed today include:  none    ASSESSMENT:    1. History of chest pain   2. Pure hypercholesterolemia   3. DOE (dyspnea on exertion)   4. Diastolic dysfunction   5. Nonrheumatic mitral valve regurgitation       PLAN:  In order of problems listed above:  1.  Hx of Chest pain  -nuclear stress test in 2019 showed no ischemia and 2D echo was normal -CP had essentially resolved after  quitting her job and taking a course in stress management from the [place]. -had recurrent atypical chest pain after COVID 19 -Coronary CTA 06/2020 showed cor cal score of 0 and no CAD -2D echo 07/2020 with EF 60-65% with G1DD -she has not had any further CP  2.  HLD -LDL goal < 100 -followed by PCP  3.  Chronic DOE -related to COPD/Sarcoidosis and followed by Pulmonary -this is very stable  4.  Diastolic dysfunction -had G1DD on echo 2022 and normal LVF -does not appear volume overloaded on exam today -she walks 1 mile daily with no problems.   5.  Mitral Regurgitation -trivial on echo 2022 -repeat echo   Medication Adjustments/Labs and Tests Ordered: Current medicines are reviewed at length with the patient today.  Concerns regarding medicines are outlined above.  Medication changes, Labs and Tests ordered today are listed in the Patient Instructions below. There are no Patient Instructions on file for this visit.   Signed, Armanda Magic, MD  09/29/2023 10:33 AM    [facility] [address] [place], [place], [place]  [address] Phone: [phone]; Fax: [phone]

## 2023-09-29 NOTE — Patient Instructions (Signed)
 Medication Instructions:  Your physician recommends that you continue on your current medications as directed. Please refer to the Current Medication list given to you today.  *If you need a refill on your cardiac medications before your next appointment, please call your pharmacy*  Lab Work: NONE  If you have labs (blood work) drawn today and your tests are completely normal, you will receive your results only by: MyChart Message (if you have MyChart) OR A paper copy in the mail If you have any lab test that is abnormal or we need to change your treatment, we will call you to review the results.  Testing/Procedures: Your physician has requested that you have an echocardiogram. Echocardiography is a painless test that uses sound waves to create images of your heart. It provides your doctor with information about the size and shape of your heart and how well your heart's chambers and valves are working. This procedure takes approximately one hour. There are no restrictions for this procedure. Please do NOT wear cologne, perfume, aftershave, or lotions (deodorant is allowed). Please arrive 15 minutes prior to your appointment time.  Please note: We ask at that you not bring children with you during ultrasound (echo/ vascular) testing. Due to room size and safety concerns, children are not allowed in the ultrasound rooms during exams. Our front office staff cannot provide observation of children in our lobby area while testing is being conducted. An adult accompanying a patient to their appointment will only be allowed in the ultrasound room at the discretion of the ultrasound technician under special circumstances. We apologize for any inconvenience.   Follow-Up: At [facility], you and your health needs are our priority.  As part of our continuing mission to provide you with exceptional heart care, our providers are all part of one team.  This team includes your primary Cardiologist  (physician) and Advanced Practice Providers or APPs (Physician Assistants and Nurse Practitioners) who all work together to provide you with the care you need, when you need it.  Your next appointment:   12 month(s)  Provider:   Armanda Magic, MD     We recommend signing up for the patient portal called "MyChart".  Sign up information is provided on this After Visit Summary.  MyChart is used to connect with patients for Virtual Visits (Telemedicine).  Patients are able to view lab/test results, encounter notes, upcoming appointments, etc.  Non-urgent messages can be sent to your provider as well.   To learn more about what you can do with MyChart, go to [email or web address].   Other Instructions      1st Floor: - Lobby - Registration  - Pharmacy  - Lab - Cafe  2nd Floor: - PV Lab - Diagnostic Testing (echo, CT, nuclear med)  3rd Floor: - Vacant  4th Floor: - TCTS (cardiothoracic surgery) - AFib Clinic - Structural Heart Clinic - Vascular Surgery  - Vascular Ultrasound  5th Floor: - HeartCare Cardiology (general and EP) - Clinical Pharmacy for coumadin, hypertension, lipid, weight-loss medications, and med management appointments    Valet parking services will be available as well.

## 2023-10-01 ENCOUNTER — Ambulatory Visit (INDEPENDENT_AMBULATORY_CARE_PROVIDER_SITE_OTHER): Admitting: Audiology

## 2023-10-01 ENCOUNTER — Ambulatory Visit (INDEPENDENT_AMBULATORY_CARE_PROVIDER_SITE_OTHER): Admitting: Physician Assistant

## 2023-10-01 ENCOUNTER — Encounter (INDEPENDENT_AMBULATORY_CARE_PROVIDER_SITE_OTHER): Payer: Self-pay | Admitting: Physician Assistant

## 2023-10-01 DIAGNOSIS — H903 Sensorineural hearing loss, bilateral: Secondary | ICD-10-CM | POA: Diagnosis not present

## 2023-10-01 DIAGNOSIS — H9319 Tinnitus, unspecified ear: Secondary | ICD-10-CM | POA: Diagnosis not present

## 2023-10-01 DIAGNOSIS — H9313 Tinnitus, bilateral: Secondary | ICD-10-CM

## 2023-10-01 NOTE — Progress Notes (Signed)
 Dear Dr. Vassie Loll, Here is my assessment for our mutual patient, Lindsey Copeland. Thank you for allowing me the opportunity to care for your patient. Please do not hesitate to contact me should you have any other questions. Sincerely, Burna Forts PA-C  Otolaryngology Clinic Note Referring provider: Dr. Vassie Loll HPI:  Lindsey Copeland is a 63 y.o. female kindly referred by Dr. Vassie Loll   The patient presents today with complaints of tinnitus.  She notes that symptoms started approximately 2 years ago when she had COVID.  She notes after she was recovering from the illness the ringing started.  She notes a high-pitched hum and buzz in the bilateral ears.  She notes this is every day, it does fluctuate in intensity throughout the day.  She notes that symptoms are worse when she is going to bed, she also notes that the symptoms are improved with background noise.  She denies any significant pain, she notes an occasional ear infection during the winter but no persistent or reoccurring ear infections.  She denies any noted hearing loss but does note that when people talk fast she has difficulty understanding them.  She does note some minor noise sensitivity.  She denies any significant vertigo or dizziness.  No new medications.  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  She denies any head or neck surgery.    Independent Review of Additional Tests or Records:    Tympanometry: Right ear: Type A- Normal external ear canal volume with normal middle ear pressure and tympanic membrane compliance Left ear: Type A- Normal external ear canal volume with normal middle ear pressure and tympanic membrane compliance     Pure tone Audiometry: Both ears: Borderline normal to mild sensorineural hearing loss from 125 Hz - 8000 Hz.     Speech Audiometry: Right ear- Speech Reception Threshold (SRT) was obtained at 25 dBHL. Left ear-Speech Reception Threshold (SRT) was obtained at 25 dBHL.   Word Recognition  Score Tested using NU-6 (MLV) Right ear: 96% was obtained at a presentation level of 65 dBHL with contralateral masking which is deemed as  excellent. Left ear: 96% was obtained at a presentation level of 65 dBHL with contralateral masking which is deemed as  excellent.   PMH/Meds/All/SocHx/FamHx/ROS:   Past Medical History:  Diagnosis Date   Arthritis    Asthma    Carpal tunnel syndrome    COPD (chronic obstructive pulmonary disease) (HCC)    sarcoidosis   Dysrhythmia    palpitations   GERD (gastroesophageal reflux disease)    Graves disease    Hyperlipidemia    Hyperthyroidism    Leaky heart valve    Lumbar spinal stenosis    Sarcoidosis    Sleep apnea    CPAP   Type 2 diabetes mellitus (HCC)    Vitamin D deficiency      Past Surgical History:  Procedure Laterality Date   CARPAL TUNNEL RELEASE Right 02/20/2015   Procedure: RIGHT CARPAL TUNNEL RELEASE;  Surgeon: Cindee Salt, MD;  Location: [facility];  Service: Orthopedics;  Laterality: Right;  ANESTHESIA:  IV REGIONAL FAB   CARPAL TUNNEL RELEASE Left 10/09/2015   Procedure: LEFT CARPAL TUNNEL RELEASE;  Surgeon: Cindee Salt, MD;  Location: [facility];  Service: Orthopedics;  Laterality: Left;   radioactive iodine treatment     right knee arthroscopy     SHOULDER ARTHROSCOPY     TUBAL LIGATION     ULNAR NERVE REPAIR  VIDEO BRONCHOSCOPY  07/30/2012   Procedure: VIDEO BRONCHOSCOPY WITH FLUORO;  Surgeon: Storm Frisk, MD;  Location: [facility];  Service: Cardiopulmonary;  Laterality: N/A;    Family History  Problem Relation Age of Onset   Asthma Brother    Diabetes Mother    Heart failure Mother    Glaucoma Mother    Heart attack Brother    Diabetes Sister    Breast cancer Sister    Pancreatic cancer Maternal Aunt    Colon cancer Neg Hx    Stomach cancer Neg Hx      Social Connections: Not on file      Current Outpatient Medications:    albuterol (PROAIR HFA) 108 (90 Base)  MCG/ACT inhaler, Inhale 2 puffs into the lungs every 6 (six) hours as needed for wheezing or shortness of breath. Fill insurance preference, Disp: 3 each, Rfl: 3   albuterol (PROVENTIL) (2.5 MG/3ML) 0.083% nebulizer solution, Take 3 mLs (2.5 mg total) by nebulization every 6 (six) hours as needed for wheezing or shortness of breath (every 6 hours AND as needed for SOB)., Disp: 75 mL, Rfl: 6   aspirin 81 MG tablet, Take 81 mg by mouth daily., Disp: , Rfl:    benzonatate (TESSALON) 100 MG capsule, Take 1 capsule (100 mg total) by mouth 3 (three) times daily as needed., Disp: 20 capsule, Rfl: 3   Blood Glucose Monitoring Suppl (FREESTYLE FREEDOM LITE) w/Device KIT, as directed finger stick once a day (DX e11.69, Disp: , Rfl:    brimonidine (ALPHAGAN) 0.15 % ophthalmic solution, , Disp: , Rfl:    budesonide-formoterol (SYMBICORT) 160-4.5 MCG/ACT inhaler, INHALE 2 PUFFS BY MOUTH TWICE DAILY, Disp: 30.6 g, Rfl: 3   Calcium Carbonate Antacid (TUMS E-X 750 PO), Take by mouth as needed., Disp: , Rfl:    Cholecalciferol (VITAMIN D-3) 1000 UNITS CAPS, Take 1 capsule by mouth daily. , Disp: , Rfl:    diphenhydrAMINE (BENADRYL) 25 MG tablet, as needed., Disp: , Rfl:    fexofenadine (ALLEGRA) 180 MG tablet, Take by mouth as needed., Disp: , Rfl:    fluticasone (FLONASE) 50 MCG/ACT nasal spray, Place 2 sprays into both nostrils daily as needed for allergies or rhinitis., Disp: 48 g, Rfl: 3   gabapentin (NEURONTIN) 100 MG capsule, Take 100 mg by mouth at bedtime., Disp: , Rfl:    ibuprofen (ADVIL,MOTRIN) 800 MG tablet, Take 800 mg by mouth every 8 (eight) hours as needed., Disp: , Rfl:    ketoconazole (NIZORAL) 2 % cream, Apply 1 application topically 2 (two) times daily., Disp: , Rfl:    Lancets (FREESTYLE) lancets, as directed finger stick once a dayDX E11.69), Disp: , Rfl:    Lancets (FREESTYLE) lancets, as directed finger stick once a day prn for 90 days, Disp: , Rfl:    latanoprost (XALATAN) 0.005 % ophthalmic  solution, 1 drop at bedtime., Disp: , Rfl:    Lifitegrast (XIIDRA) 5 % SOLN, Place 1 drop into both eyes 2 times daily., Disp: , Rfl:    metFORMIN (GLUCOPHAGE) 500 MG tablet, Take 1,000 mg by mouth 2 (two) times daily with a meal., Disp: , Rfl:    Multiple Vitamin (MULTIVITAMIN) tablet, Take 1 tablet by mouth daily., Disp: , Rfl:    omeprazole (PRILOSEC) 20 MG capsule, Take 20 mg by mouth daily as needed (indigestion). , Disp: , Rfl:    pravastatin (PRAVACHOL) 80 MG tablet, Take 80 mg by mouth daily., Disp: , Rfl:    SUMAtriptan (IMITREX) 50 MG  tablet, Take 1 tablet (50 mg total) by mouth every 2 (two) hours as needed for migraine. May repeat in 2 hours if headache persists or recurs., Disp: 10 tablet, Rfl: 6   tiZANidine (ZANAFLEX) 4 MG capsule, Take by mouth as needed., Disp: , Rfl:    Physical Exam:   There were no vitals taken for this visit.  Pertinent Findings  CN II-XII intact Bilateral EAC clear and TM intact with well pneumatized middle ear spaces Weber 512: equal Rinne 512: AC > BC b/l  Anterior rhinoscopy: Septum midline; bilateral inferior turbinates with no hypertrophy No lesions of oral cavity/oropharynx; dentition wnl No obviously palpable neck masses/lymphadenopathy/thyromegaly No respiratory distress or stridor    Seprately Identifiable Procedures:  None  Impression & Plans:  Lindsey Copeland is a 63 y.o. female with the following   Tinnitus-   This is a 62 year old female presenting today with complaints of tinnitus.  She has no alarming signs or symptoms.  Reassuring audiogram.  No indication for further imaging or management.  Low suspicion for any vascular etiology.  The patient is happy knowing this and will continue to use background noise to help with her symptoms.  Have also given her referral to [facility] tinnitus clinic.  She was given strict return precautions in the event she develops any new or worsening signs or symptoms.  She verbalized understanding and  agreement to today's plan had no further questions or concerns.   - f/u PRN   Thank you for allowing me the opportunity to care for your patient. Please do not hesitate to contact me should you have any other questions.  Sincerely, Burna Forts PA-C [facility] Phone: [phone] Fax: [phone]  10/01/2023, 11:03 AM

## 2023-10-01 NOTE — Patient Instructions (Signed)
 [facility] Tinnitus [facility] [email or web address] > [email or web address] Dec 22, 2017 -- [facility], [address]  [address]  [place] [address]  map, [phone]  Fax [phone], Mon-Fri

## 2023-10-01 NOTE — Progress Notes (Signed)
  [address], [address] [place], [place] [address] [phone]  Audiological Evaluation    Name: Lindsey Copeland     DOB:   [birth year]      MRN:   [id number]                                                                                     Service Date: 10/01/2023     Accompanied by: unaccompanied    Patient comes today after Lindsey Mechanic, PA-C sent a referral for a hearing evaluation due to concerns with tinnitus.   Symptoms Yes Details  Hearing loss  [x]  Reports some difficulty hearing.  Tinnitus  [x]  Noticed in both ears after she had COVID-19 in both ears.  Ear pain/ infections/pressure  []  Pain if she is having an URI  Balance problems  []    Noise exposure history  [x]  Sports, concerts  Previous ear surgeries  []    Family history of hearing loss  [x]  Unclear - but biological mother had some in her 69's  Amplification  []    Other  []      Otoscopy: Right ear: Clear external ear canals and notable landmarks visualized on the tympanic membrane. Left ear:  Clear external ear canals and notable landmarks visualized on the tympanic membrane.  Tympanometry: Right ear: Type A- Normal external ear canal volume with normal middle ear pressure and tympanic membrane compliance Left ear: Type A- Normal external ear canal volume with normal middle ear pressure and tympanic membrane compliance    Pure tone Audiometry: Both ears: Borderline normal to mild sensorineural hearing loss from 125 Hz - 8000 Hz.    Speech Audiometry: Right ear- Speech Reception Threshold (SRT) was obtained at 25 dBHL. Left ear-Speech Reception Threshold (SRT) was obtained at 25 dBHL.   Word Recognition Score Tested using NU-6 (MLV) Right ear: 96% was obtained at a presentation level of 65 dBHL with contralateral masking which is deemed as  excellent. Left ear: 96% was obtained at a presentation level of 65 dBHL with contralateral masking which is deemed as  excellent.   The hearing test results  were completed under headphones and results are deemed to be of good reliability. Test technique:  conventional     Recommendations: Follow up with ENT as scheduled for today Return for a hearing evaluation  in 1-2 years, before if concerns with hearing changes arise or per MD recommendation. Use hearing protection only when exposed to  damaging loud sounds.  Consider various tinnitus strategies, including the use of a sound generator, hearing aids, and/or tinnitus retraining therapy. Consider a communication needs assessment after medical clearance for hearing aids is obtained.   Lindsey Copeland, AUD

## 2023-11-03 ENCOUNTER — Ambulatory Visit (HOSPITAL_COMMUNITY): Attending: Cardiology

## 2023-11-03 DIAGNOSIS — I340 Nonrheumatic mitral (valve) insufficiency: Secondary | ICD-10-CM | POA: Insufficient documentation

## 2023-11-03 LAB — ECHOCARDIOGRAM COMPLETE
Area-P 1/2: 3.72 cm2
S' Lateral: 2.1 cm

## 2023-11-04 ENCOUNTER — Telehealth: Payer: Self-pay | Admitting: Cardiology

## 2023-11-04 NOTE — Telephone Encounter (Signed)
 Spoke with patient concerning recent echo. No needs at this time    Jacqueline Matsu, MD 11/03/2023  5:14 PM EDT     Echo showed normal pumping function of heart EF 55-60% with increase stiffness of heart muscle called diastolic dysfunction.  Trivial leakiness of MV.  Trivial pericardial effusion

## 2023-11-04 NOTE — Telephone Encounter (Signed)
 Patient is returning phone call in regard to echo results.

## 2023-11-12 ENCOUNTER — Ambulatory Visit: Payer: Self-pay

## 2023-11-12 NOTE — Telephone Encounter (Signed)
-----   Message from Gaylyn Keas sent at 11/03/2023  5:14 PM EDT ----- Echo showed normal pumping function of heart EF 55-60% with increase stiffness of heart muscle called diastolic dysfunction.  Trivial leakiness of MV.  Trivial pericardial effusion

## 2023-11-12 NOTE — Telephone Encounter (Signed)
 Call to patient to give echo results, no answer.  LVM with no identifiers asking recipient to call [place] at our office #.

## 2023-11-26 NOTE — Telephone Encounter (Signed)
-----   Message from Gaylyn Keas sent at 11/03/2023  5:14 PM EDT ----- Echo showed normal pumping function of heart EF 55-60% with increase stiffness of heart muscle called diastolic dysfunction.  Trivial leakiness of MV.  Trivial pericardial effusion

## 2023-11-26 NOTE — Telephone Encounter (Signed)
 Call to patient to review that Echo showed normal pumping function of heart EF 55-60% with increase stiffness of heart muscle called diastolic dysfunction.  Trivial leakiness of MV and Trivial pericardial effusion present. Patient verbalizes understanding.

## 2023-11-27 ENCOUNTER — Encounter: Payer: Self-pay | Admitting: Cardiology

## 2024-01-12 ENCOUNTER — Telehealth: Payer: Self-pay | Admitting: Cardiology

## 2024-01-12 DIAGNOSIS — I3139 Other pericardial effusion (noninflammatory): Secondary | ICD-10-CM

## 2024-01-12 NOTE — Telephone Encounter (Signed)
 Pt wants to discuss her Echo results from May

## 2024-01-13 NOTE — Telephone Encounter (Signed)
 Pt is requesting a callback regarding her ECHO results. She stated it mentions her having fluid around her heart so she's very concerned. She reached out yesterday but still hasn't heard anything back. Please advise

## 2024-01-13 NOTE — Telephone Encounter (Signed)
 Patient called and wants to talk more about the echo results.  Specifically the trivial pericardial effusion.  Are any of her medications be a cause - she googled - specifically gabapentin, and also is there anything she can do to keep it from getting worse or can make it better.   Also what amount does trivial equal like an ounce?  Her mom almost died from this and had to have emergency surgery.     These concerns have been causing her a good deal of anxiety and sleepless nights.  Pt aware I will forward to Dr. Shlomo and her primary RN for review and further follow up.

## 2024-01-14 NOTE — Telephone Encounter (Signed)
 Reply from Dr. Shlomo: It says in the notes that her mother died of CHF which is not what a pericardial effusion is from. We see trivial pericardial effusions frequently and of no consequence. We usually do not work them up further. She may have had a cold at some point this year and go some inflammation of the lining of her heart called the pericardium that caused the fluid to form. I cannot tell her the exact measurment other that it is VERY MINIMAL. We can repeat another echo in 6 weeks to reassess if she would like    Pt responded by saying that earlier this year she had a significant respiratory infection.  She thinks it was early spring.  She would like to repeat the echo in 6 weeks.  Order placed, aware someone will call to schedule.  She states that if her insurance won't cover the next echo, she won't be able to pay for it so she wants to find that out first.  I adv I will send a message to our billing or precert department, but adv her to follow up on this as I do not follow that piece of things.  She voices understanding and agreement.

## 2024-01-19 ENCOUNTER — Encounter (HOSPITAL_COMMUNITY): Payer: Self-pay

## 2024-02-05 ENCOUNTER — Other Ambulatory Visit: Payer: Self-pay | Admitting: Adult Health

## 2024-03-08 ENCOUNTER — Ambulatory Visit (HOSPITAL_COMMUNITY)
Admission: RE | Admit: 2024-03-08 | Discharge: 2024-03-08 | Disposition: A | Discharge status: Home / Self Care | Source: Ambulatory Visit | Attending: Internal Medicine | Admitting: Internal Medicine

## 2024-03-08 DIAGNOSIS — I3139 Other pericardial effusion (noninflammatory): Secondary | ICD-10-CM | POA: Insufficient documentation

## 2024-03-08 LAB — ECHOCARDIOGRAM LIMITED
Area-P 1/2: 3.89 cm2
S' Lateral: 2.7 cm

## 2024-03-09 ENCOUNTER — Ambulatory Visit: Payer: Self-pay | Admitting: Cardiology

## 2024-03-10 ENCOUNTER — Other Ambulatory Visit: Payer: Self-pay

## 2024-03-10 ENCOUNTER — Emergency Department (HOSPITAL_BASED_OUTPATIENT_CLINIC_OR_DEPARTMENT_OTHER)

## 2024-03-10 ENCOUNTER — Emergency Department (HOSPITAL_BASED_OUTPATIENT_CLINIC_OR_DEPARTMENT_OTHER)
Admission: EM | Admit: 2024-03-10 | Discharge: 2024-03-10 | Disposition: A | Discharge status: Home / Self Care | Attending: Emergency Medicine | Admitting: Emergency Medicine

## 2024-03-10 DIAGNOSIS — M25572 Pain in left ankle and joints of left foot: Secondary | ICD-10-CM | POA: Diagnosis present

## 2024-03-10 DIAGNOSIS — E119 Type 2 diabetes mellitus without complications: Secondary | ICD-10-CM | POA: Diagnosis not present

## 2024-03-10 DIAGNOSIS — Z7984 Long term (current) use of oral hypoglycemic drugs: Secondary | ICD-10-CM | POA: Diagnosis not present

## 2024-03-10 DIAGNOSIS — Z7982 Long term (current) use of aspirin: Secondary | ICD-10-CM | POA: Diagnosis not present

## 2024-03-10 DIAGNOSIS — M79605 Pain in left leg: Secondary | ICD-10-CM

## 2024-03-10 LAB — CBG MONITORING, ED: Glucose-Capillary: 98 mg/dL (ref 70–99)

## 2024-03-10 MED ORDER — METHYLPREDNISOLONE 4 MG PO TBPK: ORAL_TABLET | ORAL | 0 refills | Status: DC

## 2024-03-10 MED ORDER — OXYCODONE HCL 5 MG PO TABS
5.0000 mg | ORAL_TABLET | Freq: Once | ORAL | Status: AC
Start: 2024-03-10 — End: 2024-03-10
  Administered 2024-03-10: 5 mg via ORAL
  Filled 2024-03-10: qty 1

## 2024-03-10 MED ORDER — CYCLOBENZAPRINE HCL 5 MG PO TABS: 5.0000 mg | ORAL_TABLET | Freq: Two times a day (BID) | ORAL | 0 refills | Status: AC | PRN

## 2024-03-10 NOTE — ED Provider Notes (Signed)
 Lindsey Copeland Provider Note   CSN: [id number] Arrival date & time: 03/10/24  1126     Patient presents with: Lindsey Copeland   Lindsey Copeland is a 63 y.o. female.   Patient with history of diabetes on metformin -- presents to the emergency department today for evaluation of left lower extremity pain.  She reports pain in her entire extremity from her foot to her hip.  She states that she was walking down 6 steps yesterday and twisted which caused her to have pain throughout the leg.  She reports history of spinal stenosis but states that this pain feels different.  She did not fall or hit her head.  She has taken Tylenol .  She has more pain with weightbearing.       Prior to Admission medications   Medication Sig Start Date End Date Taking? Authorizing Provider  albuterol  (PROVENTIL ) (2.5 MG/3ML) 0.083% nebulizer solution Take 3 mLs (2.5 mg total) by nebulization every 6 (six) hours as needed for wheezing or shortness of breath (every 6 hours AND as needed for SOB). 08/27/20   Jude Harden GAILS, MD  albuterol  (VENTOLIN  HFA) 108 (90 Base) MCG/ACT inhaler USE 2 INHALATIONS EVERY 6 HOURS AS NEEDED FOR WHEEZING OR SHORTNESS OF BREATH 02/05/24   Jude Harden GAILS, MD  aspirin 81 MG tablet Take 81 mg by mouth daily.    [provider]  benzonatate  (TESSALON ) 100 MG capsule Take 1 capsule (100 mg total) by mouth 3 (three) times daily as needed. 07/30/23   Jude Harden GAILS, MD  Blood Glucose Monitoring Suppl (FREESTYLE FREEDOM LITE) w/Device KIT as directed finger stick once a day (DX e11.69 07/05/21   [provider]  brimonidine (ALPHAGAN) 0.15 % ophthalmic solution  05/08/21   [provider]  budesonide -formoterol  (SYMBICORT ) 160-4.5 MCG/ACT inhaler INHALE 2 PUFFS BY MOUTH TWICE DAILY 04/30/23   Alva, Rakesh V, MD  Calcium Carbonate Antacid (TUMS E-X 750 PO) Take by mouth as needed.    [provider]  Cholecalciferol (VITAMIN D-3) 1000  UNITS CAPS Take 1 capsule by mouth daily.     [provider]  diphenhydrAMINE (BENADRYL) 25 MG tablet as needed. 12/25/21   [provider]  fexofenadine (ALLEGRA) 180 MG tablet Take by mouth as needed. 11/10/16   [provider]  fluticasone  (FLONASE ) 50 MCG/ACT nasal spray Place 2 sprays into both nostrils daily as needed for allergies or rhinitis. 09/16/18   Nichols, [name] S, NP  gabapentin (NEURONTIN) 100 MG capsule Take 100 mg by mouth at bedtime. 07/13/18   [provider]  ibuprofen (ADVIL,MOTRIN) 800 MG tablet Take 800 mg by mouth every 8 (eight) hours as needed.    [provider]  ketoconazole (NIZORAL) 2 % cream Apply 1 application topically 2 (two) times daily.    [provider]  Lancets (FREESTYLE) lancets as directed finger stick once a dayDX E11.69) 07/05/21   [provider]  Lancets (FREESTYLE) lancets as directed finger stick once a day prn for 90 days 07/03/21   [provider]  latanoprost (XALATAN) 0.005 % ophthalmic solution 1 drop at bedtime. 05/08/21   [provider]  Lifitegrast CARON) 5 % SOLN Place 1 drop into both eyes 2 times daily.    [provider]  metFORMIN (GLUCOPHAGE) 500 MG tablet Take 1,000 mg by mouth 2 (two) times daily with a meal.    [provider]  Multiple Vitamin (MULTIVITAMIN) tablet Take 1 tablet by mouth  daily.    [provider]  omeprazole  (PRILOSEC) 20 MG capsule Take 20 mg by mouth daily as needed (indigestion).  07/23/12   Brien Belvie BRAVO, MD  pravastatin (PRAVACHOL) 80 MG tablet Take 80 mg by mouth daily.    [provider]  SUMAtriptan  (IMITREX ) 50 MG tablet Take 1 tablet (50 mg total) by mouth every 2 (two) hours as needed for migraine. May repeat in 2 hours if headache persists or recurs. 09/07/18   Onita Duos, MD  tiZANidine (ZANAFLEX) 4 MG capsule Take by mouth as needed. 03/06/21   [provider]    Allergies: Sulfa  antibiotics, Penicillins, Indomethacin, and Robaxin  [methocarbamol ]    Review of Systems  Updated Vital Signs BP 123/70 (BP Location: Right Arm)   Pulse 78   Temp 99.1 F (37.3 C) (Oral)   Resp 18   SpO2 99%   Physical Exam Vitals and nursing note reviewed.  Constitutional:      Appearance: She is well-developed.  HENT:     Head: Normocephalic and atraumatic.  Eyes:     Pupils: Pupils are equal, round, and reactive to light.  Cardiovascular:     Pulses: Normal pulses. No decreased pulses.  Musculoskeletal:        General: Tenderness present.     Cervical back: Normal range of motion and neck supple.     Left hip: Tenderness present. No bony tenderness. Normal range of motion.     Left upper leg: No tenderness.     Left knee: No swelling or effusion. Normal range of motion. Tenderness present.     Left lower leg: No tenderness or bony tenderness.     Left ankle: Tenderness present. Normal range of motion.     Left foot: Normal range of motion. No tenderness.  Skin:    General: Skin is warm and dry.  Neurological:     Mental Status: She is alert.     Sensory: No sensory deficit.     Comments: Motor, sensation, and vascular distal to the injury is fully intact.   Psychiatric:        Mood and Affect: Mood normal.     (all labs ordered are listed, but only abnormal results are displayed) Labs Reviewed - No data to display  EKG: None  Radiology: No results found.   Procedures   Medications Ordered in the ED  oxyCODONE  (Oxy IR/ROXICODONE ) immediate release tablet 5 mg (has no administration in time range)   ED Course  Patient seen and examined. History obtained directly from patient.   Labs/EKG: Ordered CBG  Imaging: Ordered x-ray of the left ankle, knee, hip due to associated pain and tenderness on exam  Medications/Fluids: Ordered: Oxycodone  5 mg.   Most recent vital signs reviewed and are as follows: BP 123/70 (BP Location: Right Arm)   Pulse 78    Temp 99.1 F (37.3 C) (Oral)   Resp 18   SpO2 99%   Initial impression: Left leg pain, twisting injury.  History and physical exam not concerning for DVT at this time.  2:27 PM Reassessment performed. Patient appears stable.  Imaging personally visualized and interpreted including: X-ray of the hip, knee, ankle, agree no fractures.  Reviewed pertinent lab work and imaging with patient at bedside. Questions answered.   Most current vital signs reviewed and are as follows: BP 123/70 (BP Location: Right Arm)   Pulse 78   Temp 99.1 F (37.3 C) (Oral)   Resp 18   SpO2  99%   Plan: Discharge to home.  CBG was normal, 98.  Prescriptions written for: Medrol  Dosepak, Flexeril .  Patient counseled on proper use of muscle relaxant medication.  They were told not to drink alcohol, drive any vehicle, or do any dangerous activities while taking this medication.  Patient verbalized understanding.  Other home care instructions discussed: Rest, will provide with ASO for ankle as she seems to be particularly tender on the inside of the ankle, OTC meds, RICE protocol.  ED return instructions discussed: New or worsening symptoms.  Follow-up instructions discussed: Patient encouraged to follow-up with their PCP in 7 days.                                   Medical Decision Making Amount and/or Complexity of Data Reviewed Radiology: ordered.  Risk Prescription drug management.   Patient with recent twisting injury while on stairs.  She did not fall.  She has pain in the majority of her left leg.  X-ray of the hip, knee, ankle were negative today.  Pain seems musculoskeletal in nature.  Given her history of spinal stenosis, cannot rule out radicular pain as well.  Will trial Medrol  Dosepak, muscle relaxer.  If not improving, may need outpatient PCP follow-up.  Do not suspect DVT today with given history and lack of other convincing symptoms such as swelling.  Patient diabetic, but blood sugar is  normal today, well-controlled on metformin.     Final diagnoses:  Left leg pain  Acute left ankle pain    ED Discharge Orders          Ordered    methylPREDNISolone  (MEDROL  DOSEPAK) 4 MG TBPK tablet        03/10/24 1423    cyclobenzaprine  (FLEXERIL ) 5 MG tablet  2 times daily PRN        03/10/24 1423               Cortne Amara, PA-C 03/10/24 1430    Emil Share, DO 03/10/24 1452

## 2024-03-10 NOTE — ED Triage Notes (Signed)
 Pt presents with complaints of a mechanical fall last night. Reports left leg pain worsened in the knee and ankle. No LOC, no head injures. No thinners.  Took 1 gram tylenol  1 hr pta

## 2024-03-10 NOTE — Discharge Instructions (Addendum)
 Please read and follow all provided instructions.  Your diagnoses today include:  1. Left leg pain   2. Acute left ankle pain     Tests performed today include: X-rays of the left hip, knee, ankle showed some mild arthritis in the knee, no broken bones or other significant injuries. Vital signs. See below for your results today.   Medications prescribed:  Medrol  Dosepak: Steroid medication for nerve type pain  Flexeril  (cyclobenzaprine ) - muscle relaxer medication  DO NOT drive or perform any activities that require you to be awake and alert because this medicine can make you drowsy.   Take any prescribed medications only as directed.  Home care instructions:  Follow any educational materials contained in this packet Follow R.I.C.E. Protocol: R - rest your injury  I  - use ice on injury without applying directly to skin C - compress injury with bandage or splint E - elevate the injury as much as possible  Follow-up instructions: Please follow-up with your primary care provider if you continue to have significant pain in 1 week. In this case you may have a more severe injury that requires further care.   Return instructions:  Please return if your toes or feet are numb or tingling, appear gray or blue, or you have severe pain (also elevate the leg and loosen splint or wrap if you were given one) Please return to the Emergency Department if you experience worsening symptoms.  Please return if you have any other emergent concerns.  Additional Information:  Your vital signs today were: BP 123/70 (BP Location: Right Arm)   Pulse 78   Temp 99.1 F (37.3 C) (Oral)   Resp 18   SpO2 99%  If your blood pressure (BP) was elevated above 135/85 this visit, please have this repeated by your doctor within one month. --------------

## 2024-03-10 NOTE — ED Notes (Signed)
 Pt able to stand and pivot from locked wheelchair to lowered and locked stretcher x1 MIN assist for balance. Pt is not weight bearing on LLE.

## 2024-03-10 NOTE — ED Notes (Signed)
 ED Provider at bedside.

## 2024-04-06 ENCOUNTER — Telehealth: Payer: Self-pay

## 2024-04-06 NOTE — Telephone Encounter (Signed)
 Copied from CRM #[id number]. Topic: Medical Record Request - Other >> Apr 06, 2024  3:45 PM Taleah C wrote: Reason for CRM: pt called and asked for someone to confirm if pcp received the medical records sent from [facility] on [address]. Please call and advise.

## 2024-04-06 NOTE — Telephone Encounter (Signed)
 Spoke with patient, confirmed records were received in August.

## 2024-04-28 ENCOUNTER — Ambulatory Visit: Admitting: Family Medicine

## 2024-04-28 ENCOUNTER — Encounter: Payer: Self-pay | Admitting: Family Medicine

## 2024-04-28 VITALS — BP 120/88 | HR 64 | Temp 97.9°F | Ht 60.0 in | Wt 192.4 lb

## 2024-04-28 DIAGNOSIS — G4733 Obstructive sleep apnea (adult) (pediatric): Secondary | ICD-10-CM | POA: Diagnosis not present

## 2024-04-28 DIAGNOSIS — G43909 Migraine, unspecified, not intractable, without status migrainosus: Secondary | ICD-10-CM

## 2024-04-28 DIAGNOSIS — D862 Sarcoidosis of lung with sarcoidosis of lymph nodes: Secondary | ICD-10-CM | POA: Diagnosis not present

## 2024-04-28 DIAGNOSIS — E782 Mixed hyperlipidemia: Secondary | ICD-10-CM | POA: Diagnosis not present

## 2024-04-28 MED ORDER — COVID-19 MRNA VACC (MODERNA) 50 MCG/0.5ML IM SUSP: 0.5000 mL | Freq: Once | INTRAMUSCULAR | 0 refills | Status: AC

## 2024-04-28 NOTE — Assessment & Plan Note (Signed)
Chronic, stable Continue atorvastatin 

## 2024-04-28 NOTE — Assessment & Plan Note (Signed)
 Chronic, stable. Continue CPAP.

## 2024-04-28 NOTE — Assessment & Plan Note (Signed)
 Chronic, stable, f/u Dr. Jude Cont inhalers

## 2024-04-28 NOTE — Patient Instructions (Addendum)
 Welcome to [facility]!  Thank you for choosing us  for your Primary Care needs.   We offer in person and video appointments for your convenience. You may call our office to schedule appointments, or you may schedule appointments with me through MyChart.   The best way to get in contact with me is via MyChart message. This will get to me faster than a phone call, unless there is an emergency, then please call 911.  The lab is located downstairs in the Sports Medicine building, we also have xray available there.   Follow up with specialists as scheduled.   Follow up with me in a year for AWV and sooner if needed.

## 2024-04-28 NOTE — Progress Notes (Signed)
 New Patient Visit  Subjective:     Patient ID: Lindsey Copeland, female    DOB: 1961/05/29, 63 y.o.   MRN: [id number]  Chief Complaint  Patient presents with   Establish Care    HPI  63 year old female presents to establish care with me today.  She is a previous patient of [facility] [employer]. Has medical history significant for hypothyroidism, sarcoidosis, asthmatic bronchitis, sleep apnea, migraines, reflux, hyperlipidemia. Denies the need for refills today. States she received her flu vaccine for this year on 03/30/2024 at [facility]. Reports that she is eating well, sleeping well, feeling well overall. Denies other concerns today.    ROS Per HPI  Outpatient Encounter Medications as of 04/28/2024  Medication Sig   albuterol  (PROVENTIL ) (2.5 MG/3ML) 0.083% nebulizer solution Take 3 mLs (2.5 mg total) by nebulization every 6 (six) hours as needed for wheezing or shortness of breath (every 6 hours AND as needed for SOB).   albuterol  (VENTOLIN  HFA) 108 (90 Base) MCG/ACT inhaler USE 2 INHALATIONS EVERY 6 HOURS AS NEEDED FOR WHEEZING OR SHORTNESS OF BREATH   aspirin 81 MG tablet Take 81 mg by mouth daily.   benzonatate  (TESSALON ) 100 MG capsule Take 1 capsule (100 mg total) by mouth 3 (three) times daily as needed.   Blood Glucose Monitoring Suppl (FREESTYLE FREEDOM LITE) w/Device KIT as directed finger stick once a day (DX e11.69   brimonidine (ALPHAGAN) 0.15 % ophthalmic solution    budesonide -formoterol  (SYMBICORT ) 160-4.5 MCG/ACT inhaler INHALE 2 PUFFS BY MOUTH TWICE DAILY   Calcium Carbonate Antacid (TUMS E-X 750 PO) Take by mouth as needed.   Cholecalciferol (VITAMIN D-3) 1000 UNITS CAPS Take 1 capsule by mouth daily.    COVID-19 mRNA vaccine, Moderna, >/= 60yrs, (SPIKEVAX) injection Inject 0.5 mLs into the muscle once for 1 dose. Inject 0.5mL into the muscle once for once dose   cyclobenzaprine  (FLEXERIL ) 5 MG tablet Take 1 tablet (5 mg total) by mouth 2  (two) times daily as needed for muscle spasms.   diphenhydrAMINE (BENADRYL) 25 MG tablet as needed.   fexofenadine (ALLEGRA) 180 MG tablet Take by mouth as needed.   fluticasone  (FLONASE ) 50 MCG/ACT nasal spray Place 2 sprays into both nostrils daily as needed for allergies or rhinitis.   gabapentin (NEURONTIN) 100 MG capsule Take 100 mg by mouth at bedtime.   ibuprofen (ADVIL,MOTRIN) 800 MG tablet Take 800 mg by mouth every 8 (eight) hours as needed.   ketoconazole (NIZORAL) 2 % cream Apply 1 application topically 2 (two) times daily.   Lancets (FREESTYLE) lancets as directed finger stick once a dayDX E11.69)   Lancets (FREESTYLE) lancets as directed finger stick once a day prn for 90 days   latanoprost (XALATAN) 0.005 % ophthalmic solution 1 drop at bedtime.   Lifitegrast (XIIDRA) 5 % SOLN Place 1 drop into both eyes 2 times daily.   metFORMIN (GLUCOPHAGE) 500 MG tablet Take 1,000 mg by mouth 2 (two) times daily with a meal.   Multiple Vitamin (MULTIVITAMIN) tablet Take 1 tablet by mouth daily.   omeprazole  (PRILOSEC) 20 MG capsule Take 20 mg by mouth daily as needed (indigestion).    pravastatin (PRAVACHOL) 80 MG tablet Take 80 mg by mouth daily.   SUMAtriptan  (IMITREX ) 50 MG tablet Take 1 tablet (50 mg total) by mouth every 2 (two) hours as needed for migraine. May repeat in 2 hours if headache persists or recurs.   [DISCONTINUED] methylPREDNISolone  (MEDROL  DOSEPAK) 4 MG TBPK tablet Take as  directed on the packaging. (Patient not taking: Reported on 04/28/2024)   No facility-administered encounter medications on file as of 04/28/2024.    Past Medical History:  Diagnosis Date   Arthritis    Asthma    Carpal tunnel syndrome    COPD (chronic obstructive pulmonary disease) (HCC)    sarcoidosis   Dysrhythmia    palpitations   GERD (gastroesophageal reflux disease)    Graves disease    Hyperlipidemia    Hyperthyroidism    Leaky heart valve    Lumbar spinal stenosis    Sarcoidosis     Sleep apnea    CPAP   Type 2 diabetes mellitus (HCC)    Vitamin D deficiency     Past Surgical History:  Procedure Laterality Date   CARPAL TUNNEL RELEASE Right 02/20/2015   Procedure: RIGHT CARPAL TUNNEL RELEASE;  Surgeon: Arley Curia, MD;  Location: [facility];  Service: Orthopedics;  Laterality: Right;  ANESTHESIA:  IV REGIONAL FAB   CARPAL TUNNEL RELEASE Left 10/09/2015   Procedure: LEFT CARPAL TUNNEL RELEASE;  Surgeon: Arley Curia, MD;  Location: [facility];  Service: Orthopedics;  Laterality: Left;   radioactive iodine treatment     right knee arthroscopy     SHOULDER ARTHROSCOPY     TUBAL LIGATION     ULNAR NERVE REPAIR     VIDEO BRONCHOSCOPY  07/30/2012   Procedure: VIDEO BRONCHOSCOPY WITH FLUORO;  Surgeon: Belvie FORBES Silvan, MD;  Location: [facility];  Service: Cardiopulmonary;  Laterality: N/A;    Family History  Problem Relation Age of Onset   Asthma Brother    Diabetes Mother    Heart failure Mother    Glaucoma Mother    Heart attack Brother    Diabetes Sister    Breast cancer Sister    Pancreatic cancer Maternal Aunt    Colon cancer Neg Hx    Stomach cancer Neg Hx     Social History   Socioeconomic History   Marital status: Married    Spouse name: Not on file   Number of children: 3   Years of education: Not on file   Highest education level: Not on file  Occupational History   Occupation: child psychotherapist  Tobacco Use   Smoking status: Former    Current packs/day: 0.00    Average packs/day: 0.1 packs/day for 10.0 years (1.0 ttl pk-yrs)    Types: Cigarettes    Start date: 06/30/1992    Quit date: 06/30/2002    Years since quitting: 21.8   Smokeless tobacco: Never  Vaping Use   Vaping status: Never Used  Substance and Sexual Activity   Alcohol use: Yes    Comment: occasionally   Drug use: No   Sexual activity: Not on file  Other Topics Concern   Not on file  Social History Narrative   Not on file   Social Drivers of  Health   Financial Resource Strain: Not on file  Food Insecurity: Not on file  Transportation Needs: Not on file  Physical Activity: Not on file  Stress: Not on file  Social Connections: Not on file  Intimate Partner Violence: Not on file       Objective:    BP 120/88 (BP Location: Left Arm, Patient Position: Sitting)   Pulse 64   Temp 97.9 F (36.6 C) (Temporal)   Ht 5' (1.524 m)   Wt 192 lb 6.4 oz (87.3 kg)   SpO2 94%   BMI 37.58 kg/m  Physical Exam Vitals and nursing note reviewed.  Constitutional:      General: She is not in acute distress.    Appearance: Normal appearance. She is obese.  HENT:     Head: Normocephalic and atraumatic.     Right Ear: External ear normal.     Left Ear: External ear normal.     Nose: Nose normal.     Mouth/Throat:     Mouth: Mucous membranes are moist.     Pharynx: Oropharynx is clear.  Eyes:     Extraocular Movements: Extraocular movements intact.     Pupils: Pupils are equal, round, and reactive to light.  Cardiovascular:     Rate and Rhythm: Normal rate and regular rhythm.     Pulses: Normal pulses.     Heart sounds: Normal heart sounds.  Pulmonary:     Effort: Pulmonary effort is normal. No respiratory distress.     Breath sounds: Normal breath sounds. No wheezing, rhonchi or rales.  Musculoskeletal:        General: Normal range of motion.     Cervical back: Normal range of motion.     Right lower leg: No edema.     Left lower leg: No edema.  Lymphadenopathy:     Cervical: No cervical adenopathy.  Neurological:     General: No focal deficit present.     Mental Status: She is alert and oriented to person, place, and time.  Psychiatric:        Mood and Affect: Mood normal.        Thought Content: Thought content normal.     No results found for any visits on 04/28/24.      Assessment & Plan:   OSA (obstructive sleep apnea) Assessment & Plan: Chronic, stable Continue CPAP  Orders: -     COVID-19 mRNA Vacc  (Moderna); Inject 0.5 mLs into the muscle once for 1 dose. 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 Jude Cont inhalers  Orders: -     COVID-19 mRNA Vacc (Moderna); Inject 0.5 mLs into the muscle once for 1 dose. Inject 0.5mL into the muscle once for once dose  Dispense: 0.5 mL; Refill: 0  Migraine without status migrainosus, not intractable, unspecified migraine type Assessment & Plan: Stable Continue Imitrex  as needed   Mixed hyperlipidemia Assessment & Plan: Chronic, stable Continue atorvastatin       No orders of the defined types were placed in this encounter.    Meds ordered this encounter  Medications   COVID-19 mRNA vaccine, Moderna, >/= 36yrs, (SPIKEVAX) injection    Sig: Inject 0.5 mLs into the muscle once for 1 dose. Inject 0.5mL into the muscle once for once dose    Dispense:  0.5 mL    Refill:  0    Substitute brand per preference/availability    Return in about 1 year (around 04/28/2025).  Corean LITTIE Ku, FNP

## 2024-04-28 NOTE — Assessment & Plan Note (Signed)
Stable.  Continue Imitrex as needed. 

## 2024-05-02 ENCOUNTER — Other Ambulatory Visit (HOSPITAL_BASED_OUTPATIENT_CLINIC_OR_DEPARTMENT_OTHER): Payer: Self-pay | Admitting: Pulmonary Disease

## 2024-05-02 MED ORDER — BUDESONIDE-FORMOTEROL FUMARATE 160-4.5 MCG/ACT IN AERO: INHALATION_SPRAY | RESPIRATORY_TRACT | 3 refills | Status: AC

## 2024-05-02 NOTE — Telephone Encounter (Signed)
 Copied from CRM [id number]. Topic: Clinical - Medication Refill >> May 02, 2024  8:43 AM Leotis ORN wrote: Medication: budesonide -formoterol  (SYMBICORT ) 160-4.5 MCG/ACT inhaler  Has the patient contacted their pharmacy? Yes 0 refills pharm advised patient to call provider  Last OV- 07/30/23 PATIENT DENIED SCHEDULING stated she would call back at a later date   This is the patient's preferred pharmacy:  [facility] - [place], [place] - [address] [place] [address] Phone: [phone] Fax: [phone]   Is this the correct pharmacy for this prescription? Yes If no, delete pharmacy and type the correct one.   Has the prescription been filled recently? Yes  Is the patient out of the medication? Yes  Has the patient been seen for an appointment in the last year OR does the patient have an upcoming appointment? Yes   Can we respond through MyChart? Yes  Agent: Please be advised that Rx refills may take up to 3 business days. We ask that you follow-up with your pharmacy.

## 2024-06-02 ENCOUNTER — Encounter: Payer: Self-pay | Admitting: Emergency Medicine

## 2024-06-02 ENCOUNTER — Ambulatory Visit: Admitting: Emergency Medicine

## 2024-06-02 VITALS — BP 126/80 | HR 77 | Temp 98.5°F | Ht 60.0 in | Wt 192.0 lb

## 2024-06-02 DIAGNOSIS — R0981 Nasal congestion: Secondary | ICD-10-CM | POA: Insufficient documentation

## 2024-06-02 DIAGNOSIS — R42 Dizziness and giddiness: Secondary | ICD-10-CM | POA: Diagnosis not present

## 2024-06-02 DIAGNOSIS — J019 Acute sinusitis, unspecified: Secondary | ICD-10-CM

## 2024-06-02 DIAGNOSIS — B9689 Other specified bacterial agents as the cause of diseases classified elsewhere: Secondary | ICD-10-CM | POA: Insufficient documentation

## 2024-06-02 MED ORDER — AZITHROMYCIN 250 MG PO TABS: ORAL_TABLET | ORAL | 0 refills | Status: AC

## 2024-06-02 MED ORDER — MECLIZINE HCL 25 MG PO TABS: 25.0000 mg | ORAL_TABLET | Freq: Three times a day (TID) | ORAL | 0 refills | Status: AC | PRN

## 2024-06-02 NOTE — Assessment & Plan Note (Signed)
 As a result of a bacterial sinus infection and sinus congestion Symptom management discussed Advised to rest and stay well-hydrated Recommend Antivert 25 mg 3 times a day as needed.

## 2024-06-02 NOTE — Assessment & Plan Note (Signed)
 Recommend over-the-counter Mucinex DM Nasal saline sprays frequently during the day Tylenol  and or Advil as needed for headaches

## 2024-06-02 NOTE — Assessment & Plan Note (Signed)
 Clinically stable.  No red flag signs or symptoms. Upper viral respiratory infection now with secondary bacterial infection Allergic to penicillin.  Recommend azithromycin  daily for 5 days. Symptom management discussed Advised to rest and stay well-hydrated Advised to contact the office if no better or worse during the next several days

## 2024-06-02 NOTE — Patient Instructions (Signed)

## 2024-06-02 NOTE — Progress Notes (Signed)
 Lindsey Copeland 63 y.o.   Chief Complaint  Patient presents with   Dizziness    Pt states that she has experience this , she is also having head cold she has a lot of congestion on her nose and bother her eyes    HISTORY OF PRESENT ILLNESS: Acute problem visit today. This is a 63 y.o. female complaining of sinus pressure and headache that started about 3 days ago followed by dizziness mostly in the morning and in the evening No other associated symptoms No other complaints or medical concerns today  Dizziness Associated symptoms include congestion and headaches. Pertinent negatives include no abdominal pain, chest pain, chills, coughing, fever, nausea, rash or vomiting.     Prior to Admission medications   Medication Sig Start Date End Date Taking? Authorizing Provider  albuterol  (PROVENTIL ) (2.5 MG/3ML) 0.083% nebulizer solution Take 3 mLs (2.5 mg total) by nebulization every 6 (six) hours as needed for wheezing or shortness of breath (every 6 hours AND as needed for SOB). 08/27/20  Yes Jude Harden GAILS, MD  albuterol  (VENTOLIN  HFA) 108 (90 Base) MCG/ACT inhaler USE 2 INHALATIONS EVERY 6 HOURS AS NEEDED FOR WHEEZING OR SHORTNESS OF BREATH 02/05/24  Yes Jude Harden GAILS, MD  aspirin 81 MG tablet Take 81 mg by mouth daily.   Yes [provider]  azithromycin  (ZITHROMAX ) 250 MG tablet Take 2 tablets on day 1, then 1 tablet daily on days 2 through 5 06/02/24 06/07/24 Yes Mikka Kissner, Emil Schanz, MD  benzonatate  (TESSALON ) 100 MG capsule Take 1 capsule (100 mg total) by mouth 3 (three) times daily as needed. 07/30/23  Yes Jude Harden GAILS, MD  Blood Glucose Monitoring Suppl (FREESTYLE FREEDOM LITE) w/Device KIT as directed finger stick once a day (DX e11.69 07/05/21  Yes [provider]  brimonidine (ALPHAGAN) 0.15 % ophthalmic solution  05/08/21  Yes [provider]  budesonide -formoterol  (SYMBICORT ) 160-4.5 MCG/ACT inhaler INHALE 2 PUFFS BY MOUTH TWICE DAILY 05/02/24  Yes Alva,  Rakesh V, MD  Calcium Carbonate Antacid (TUMS E-X 750 PO) Take by mouth as needed.   Yes [provider]  Cholecalciferol (VITAMIN D-3) 1000 UNITS CAPS Take 1 capsule by mouth daily.    Yes [provider]  diphenhydrAMINE (BENADRYL) 25 MG tablet as needed. 12/25/21  Yes [provider]  fexofenadine (ALLEGRA) 180 MG tablet Take by mouth as needed. 11/10/16  Yes [provider]  fluticasone  (FLONASE ) 50 MCG/ACT nasal spray Place 2 sprays into both nostrils daily as needed for allergies or rhinitis. 09/16/18  Yes Nichols, [name] S, NP  gabapentin (NEURONTIN) 100 MG capsule Take 100 mg by mouth at bedtime. 07/13/18  Yes [provider]  ibuprofen (ADVIL,MOTRIN) 800 MG tablet Take 800 mg by mouth every 8 (eight) hours as needed.   Yes [provider]  ketoconazole (NIZORAL) 2 % cream Apply 1 application topically 2 (two) times daily.   Yes [provider]  Lancets (FREESTYLE) lancets as directed finger stick once a dayDX E11.69) 07/05/21  Yes [provider]  Lancets (FREESTYLE) lancets as directed finger stick once a day prn for 90 days 07/03/21  Yes [provider]  latanoprost (XALATAN) 0.005 % ophthalmic solution 1 drop at bedtime. 05/08/21  Yes [provider]  Lifitegrast CARON) 5 % SOLN Place 1 drop into both eyes 2 times daily.   Yes [provider]  meclizine (ANTIVERT) 25 MG tablet Take 1 tablet (25 mg total) by mouth 3 (three) times daily as needed for  dizziness. 06/02/24  Yes Jamille Fisher Jose, MD  metFORMIN (GLUCOPHAGE) 500 MG tablet Take 1,000 mg by mouth 2 (two) times daily with a meal.   Yes [provider]  Multiple Vitamin (MULTIVITAMIN) tablet Take 1 tablet by mouth daily.   Yes [provider]  omeprazole  (PRILOSEC) 20 MG capsule Take 20 mg by mouth daily as needed (indigestion).  07/23/12  Yes Brien Belvie BRAVO, MD  pravastatin (PRAVACHOL) 80 MG tablet Take 80 mg by mouth  daily.   Yes [provider]  SUMAtriptan  (IMITREX ) 50 MG tablet Take 1 tablet (50 mg total) by mouth every 2 (two) hours as needed for migraine. May repeat in 2 hours if headache persists or recurs. 09/07/18  Yes Onita Duos, MD  cyclobenzaprine  (FLEXERIL ) 5 MG tablet Take 1 tablet (5 mg total) by mouth 2 (two) times daily as needed for muscle spasms. Patient not taking: Reported on 06/02/2024 03/10/24   Desiderio Chew, PA-C    Allergies  Allergen Reactions   Sulfa Antibiotics Hives    hives   Penicillins Itching   Indomethacin Palpitations   Robaxin  [Methocarbamol ] Palpitations    Patient Active Problem List   Diagnosis Date Noted   Acute bacterial sinusitis 06/02/2024   Sinus congestion 06/02/2024   Dizziness 06/02/2024   Asthmatic bronchitis with acute exacerbation 04/10/2021   Asthma, persistent controlled 08/27/2020   Morbid obesity (HCC) 08/08/2019   Exposure to bat without known bite 03/15/2018   Migraine headache 03/03/2018   Family history of early CAD 09/22/2017   Breast lesion 04/18/2016   Depression 04/18/2016   OSA (obstructive sleep apnea) 01/27/2013   GERD (gastroesophageal reflux disease) 12/15/2012   Mixed hyperlipidemia 12/15/2012   Sarcoidosis of lung with sarcoidosis of lymph nodes 07/23/2012   Hyperthyroidism 01/06/2012   Graves' disease 01/06/2012    Past Medical History:  Diagnosis Date   Arthritis    Asthma    Carpal tunnel syndrome    COPD (chronic obstructive pulmonary disease) (HCC)    sarcoidosis   Dysrhythmia    palpitations   GERD (gastroesophageal reflux disease)    Graves disease    Hyperlipidemia    Hyperthyroidism    Leaky heart valve    Lumbar spinal stenosis    Sarcoidosis    Sleep apnea    CPAP   Type 2 diabetes mellitus (HCC)    Vitamin D deficiency     Past Surgical History:  Procedure Laterality Date   CARPAL TUNNEL RELEASE Right 02/20/2015   Procedure: RIGHT CARPAL TUNNEL RELEASE;  Surgeon: Arley Curia, MD;   Location: [facility];  Service: Orthopedics;  Laterality: Right;  ANESTHESIA:  IV REGIONAL FAB   CARPAL TUNNEL RELEASE Left 10/09/2015   Procedure: LEFT CARPAL TUNNEL RELEASE;  Surgeon: Arley Curia, MD;  Location:  SURGERY CENTER;  Service: Orthopedics;  Laterality: Left;   radioactive iodine treatment     right knee arthroscopy     SHOULDER ARTHROSCOPY     TUBAL LIGATION     ULNAR NERVE REPAIR     VIDEO BRONCHOSCOPY  07/30/2012   Procedure: VIDEO BRONCHOSCOPY WITH FLUORO;  Surgeon: Belvie BRAVO Brien, MD;  Location: [facility];  Service: Cardiopulmonary;  Laterality: N/A;    Social History   Socioeconomic History   Marital status: Married    Spouse name: Not on file   Number of children: 3   Years of education: Not on file   Highest education level: Not on file  Occupational History  Occupation: child psychotherapist  Tobacco Use   Smoking status: Former    Current packs/day: 0.00    Average packs/day: 0.1 packs/day for 10.0 years (1.0 ttl pk-yrs)    Types: Cigarettes    Start date: 06/30/1992    Quit date: 06/30/2002    Years since quitting: 21.9   Smokeless tobacco: Never  Vaping Use   Vaping status: Never Used  Substance and Sexual Activity   Alcohol use: Yes    Comment: occasionally   Drug use: No   Sexual activity: Not on file  Other Topics Concern   Not on file  Social History Narrative   Not on file   Social Drivers of Health   Financial Resource Strain: Not on file  Food Insecurity: Not on file  Transportation Needs: Not on file  Physical Activity: Not on file  Stress: Not on file  Social Connections: Not on file  Intimate Partner Violence: Not on file    Family History  Problem Relation Age of Onset   Asthma Brother    Diabetes Mother    Heart failure Mother    Glaucoma Mother    Heart attack Brother    Diabetes Sister    Breast cancer Sister    Pancreatic cancer Maternal Aunt    Colon cancer Neg Hx    Stomach cancer Neg Hx       Review of Systems  Constitutional: Negative.  Negative for chills and fever.  HENT:  Positive for congestion and sinus pain.   Respiratory: Negative.  Negative for cough and shortness of breath.   Cardiovascular: Negative.  Negative for chest pain and palpitations.  Gastrointestinal:  Negative for abdominal pain, diarrhea, nausea and vomiting.  Genitourinary: Negative.  Negative for dysuria and hematuria.  Skin:  Negative for rash.  Neurological:  Positive for dizziness and headaches.  All other systems reviewed and are negative.   Vitals:   06/02/24 1323  BP: 126/80  Pulse: 77  Temp: 98.5 F (36.9 C)  SpO2: 99%    Physical Exam Vitals reviewed.  Constitutional:      Appearance: Normal appearance.  HENT:     Head: Normocephalic.     Nose: Congestion present.     Mouth/Throat:     Mouth: Mucous membranes are moist.     Pharynx: Oropharynx is clear.  Eyes:     Extraocular Movements: Extraocular movements intact.     Conjunctiva/sclera: Conjunctivae normal.     Pupils: Pupils are equal, round, and reactive to light.  Cardiovascular:     Rate and Rhythm: Normal rate and regular rhythm.     Pulses: Normal pulses.     Heart sounds: Normal heart sounds.  Pulmonary:     Effort: Pulmonary effort is normal.     Breath sounds: Normal breath sounds.  Musculoskeletal:     Cervical back: No tenderness.  Lymphadenopathy:     Cervical: No cervical adenopathy.  Skin:    General: Skin is warm and dry.     Capillary Refill: Capillary refill takes less than 2 seconds.  Neurological:     General: No focal deficit present.     Mental Status: She is alert and oriented to person, place, and time.  Psychiatric:        Mood and Affect: Mood normal.        Behavior: Behavior normal.      ASSESSMENT & PLAN: Problem List Items Addressed This Visit       Respiratory   Acute  bacterial sinusitis - Primary   Clinically stable.  No red flag signs or symptoms. Upper viral  respiratory infection now with secondary bacterial infection Allergic to penicillin.  Recommend azithromycin  daily for 5 days. Symptom management discussed Advised to rest and stay well-hydrated Advised to contact the office if no better or worse during the next several days      Relevant Medications   azithromycin  (ZITHROMAX ) 250 MG tablet   Sinus congestion   Recommend over-the-counter Mucinex DM Nasal saline sprays frequently during the day Tylenol  and or Advil as needed for headaches        Other   Dizziness   As a result of a bacterial sinus infection and sinus congestion Symptom management discussed Advised to rest and stay well-hydrated Recommend Antivert  25 mg 3 times a day as needed.      Relevant Medications   meclizine  (ANTIVERT ) 25 MG tablet   Patient Instructions  Sinus Infection, Adult A sinus infection is soreness and swelling (inflammation) of your sinuses. Sinuses are hollow spaces in the bones around your face. They are located: Around your eyes. In the middle of your forehead. Behind your nose. In your cheekbones. Your sinuses and nasal passages are lined with a fluid called mucus. Mucus drains out of your sinuses. Swelling can trap mucus in your sinuses. This lets germs (bacteria, virus, or fungus) grow, which leads to infection. Most of the time, this condition is caused by a virus. What are the causes? Allergies. Asthma. Germs. Things that block your nose or sinuses. Growths in the nose (nasal polyps). Chemicals or irritants in the air. A fungus. This is rare. What increases the risk? Having a weak body defense system (immune system). Doing a lot of swimming or diving. Using nasal sprays too much. Smoking. What are the signs or symptoms? The main symptoms of this condition are pain and a feeling of pressure around the sinuses. Other symptoms include: Stuffy nose (congestion). This may make it hard to breathe through your nose. Runny nose  (drainage). Soreness, swelling, and warmth in the sinuses. A cough that may get worse at night. Being unable to smell and taste. Mucus that collects in the throat or the back of the nose (postnasal drip). This may cause a sore throat or bad breath. Being very tired (fatigued). A fever. How is this diagnosed? Your symptoms. Your medical history. A physical exam. Tests to find out if your condition is short-term (acute) or long-term (chronic). Your doctor may: Check your nose for growths (polyps). Check your sinuses using a tool that has a light on one end (endoscope). Check for allergies or germs. Do imaging tests, such as an MRI or CT scan. How is this treated? Treatment for this condition depends on the cause and whether it is short-term or long-term. If caused by a virus, your symptoms should go away on their own within 10 days. You may be given medicines to relieve symptoms. They include: Medicines that shrink swollen tissue in the nose. A spray that treats swelling of the nostrils. Rinses that help get rid of thick mucus in your nose (nasal saline washes). Medicines that treat allergies (antihistamines). Over-the-counter pain relievers. If caused by bacteria, your doctor may wait to see if you will get better without treatment. You may be given antibiotic medicine if you have: A very bad infection. A weak body defense system. If caused by growths in the nose, surgery may be needed. Follow these instructions at home: Medicines Take, use, or apply  over-the-counter and prescription medicines only as told by your doctor. These may include nasal sprays. If you were prescribed an antibiotic medicine, take it as told by your doctor. Do not stop taking it even if you start to feel better. Hydrate and humidify  Drink enough water to keep your pee (urine) pale yellow. Use a cool mist humidifier to keep the humidity level in your home above 50%. Breathe in steam for 10-15 minutes, 3-4  times a day, or as told by your doctor. You can do this in the bathroom while a hot shower is running. Try not to spend time in cool or dry air. Rest Rest as much as you can. Sleep with your head raised (elevated). Make sure you get enough sleep each night. General instructions  Put a warm, moist washcloth on your face 3-4 times a day, or as often as told by your doctor. Use nasal saline washes as often as told by your doctor. Wash your hands often with soap and water. If you cannot use soap and water, use hand sanitizer. Do not smoke. Avoid being around people who are smoking (secondhand smoke). Keep all follow-up visits. Contact a doctor if: You have a fever. Your symptoms get worse. Your symptoms do not get better within 10 days. Get help right away if: You have a very bad headache. You cannot stop vomiting. You have very bad pain or swelling around your face or eyes. You have trouble seeing. You feel confused. Your neck is stiff. You have trouble breathing. These symptoms may be an emergency. Get help right away. Call 911. Do not wait to see if the symptoms will go away. Do not drive yourself to the hospital. Summary A sinus infection is swelling of your sinuses. Sinuses are hollow spaces in the bones around your face. This condition is caused by tissues in your nose that become inflamed or swollen. This traps germs. These can lead to infection. If you were prescribed an antibiotic medicine, take it as told by your doctor. Do not stop taking it even if you start to feel better. Keep all follow-up visits. This information is not intended to replace advice given to you by your health care provider. Make sure you discuss any questions you have with your health care provider. Document Revised: 05/21/2021 Document Reviewed: 05/21/2021 Elsevier Patient Education  2024 Elsevier Inc.    Emil Schaumann, MD [facility]

## 2024-07-27 ENCOUNTER — Telehealth: Payer: Self-pay

## 2024-07-27 NOTE — Telephone Encounter (Signed)
 Copied from CRM #[id number]. Topic: Clinical - Medication Question >> Jul 27, 2024  3:27 PM Burnard DEL wrote: Reason for CRM: Patient called in stating that her husband has been diagnosed with the flu and would like to know if she could be prescribed any tamiflu . She stated that she is having headache,sinus pain,and a little dizziness. She would like tamiflu  to help get ahead of thee symptoms.   [id number] - [place], [place] - [address] AT [facility] OF [address]  Phone: [phone] Fax: [phone]

## 2024-07-28 ENCOUNTER — Ambulatory Visit: Payer: Self-pay | Admitting: Family Medicine

## 2024-07-28 DIAGNOSIS — Z20828 Contact with and (suspected) exposure to other viral communicable diseases: Secondary | ICD-10-CM

## 2024-07-28 DIAGNOSIS — R6889 Other general symptoms and signs: Secondary | ICD-10-CM

## 2024-07-28 MED ORDER — OSELTAMIVIR PHOSPHATE 75 MG PO CAPS: 75.0000 mg | ORAL_CAPSULE | Freq: Two times a day (BID) | ORAL | 0 refills | Status: AC

## 2024-07-28 NOTE — Telephone Encounter (Signed)
 FYI Only or Action Required?: Action required by provider: ED refusal.  Patient was last seen in primary care on 06/02/2024 by Purcell Emil Schanz, MD.  Called Nurse Triage reporting Influenza.  Symptoms began 3 days ago.  Interventions attempted: Prescription medications: Symbicort  2 puffs bid, albuterol  inhaler once daily, albuterol  once daily .  Symptoms are: unchanged.  Triage Disposition: Go to ED Now (Notify PCP)  Patient/caregiver understands and will follow disposition?: Yes                 Message from [facility] C sent at 07/28/2024 12:07 PM EST  Reason for Triage: Patient states she called yesterday with symptoms of headaches, eye pain,dizziness , and a crm was sent to clinic asking for Tamiflu . Patient is still having pretty painful headache, sharp eye pain,dizziness and fever.   Reason for Disposition  Chest pain (Exception: Mild central chest pain, present only when coughing.)  Answer Assessment - Initial Assessment Questions Patient had flu exposure from husband: 3 days ago husband tested positive for flu. Her symptoms started 3 days ago: Sinus/nasal aches, runny nose, eye pain, headache, ears popping intermittent, dizziness, fatigue, occasional dry cough. RN asking emergent symptoms and patient reports: Mild chest pain (separate, not when coughing). Lasted 15-20 minutes. Resolved after drinking tea   No fever. Not severe SOB, speaking in full sentences. Using: Symbicort  2 puffs bid, albuterol  inhaler once daily, albuterol  nebulizer once daily  RN advised ED, patient refused. Called [facility] and notified staff member Diane.  Protocols used: Influenza (Flu) Suspected-A-AH

## 2024-07-28 NOTE — Telephone Encounter (Signed)
 PCP gave verbal permission to send in Tamiflu .  Sending to pcp for FYI.

## 2024-07-28 NOTE — Telephone Encounter (Signed)
 LVM for patient. Need to know how long she has been feeling bad

## 2024-07-28 NOTE — Telephone Encounter (Signed)
 Please see nurse triage today, for update on patient's symptoms.
